# Patient Record
Sex: Female | Born: 1955
Health system: Southern US, Community
[De-identification: ages and names within clinical notes are randomized; demographics above are authoritative.]

## PROBLEM LIST (undated history)

## (undated) ENCOUNTER — Ambulatory Visit: Admission: EM | Payer: Medicare (Managed Care)

## (undated) DIAGNOSIS — I10 Essential (primary) hypertension: Secondary | ICD-10-CM

## (undated) DIAGNOSIS — I639 Cerebral infarction, unspecified: Secondary | ICD-10-CM

## (undated) DIAGNOSIS — E119 Type 2 diabetes mellitus without complications: Secondary | ICD-10-CM

## (undated) DIAGNOSIS — I671 Cerebral aneurysm, nonruptured: Secondary | ICD-10-CM

## (undated) HISTORY — DX: Type 2 diabetes mellitus without complications: E11.9

## (undated) HISTORY — DX: Cerebral infarction, unspecified: I63.9

## (undated) HISTORY — PX: CHOLECYSTECTOMY: SHX55

## (undated) HISTORY — PX: TOTAL ABDOMINAL HYSTERECTOMY: SHX209

## (undated) HISTORY — PX: ABDOMINAL HYSTERECTOMY: SHX81

---

## 2002-05-31 ENCOUNTER — Emergency Department (HOSPITAL_COMMUNITY): Admission: EM | Admit: 2002-05-31 | Discharge: 2002-05-31 | Payer: Self-pay | Admitting: Emergency Medicine

## 2003-05-15 ENCOUNTER — Emergency Department (HOSPITAL_COMMUNITY): Admission: EM | Admit: 2003-05-15 | Discharge: 2003-05-15 | Payer: Self-pay | Admitting: Physical Therapy

## 2003-05-15 ENCOUNTER — Encounter: Payer: Self-pay | Admitting: *Deleted

## 2004-01-23 ENCOUNTER — Emergency Department (HOSPITAL_COMMUNITY): Admission: EM | Admit: 2004-01-23 | Discharge: 2004-01-24 | Payer: Self-pay | Admitting: Emergency Medicine

## 2004-07-28 ENCOUNTER — Emergency Department (HOSPITAL_COMMUNITY): Admission: EM | Admit: 2004-07-28 | Discharge: 2004-07-28 | Payer: Self-pay | Admitting: Family Medicine

## 2005-05-19 ENCOUNTER — Emergency Department (HOSPITAL_COMMUNITY): Admission: AD | Admit: 2005-05-19 | Discharge: 2005-05-19 | Payer: Self-pay | Admitting: Family Medicine

## 2005-09-04 ENCOUNTER — Emergency Department (HOSPITAL_COMMUNITY): Admission: EM | Admit: 2005-09-04 | Discharge: 2005-09-04 | Payer: Self-pay | Admitting: Family Medicine

## 2006-03-06 ENCOUNTER — Emergency Department (HOSPITAL_COMMUNITY): Admission: EM | Admit: 2006-03-06 | Discharge: 2006-03-06 | Payer: Self-pay | Admitting: *Deleted

## 2006-03-06 ENCOUNTER — Emergency Department (HOSPITAL_COMMUNITY): Admission: EM | Admit: 2006-03-06 | Discharge: 2006-03-06 | Payer: Self-pay | Admitting: Emergency Medicine

## 2006-08-13 ENCOUNTER — Emergency Department (HOSPITAL_COMMUNITY): Admission: EM | Admit: 2006-08-13 | Discharge: 2006-08-13 | Payer: Self-pay | Admitting: Family Medicine

## 2006-09-06 ENCOUNTER — Emergency Department (HOSPITAL_COMMUNITY): Admission: EM | Admit: 2006-09-06 | Discharge: 2006-09-06 | Payer: Self-pay | Admitting: Emergency Medicine

## 2007-01-28 ENCOUNTER — Emergency Department (HOSPITAL_COMMUNITY): Admission: EM | Admit: 2007-01-28 | Discharge: 2007-01-28 | Payer: Self-pay | Admitting: Family Medicine

## 2007-06-29 ENCOUNTER — Emergency Department (HOSPITAL_COMMUNITY): Admission: EM | Admit: 2007-06-29 | Discharge: 2007-06-29 | Payer: Self-pay | Admitting: *Deleted

## 2007-08-05 ENCOUNTER — Emergency Department (HOSPITAL_COMMUNITY): Admission: EM | Admit: 2007-08-05 | Discharge: 2007-08-06 | Payer: Self-pay | Admitting: Emergency Medicine

## 2008-01-07 ENCOUNTER — Emergency Department (HOSPITAL_COMMUNITY): Admission: EM | Admit: 2008-01-07 | Discharge: 2008-01-07 | Payer: Self-pay | Admitting: Emergency Medicine

## 2008-08-25 ENCOUNTER — Emergency Department (HOSPITAL_COMMUNITY): Admission: EM | Admit: 2008-08-25 | Discharge: 2008-08-25 | Payer: Self-pay | Admitting: Emergency Medicine

## 2009-08-10 ENCOUNTER — Emergency Department (HOSPITAL_COMMUNITY): Admission: EM | Admit: 2009-08-10 | Discharge: 2009-08-11 | Payer: Self-pay | Admitting: Emergency Medicine

## 2010-01-17 ENCOUNTER — Emergency Department (HOSPITAL_COMMUNITY): Admission: EM | Admit: 2010-01-17 | Discharge: 2010-01-17 | Payer: Self-pay | Admitting: Family Medicine

## 2010-07-08 ENCOUNTER — Emergency Department (HOSPITAL_COMMUNITY): Admission: EM | Admit: 2010-07-08 | Discharge: 2010-07-08 | Payer: Self-pay | Admitting: Emergency Medicine

## 2010-11-15 ENCOUNTER — Emergency Department (HOSPITAL_COMMUNITY)
Admission: EM | Admit: 2010-11-15 | Discharge: 2010-11-15 | Payer: Self-pay | Source: Home / Self Care | Admitting: Emergency Medicine

## 2011-02-14 ENCOUNTER — Ambulatory Visit (INDEPENDENT_AMBULATORY_CARE_PROVIDER_SITE_OTHER): Payer: PRIVATE HEALTH INSURANCE

## 2011-02-14 ENCOUNTER — Emergency Department (HOSPITAL_COMMUNITY): Payer: PRIVATE HEALTH INSURANCE

## 2011-02-14 ENCOUNTER — Inpatient Hospital Stay (INDEPENDENT_AMBULATORY_CARE_PROVIDER_SITE_OTHER)
Admission: RE | Admit: 2011-02-14 | Discharge: 2011-02-14 | Disposition: A | Discharge status: Home / Self Care | Payer: PRIVATE HEALTH INSURANCE | Source: Ambulatory Visit | Attending: Emergency Medicine | Admitting: Emergency Medicine

## 2011-02-14 ENCOUNTER — Emergency Department (HOSPITAL_COMMUNITY)
Admission: EM | Admit: 2011-02-14 | Discharge: 2011-02-14 | Discharge status: Against Medical Advice | Payer: PRIVATE HEALTH INSURANCE | Attending: Emergency Medicine | Admitting: Emergency Medicine

## 2011-02-14 DIAGNOSIS — M545 Low back pain, unspecified: Secondary | ICD-10-CM | POA: Insufficient documentation

## 2011-02-14 DIAGNOSIS — M5430 Sciatica, unspecified side: Secondary | ICD-10-CM

## 2011-02-14 DIAGNOSIS — M47817 Spondylosis without myelopathy or radiculopathy, lumbosacral region: Secondary | ICD-10-CM | POA: Insufficient documentation

## 2011-02-14 DIAGNOSIS — M25559 Pain in unspecified hip: Secondary | ICD-10-CM | POA: Insufficient documentation

## 2011-02-14 DIAGNOSIS — M1990 Unspecified osteoarthritis, unspecified site: Secondary | ICD-10-CM

## 2011-02-14 DIAGNOSIS — E669 Obesity, unspecified: Secondary | ICD-10-CM | POA: Insufficient documentation

## 2011-02-14 DIAGNOSIS — I10 Essential (primary) hypertension: Secondary | ICD-10-CM | POA: Insufficient documentation

## 2011-02-14 DIAGNOSIS — Z79899 Other long term (current) drug therapy: Secondary | ICD-10-CM | POA: Insufficient documentation

## 2011-02-14 DIAGNOSIS — M533 Sacrococcygeal disorders, not elsewhere classified: Secondary | ICD-10-CM | POA: Insufficient documentation

## 2011-02-14 DIAGNOSIS — G8929 Other chronic pain: Secondary | ICD-10-CM | POA: Insufficient documentation

## 2011-02-16 ENCOUNTER — Inpatient Hospital Stay (INDEPENDENT_AMBULATORY_CARE_PROVIDER_SITE_OTHER)
Admission: RE | Admit: 2011-02-16 | Discharge: 2011-02-16 | Disposition: A | Discharge status: Home / Self Care | Payer: PRIVATE HEALTH INSURANCE | Source: Ambulatory Visit | Attending: Emergency Medicine | Admitting: Emergency Medicine

## 2011-02-16 DIAGNOSIS — I10 Essential (primary) hypertension: Secondary | ICD-10-CM

## 2011-02-16 DIAGNOSIS — M5430 Sciatica, unspecified side: Secondary | ICD-10-CM

## 2011-02-28 LAB — URINE MICROSCOPIC-ADD ON

## 2011-02-28 LAB — URINALYSIS, ROUTINE W REFLEX MICROSCOPIC
Glucose, UA: NEGATIVE mg/dL
Protein, ur: NEGATIVE mg/dL
pH: 6.5 (ref 5.0–8.0)

## 2011-03-18 ENCOUNTER — Emergency Department (HOSPITAL_COMMUNITY)
Admission: EM | Admit: 2011-03-18 | Discharge: 2011-03-18 | Disposition: A | Discharge status: Home / Self Care | Payer: PRIVATE HEALTH INSURANCE | Attending: Emergency Medicine | Admitting: Emergency Medicine

## 2011-03-18 DIAGNOSIS — I10 Essential (primary) hypertension: Secondary | ICD-10-CM | POA: Insufficient documentation

## 2011-03-18 DIAGNOSIS — M79609 Pain in unspecified limb: Secondary | ICD-10-CM | POA: Insufficient documentation

## 2011-03-18 DIAGNOSIS — M5430 Sciatica, unspecified side: Secondary | ICD-10-CM | POA: Insufficient documentation

## 2011-05-02 ENCOUNTER — Inpatient Hospital Stay (INDEPENDENT_AMBULATORY_CARE_PROVIDER_SITE_OTHER)
Admission: RE | Admit: 2011-05-02 | Discharge: 2011-05-02 | Disposition: A | Discharge status: Home / Self Care | Payer: PRIVATE HEALTH INSURANCE | Source: Ambulatory Visit | Attending: Family Medicine | Admitting: Family Medicine

## 2011-05-02 DIAGNOSIS — N390 Urinary tract infection, site not specified: Secondary | ICD-10-CM

## 2011-05-02 LAB — POCT URINALYSIS DIP (DEVICE)
Glucose, UA: NEGATIVE mg/dL
Nitrite: NEGATIVE
Protein, ur: >=300 mg/dL — AB
Specific Gravity, Urine: 1.025 (ref 1.005–1.030)
Urobilinogen, UA: 2 mg/dL — ABNORMAL HIGH (ref 0.0–1.0)

## 2011-09-20 LAB — URINALYSIS, ROUTINE W REFLEX MICROSCOPIC
Glucose, UA: NEGATIVE
Ketones, ur: NEGATIVE
Nitrite: NEGATIVE
Protein, ur: NEGATIVE
Urobilinogen, UA: 1

## 2011-09-20 LAB — URINE MICROSCOPIC-ADD ON

## 2011-09-29 LAB — RAPID URINE DRUG SCREEN, HOSP PERFORMED
Amphetamines: NOT DETECTED
Benzodiazepines: NOT DETECTED
Cocaine: NOT DETECTED

## 2011-09-29 LAB — I-STAT 8, (EC8 V) (CONVERTED LAB)
Acid-Base Excess: 3 — ABNORMAL HIGH
Chloride: 106
Glucose, Bld: 95
Hemoglobin: 15.3 — ABNORMAL HIGH
Potassium: 3.6
Sodium: 141
TCO2: 30
pH, Ven: 7.373 — ABNORMAL HIGH

## 2011-09-29 LAB — URINALYSIS, ROUTINE W REFLEX MICROSCOPIC
Glucose, UA: NEGATIVE
Ketones, ur: NEGATIVE
Protein, ur: NEGATIVE
Urobilinogen, UA: 1

## 2011-09-29 LAB — POCT I-STAT CREATININE
Creatinine, Ser: 0.8
Operator id: 270651

## 2011-09-29 LAB — ACETAMINOPHEN LEVEL: Acetaminophen (Tylenol), Serum: <10 — ABNORMAL LOW

## 2011-09-30 ENCOUNTER — Emergency Department (HOSPITAL_COMMUNITY)
Admission: EM | Admit: 2011-09-30 | Discharge: 2011-09-30 | Disposition: A | Discharge status: Home / Self Care | Payer: PRIVATE HEALTH INSURANCE | Attending: Emergency Medicine | Admitting: Emergency Medicine

## 2011-09-30 DIAGNOSIS — I10 Essential (primary) hypertension: Secondary | ICD-10-CM | POA: Insufficient documentation

## 2011-09-30 DIAGNOSIS — M1990 Unspecified osteoarthritis, unspecified site: Secondary | ICD-10-CM | POA: Insufficient documentation

## 2011-09-30 DIAGNOSIS — K089 Disorder of teeth and supporting structures, unspecified: Secondary | ICD-10-CM | POA: Insufficient documentation

## 2011-09-30 DIAGNOSIS — K029 Dental caries, unspecified: Secondary | ICD-10-CM | POA: Insufficient documentation

## 2011-09-30 DIAGNOSIS — F411 Generalized anxiety disorder: Secondary | ICD-10-CM | POA: Insufficient documentation

## 2012-03-14 ENCOUNTER — Encounter (HOSPITAL_COMMUNITY): Payer: Self-pay

## 2012-03-14 ENCOUNTER — Telehealth (HOSPITAL_COMMUNITY): Payer: Self-pay | Admitting: *Deleted

## 2012-03-14 ENCOUNTER — Emergency Department (INDEPENDENT_AMBULATORY_CARE_PROVIDER_SITE_OTHER)
Admission: EM | Admit: 2012-03-14 | Discharge: 2012-03-14 | Disposition: A | Discharge status: Home / Self Care | Payer: PRIVATE HEALTH INSURANCE | Source: Home / Self Care | Attending: Emergency Medicine | Admitting: Emergency Medicine

## 2012-03-14 DIAGNOSIS — J069 Acute upper respiratory infection, unspecified: Secondary | ICD-10-CM

## 2012-03-14 DIAGNOSIS — J040 Acute laryngitis: Secondary | ICD-10-CM

## 2012-03-14 HISTORY — DX: Essential (primary) hypertension: I10

## 2012-03-14 MED ORDER — PREDNISONE 20 MG PO TABS: 20.0000 mg | ORAL_TABLET | Freq: Every day | ORAL | Status: AC

## 2012-03-14 MED ORDER — GUAIFENESIN-CODEINE 100-10 MG/5ML PO SYRP: 5.0000 mL | ORAL_SOLUTION | Freq: Three times a day (TID) | ORAL | Status: AC | PRN

## 2012-03-14 NOTE — Discharge Instructions (Signed)
As discussed if your symptoms persist beyond 7 days or changes in her condition should return for further evaluation and recheck. In the meantime take his medicines for your current symptoms and hydrate herself well and use a humidifier at night.   Laryngitis At the top of your windpipe is your voice box. It is the source of your voice. Inside your voice box are 2 bands of muscles called vocal cords. When you breathe, your vocal cords are relaxed and open so that air can get into the lungs. When you decide to say something, these cords come together and vibrate. The sound from these vibrations goes into your throat and comes out through your mouth as sound. Laryngitis is an inflammation of the vocal cords that causes hoarseness, cough, loss of voice, sore throat, and dry throat. Laryngitis can be temporary (acute) or long-term (chronic). Most cases of acute laryngitis improve with time.Chronic laryngitis lasts for more than 3 weeks. CAUSES Laryngitis can often be related to excessive smoking, talking, or yelling, as well as inhalation of toxic fumes and allergies. Acute laryngitis is usually caused by a viral infection, vocal strain, measles or mumps, or bacterial infections. Chronic laryngitis is usually caused by vocal cord strain, vocal cord injury, postnasal drip, growths on the vocal cords, or acid reflux. SYMPTOMS   Cough.   Sore throat.   Dry throat.  RISK FACTORS  Respiratory infections.   Exposure to irritating substances, such as cigarette smoke, excessive amounts of alcohol, stomach acids, and workplace chemicals.   Voice trauma, such as vocal cord injury from shouting or speaking too loud.  DIAGNOSIS  Your cargiver will perform a physical exam. During the physical exam, your caregiver will examine your throat. The most common sign of laryngitis is hoarseness. Laryngoscopy may be necessary to confirm the diagnosis of this condition. This procedure allows your caregiver to look  into the larynx. HOME CARE INSTRUCTIONS  Drink enough fluids to keep your urine clear or pale yellow.   Rest until you no longer have symptoms or as directed by your caregiver.   Breathe in moist air.   Take all medicine as directed by your caregiver.   Do not smoke.   Talk as little as possible (this includes whispering).   Write on paper instead of talking until your voice is back to normal.   Follow up with your caregiver if your condition has not improved after 10 days.  SEEK MEDICAL CARE IF:   You have trouble breathing.   You cough up blood.   You have persistent fever.   You have increasing pain.   You have difficulty swallowing.  MAKE SURE YOU:  Understand these instructions.   Will watch your condition.   Will get help right away if you are not doing well or get worse.  Document Released: 12/04/2005 Document Revised: 11/23/2011 Document Reviewed: 02/09/2011 [facility] Patient Information 2012 [place], [place].Cough, Child A cough is a way the body removes something that bothers the nose, throat, and airway (respiratory tract). It may also be a sign of an illness or disease. HOME CARE  Only give your child medicine as told by his or her doctor.   Avoid anything that causes coughing at school and at home.   Keep your child away from cigarette smoke.   If the air in your home is very dry, a cool mist humidifier may help.   Have your child drink enough fluids to keep their pee (urine) clear of pale yellow.  GET HELP RIGHT AWAY IF:  Your child is short of breath.   Your child's lips turn blue or are a color that is not normal.   Your child coughs up blood.   You think your child may have choked on something.   Your child complains of chest or belly (abdominal) pain with breathing or coughing.   Your baby is 39 months old or younger with a rectal temperature of 100.4 F (38 C) or higher.   Your child makes whistling sounds (wheezing) or sounds hoarse  when breathing (stridor) or has a barky cough.   Your child has new problems (symptoms).   Your child's cough gets worse.   The cough wakes your child from sleep.   Your child still has a cough in 2 weeks.   Your child throws up (vomits) from the cough.   Your child's fever returns after it has gone away for 24 hours.   Your child's fever gets worse after 3 days.   Your child starts to sweat a lot at night (night sweats).  MAKE SURE YOU:   Understand these instructions.   Will watch your child's condition.   Will get help right away if your child is not doing well or gets worse.  Document Released: 08/16/2011 Document Revised: 11/23/2011 Document Reviewed: 08/16/2011 [facility] Patient Information 2012 [place], [place].

## 2012-03-14 NOTE — ED Notes (Signed)
[facility] pharmacy called with questions about Rx. of Prednisone. It says take 1 tablet by mouth daily. 2 tablets daily for 5 days #5. Printout shown to Dr. Ladon Applebaum and he said it should be Prednisone 20 mg. Take 2 tablets ( 40 mg.)  po daily x 5 days #10.  Pharmacy tech given the information. Renee Murray 03/14/2012

## 2012-03-14 NOTE — ED Provider Notes (Signed)
History     CSN: [id number]  Arrival date & time 03/14/12  0865   First MD Initiated Contact with Patient 03/14/12 [phone]      Chief Complaint  Patient presents with  . URI    (Consider location/radiation/quality/duration/timing/severity/associated sxs/prior treatment) HPI Comments: Patient presents urgent care with voice change, nasal congestion or sore throat ear pain with cough and phlegm no fevers, no shortness of breath has had some pressure in both of these ears.  Patient is a 56 y.o. female presenting with URI. The history is provided by the patient.  URI The primary symptoms include fever, ear pain and cough. Primary symptoms do not include myalgias, arthralgias or rash. The current episode started 2 days ago. This is a new problem. The problem has not changed since onset. Symptoms associated with the illness include chills, sinus pressure, congestion and rhinorrhea.    Past Medical History  Diagnosis Date  . Hypertension     Past Surgical History  Procedure Date  . Cholecystectomy   . Abdominal hysterectomy     History reviewed. No pertinent family history.  History  Substance Use Topics  . Smoking status: Current Everyday Smoker -- 0.5 packs/day  . Smokeless tobacco: Not on file  . Alcohol Use: No    OB History    Grav Para Term Preterm Abortions TAB SAB Ect Mult Living                  Review of Systems  Constitutional: Positive for fever and chills.  HENT: Positive for ear pain, congestion, rhinorrhea and sinus pressure.   Respiratory: Positive for cough.   Musculoskeletal: Negative for myalgias and arthralgias.  Skin: Negative for rash.    Allergies  Demerol and Other  Home Medications   Current Outpatient Rx  Name Route Sig Dispense Refill  . PRESCRIPTION MEDICATION  BP medication    . GUAIFENESIN-CODEINE 100-10 MG/5ML PO SYRP Oral Take 5 mLs by mouth 3 (three) times daily as needed for cough or congestion. 120 mL 0  . PREDNISONE 20 MG PO  TABS Oral Take 1 tablet (20 mg total) by mouth daily. 2 tablets daily for 5 days 5 tablet 0    BP 144/84  Pulse 72  Temp(Src) 98.2 F (36.8 C) (Oral)  Resp 14  SpO2 98%  Physical Exam  Nursing note and vitals reviewed. Constitutional: She appears well-developed and well-nourished.  HENT:  Head: Normocephalic.  Eyes: Conjunctivae are normal. Right eye exhibits no discharge. Left eye exhibits no discharge.  Neck: Neck supple.  Cardiovascular: Normal rate.   Pulmonary/Chest: Effort normal and breath sounds normal. She has no decreased breath sounds.  Abdominal: Soft. She exhibits no distension. There is no tenderness. There is no rigidity, no guarding, no CVA tenderness, no tenderness at McBurney's point and negative Murphy's sign.  Skin: No erythema.    ED Course  Procedures (including critical care time)  Labs Reviewed - No data to display No results found.   1. Upper respiratory infection   2. Laryngitis       MDM  Laryngitis with coexistent upper respiratory symptoms symptoms. Symptomatic management recommended for the next 2-3 days patient advised to return if further symptoms or no improvement        Jimmie Molly, MD 03/14/12 1650

## 2012-03-14 NOTE — ED Notes (Signed)
C/o hoarseness, post nasal drainage, sore throat , ear pain and productive cough for 3 days.  Denies fever.

## 2012-11-04 ENCOUNTER — Encounter (HOSPITAL_COMMUNITY): Payer: Self-pay | Admitting: Emergency Medicine

## 2012-11-04 ENCOUNTER — Emergency Department (HOSPITAL_COMMUNITY)
Admission: EM | Admit: 2012-11-04 | Discharge: 2012-11-04 | Disposition: A | Discharge status: Home / Self Care | Payer: PRIVATE HEALTH INSURANCE | Attending: Emergency Medicine | Admitting: Emergency Medicine

## 2012-11-04 DIAGNOSIS — IMO0001 Reserved for inherently not codable concepts without codable children: Secondary | ICD-10-CM | POA: Insufficient documentation

## 2012-11-04 DIAGNOSIS — I7300 Raynaud's syndrome without gangrene: Secondary | ICD-10-CM

## 2012-11-04 DIAGNOSIS — F17200 Nicotine dependence, unspecified, uncomplicated: Secondary | ICD-10-CM | POA: Insufficient documentation

## 2012-11-04 DIAGNOSIS — I10 Essential (primary) hypertension: Secondary | ICD-10-CM | POA: Insufficient documentation

## 2012-11-04 MED ORDER — NAPROXEN 375 MG PO TABS: 375.0000 mg | ORAL_TABLET | Freq: Two times a day (BID) | ORAL | Status: DC

## 2012-11-04 NOTE — ED Notes (Signed)
Swollen hand s x 1 month states they  Change color and they swell wake up w/ pain that rads up arm to elbow has to sleeping sitting  Up no injury to hands and having neck pain

## 2012-11-04 NOTE — ED Provider Notes (Signed)
History  This chart was scribed for Carleene Cooper III, MD by Shari Heritage, ED Scribe. The patient was seen in room TR11C/TR11C. Patient's care was started at 1527.  CSN: [id number]  Arrival date & time 11/04/12  1249   First MD Initiated Contact with Patient 11/04/12 1527     Chief Complaint  Patient presents with  . Hand Pain    The history is provided by the patient. No language interpreter was used.    HPI Comments: Renee Murray is a 56 y.o. female who presents to the Emergency Department complaining of moderate to severe, intermittent, shooting, left hand pain that occasionally radiates up to her axilla onset 1-2 months ago. There is associated swelling and color change to her hand. Patient is asymptomatic at this time. Patient states that she notices a blue-purple color to her fingers and increased swelling especially after she wakes up in the morning. Patient states that symptoms are more severe while she is lying down, and that they improve when she is active. Patient denies any obvious injury to her hand or arm. Patient has a medical history of HTN. She states that she checks her blood pressure regularly and she is not currently taking medication. Patient has a medical history of cholecystectomy and abdominal hysterectomy. Patient states that she had bad reaction to demerol during a past surgery. Patient smokes daily.  No PCP  Past Medical History  Diagnosis Date  . Hypertension     Past Surgical History  Procedure Date  . Cholecystectomy   . Abdominal hysterectomy     No family history on file.  History  Substance Use Topics  . Smoking status: Current Every Day Smoker -- 0.5 packs/day  . Smokeless tobacco: Not on file  . Alcohol Use: No    OB History    Grav Para Term Preterm Abortions TAB SAB Ect Mult Living                  Review of Systems  Musculoskeletal: Positive for myalgias.  Skin: Positive for color change.  All other systems reviewed and are  negative.    Allergies  Demerol and Other  Home Medications   Current Outpatient Rx  Name  Route  Sig  Dispense  Refill  . NAPROXEN SODIUM 220 MG PO TABS   Oral   Take 440 mg by mouth 2 (two) times daily as needed. For headache           Triage Vitals: BP 148/78  Pulse 67  Temp 98.3 F (36.8 C) (Oral)  Resp 16  SpO2 99%  Physical Exam  Nursing note and vitals reviewed. Constitutional: She is oriented to person, place, and time. She appears well-developed and well-nourished. No distress.  HENT:  Head: Normocephalic and atraumatic.  Eyes: EOM are normal. Pupils are equal, round, and reactive to light.  Neck: Neck supple. No tracheal deviation present.  Cardiovascular: Normal rate.   Pulmonary/Chest: Effort normal. No respiratory distress.  Abdominal: Soft. She exhibits no distension.  Musculoskeletal: Normal range of motion. She exhibits no edema.  Neurological: She is alert and oriented to person, place, and time. No sensory deficit.  Skin: Skin is warm and dry.  Psychiatric: She has a normal mood and affect. Her behavior is normal.    ED Course  Procedures (including critical care time) DIAGNOSTIC STUDIES: Oxygen Saturation is 99% on room air, normal by my interpretation.    COORDINATION OF CARE: 3:54 PM- Patient informed of current plan for  treatment and evaluation and agrees with plan at this time. 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 Will prescribe naproxen 375 mg and discharge patient home. Also instructed patient to wear gloves during cold weather and to sleep to improve circulation.  1. Raynaud's disease /phenomenon     Rx with naproxen, protect hands from cold.  I personally performed the services described in this documentation, which was scribed in my presence. The recorded information has been reviewed and is accurate.  Osvaldo Human, MD     Carleene Cooper III, MD 11/04/12 [phone]

## 2013-03-11 ENCOUNTER — Other Ambulatory Visit: Payer: Self-pay | Admitting: *Deleted

## 2013-08-01 ENCOUNTER — Ambulatory Visit: Payer: PRIVATE HEALTH INSURANCE | Admitting: Internal Medicine

## 2013-08-06 ENCOUNTER — Ambulatory Visit (INDEPENDENT_AMBULATORY_CARE_PROVIDER_SITE_OTHER): Payer: 59 | Admitting: Internal Medicine

## 2013-08-06 ENCOUNTER — Encounter: Payer: Self-pay | Admitting: Internal Medicine

## 2013-08-06 VITALS — BP 139/73 | HR 69 | Temp 97.9°F | Ht 59.0 in | Wt 145.2 lb

## 2013-08-06 DIAGNOSIS — G609 Hereditary and idiopathic neuropathy, unspecified: Secondary | ICD-10-CM

## 2013-08-06 DIAGNOSIS — G629 Polyneuropathy, unspecified: Secondary | ICD-10-CM

## 2013-08-06 DIAGNOSIS — G589 Mononeuropathy, unspecified: Secondary | ICD-10-CM

## 2013-08-06 DIAGNOSIS — G5690 Unspecified mononeuropathy of unspecified upper limb: Secondary | ICD-10-CM | POA: Insufficient documentation

## 2013-08-06 DIAGNOSIS — R300 Dysuria: Secondary | ICD-10-CM

## 2013-08-06 LAB — COMPLETE METABOLIC PANEL WITH GFR
ALT: 13 U/L (ref 0–35)
AST: 15 U/L (ref 0–37)
CO2: 27 mEq/L (ref 19–32)
Creat: 0.68 mg/dL (ref 0.50–1.10)
Glucose, Bld: 112 mg/dL — ABNORMAL HIGH (ref 70–99)
Potassium: 3.9 mEq/L (ref 3.5–5.3)
Sodium: 141 mEq/L (ref 135–145)
Total Bilirubin: 0.3 mg/dL (ref 0.3–1.2)
Total Protein: 6.7 g/dL (ref 6.0–8.3)

## 2013-08-06 LAB — CBC WITH DIFFERENTIAL/PLATELET
Basophils Absolute: 0 10*3/uL (ref 0.0–0.1)
Eosinophils Absolute: 0.3 10*3/uL (ref 0.0–0.7)
HCT: 40.9 % (ref 36.0–46.0)
Hemoglobin: 13.4 g/dL (ref 12.0–15.0)
Lymphocytes Relative: 52 % — ABNORMAL HIGH (ref 12–46)
Lymphs Abs: 3.5 10*3/uL (ref 0.7–4.0)
MCH: 29.5 pg (ref 26.0–34.0)
MCV: 90.1 fL (ref 78.0–100.0)
Monocytes Absolute: 0.5 10*3/uL (ref 0.1–1.0)
Monocytes Relative: 7 % (ref 3–12)
Neutro Abs: 2.4 10*3/uL (ref 1.7–7.7)
Platelets: 206 10*3/uL (ref 150–400)
RBC: 4.54 MIL/uL (ref 3.87–5.11)
WBC: 6.6 10*3/uL (ref 4.0–10.5)

## 2013-08-06 MED ORDER — CYCLOBENZAPRINE HCL 5 MG PO TABS: 5.0000 mg | ORAL_TABLET | Freq: Every day | ORAL | Status: DC | PRN

## 2013-08-06 NOTE — Patient Instructions (Addendum)
Return to clinic in 1 month  1. Take flexeril 5mg  1 tablet daily as needed for muscle spasm 2. Take ibuprofen 400mg  every 6 hours as needed for pain

## 2013-08-06 NOTE — Progress Notes (Signed)
Patient ID: Renee Murray, female   DOB: [birth year], 57 y.o.   MRN: [id number]     Subjective:   Patient ID: Renee Murray female   DOB: [birth year] 57 y.o.   MRN: [id number]  HPI: [name] is a 57 y.o. AAF here for an acute visit for numbness and pain in her hands.  She is a new patient to me.  For details please see problem based assessment and plan charting below.   Past Medical History  Diagnosis Date  . Hypertension    Current Outpatient Prescriptions  Medication Sig Dispense Refill  . cyclobenzaprine (FLEXERIL) 5 MG tablet Take 1 tablet (5 mg total) by mouth daily as needed for muscle spasms.  30 tablet  0  . naproxen (NAPROSYN) 375 MG tablet Take 1 tablet (375 mg total) by mouth 2 (two) times daily.  30 tablet  2  . naproxen sodium (ANAPROX) 220 MG tablet Take 440 mg by mouth 2 (two) times daily as needed. For headache       No current facility-administered medications for this visit.   No family history on file. History   Social History  . Marital Status: Divorced    Spouse Name: N/A    Number of Children: N/A  . Years of Education: N/A   Social History Main Topics  . Smoking status: Current Every Day Smoker -- 0.50 packs/day for 20 years  . Smokeless tobacco: None  . Alcohol Use: Yes     Comment: socially  . Drug Use: No  . Sexual Activity: None   Other Topics Concern  . None   Social History Narrative  . None   Review of Systems:  Review of Systems  Constitutional: Negative for fever, chills and weight loss.  Eyes: Negative for blurred vision.  Respiratory: Negative for cough and shortness of breath.   Cardiovascular: Negative for chest pain and palpitations.  Gastrointestinal: Negative for heartburn, nausea, vomiting, abdominal pain, diarrhea and constipation.  Genitourinary: Positive for dysuria.  Neurological: Negative for dizziness, weakness and headaches.  Psychiatric/Behavioral: The patient is nervous/anxious.     Objective:    Physical Exam: Filed Vitals:   08/06/13 1522  BP: 139/73  Pulse: 69  Temp: 97.9 F (36.6 C)  TempSrc: Oral  Height: 4\' 11"  (1.499 m)  Weight: 145 lb 3.2 oz (65.862 kg)  SpO2: 98%   Physical Exam  Constitutional: She is oriented to person, place, and time and well-developed, well-nourished, and in no distress.  HENT:  Head: Normocephalic and atraumatic.  Eyes: Conjunctivae are normal. Pupils are equal, round, and reactive to light.  Neck: Neck supple.  Cardiovascular: Normal rate, regular rhythm, normal heart sounds and intact distal pulses.   Pulmonary/Chest: Effort normal and breath sounds normal. No respiratory distress.  Abdominal: Soft. Bowel sounds are normal. There is no tenderness.  Musculoskeletal: Normal range of motion.       Right shoulder: Normal.       Left shoulder: Normal.       Right forearm: Normal.       Left forearm: Normal.       Right hand: Normal.       Left hand: Normal.  b/l tenderness to palpation posterior to the axilla  Neurological: She is alert and oriented to person, place, and time. No cranial nerve deficit.  Skin: Skin is warm and dry.    Assessment & Plan:

## 2013-08-06 NOTE — Assessment & Plan Note (Signed)
Pt has been having bilateral numbness and discoloration of her hands for the past several years that has gotten worse over time.  She states she was diagnosed with carpel tunnel in past and was prescribed wrist splints and wore them but they did not help.  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  The pain is worse when she lifts her arms above her head and is mainly inferior to her axilla bilaterally.  The numbness and tingling is mainly confined to her hands and is worse when she lays down and improves with standing.  She states her hands turn blue also when they go numb.  She denies any numbness, tingling, or pain in her feet.  She has tried OTC ibuprofen but reports this makes her sleepy.  Additionally,she reports pain inferior to her axilla b/l that is worse when she raises her hands over her head.  On physical exam she has nl UE strength, sensation, reflexes, and 2+radial pulses b/l; negative tinel's sign.  She has tenderness to palpation b/l inferior to the axilla.  She states that this is creating a lot of anxiety for her and she is not getting enough sleep.  Possibilities include carpel tunnel, also hypothyroidism causes fluid retention resulting in swelling that exerts pressure on peripheral nerves.  Additionally she complains of cold intolerance and increased anxiety which support hypothyroidism.  Vitamin B 12 is also a possible but you would expect her to have similar symptoms in the feet.  She denies any alcohol use as B12 and alcohol are two of the most common causes of peripheral neuropathy.  DM can also cause peripheral neuropathy.  Raynaud's is possible but less likely because she does not complain of worsening symptoms in the cold.  The pain under her axilla may be related to bilateral rotator cuff tendinopathy, especially she does repetitive motions and the pain is worse with overhead lifting.   Rotator cuff  tendinopathy is also worse at night and when you lay down.  -check CMET, TSH, B12, Cbc/Diff -trial of flexeril 5mg  for muscle spasm -wear wrist splints -take ibuprofen as needed for pain and try a heating pad -f/u

## 2013-08-07 ENCOUNTER — Other Ambulatory Visit: Payer: Self-pay | Admitting: Internal Medicine

## 2013-08-07 ENCOUNTER — Telehealth: Payer: Self-pay | Admitting: Internal Medicine

## 2013-08-07 LAB — URINALYSIS, ROUTINE W REFLEX MICROSCOPIC
Bilirubin Urine: NEGATIVE
Glucose, UA: NEGATIVE mg/dL
Ketones, ur: NEGATIVE mg/dL
Specific Gravity, Urine: 1.021 (ref 1.005–1.030)
Urobilinogen, UA: 1 mg/dL (ref 0.0–1.0)
pH: 6 (ref 5.0–8.0)

## 2013-08-07 LAB — URINALYSIS, MICROSCOPIC ONLY
Casts: NONE SEEN
Crystals: NONE SEEN
Squamous Epithelial / LPF: NONE SEEN

## 2013-08-07 LAB — TSH: TSH: 0.738 u[IU]/mL (ref 0.350–4.500)

## 2013-08-07 MED ORDER — SULFAMETHOXAZOLE-TMP DS 800-160 MG PO TABS: 1.0000 | ORAL_TABLET | Freq: Two times a day (BID) | ORAL | Status: DC

## 2013-08-07 NOTE — Progress Notes (Signed)
I saw and evaluated the patient.  I personally confirmed the key portions of the history and exam documented by Dr. Delane Ginger and I reviewed pertinent patient test results.  The assessment, diagnosis, and plan were formulated together and I agree with the documentation in the resident's note. Ms Govan seemed to have three different issues today :  1. B hand numbness - I agree with Dr Delane Ginger seems most likely 2/2 CTS 2. B hands turning dark - she describes this both as darkening of natural skin pigmentation but then other times as "blue". Gloves seem to help so encouraged freq use of gloves while asleep 3. B axilla pain - not certain of etiology. Agree with trial of muscle spasms as muscles tight on exam. No LAD.

## 2013-08-15 ENCOUNTER — Telehealth: Payer: Self-pay | Admitting: Internal Medicine

## 2013-09-05 ENCOUNTER — Ambulatory Visit: Payer: PRIVATE HEALTH INSURANCE | Admitting: Internal Medicine

## 2013-09-05 ENCOUNTER — Encounter: Payer: Self-pay | Admitting: Internal Medicine

## 2013-09-08 ENCOUNTER — Telehealth: Payer: Self-pay | Admitting: Internal Medicine

## 2013-09-08 NOTE — Telephone Encounter (Signed)
Patient calls and state that bouts of URI have been erupting in the nursing home that she thinks she caught. Initially she states she was more sick and now still has a hacking cough and she has been struggling with it for 3 weeks. She is coughing up some white to clear phlegm and wants to be evaluated.  She has no fevers or SOB. She has no SOB with exertion. She is requesting an antibiotic however I asked if she could come to the clinic tomorrow for evaluation and she was agreeable. She is not sure but thinks she could come straight from work and arrive at clinic around 4:00 PM. I will route this message to her PCP and to the front desk to see if this can be accommodated.   I advised her that if she is unable to make it to clinic she should call the clinic for appointment when she can come.  She was advised that she should seek immediate medical attention if she has worsening cough, SOB, fevers. She knows that she can always call back.   Genella Mech 6:55 PM 09/08/2013

## 2013-09-09 ENCOUNTER — Encounter: Payer: PRIVATE HEALTH INSURANCE | Admitting: Internal Medicine

## 2013-09-27 ENCOUNTER — Emergency Department (INDEPENDENT_AMBULATORY_CARE_PROVIDER_SITE_OTHER)
Admission: EM | Admit: 2013-09-27 | Discharge: 2013-09-27 | Disposition: A | Discharge status: Home / Self Care | Payer: 59 | Source: Home / Self Care | Attending: Family Medicine | Admitting: Family Medicine

## 2013-09-27 ENCOUNTER — Encounter (HOSPITAL_COMMUNITY): Payer: Self-pay | Admitting: Emergency Medicine

## 2013-09-27 DIAGNOSIS — J309 Allergic rhinitis, unspecified: Secondary | ICD-10-CM

## 2013-09-27 DIAGNOSIS — R519 Headache, unspecified: Secondary | ICD-10-CM

## 2013-09-27 DIAGNOSIS — R51 Headache: Secondary | ICD-10-CM

## 2013-09-27 MED ORDER — CETIRIZINE HCL 10 MG PO TABS: 10.0000 mg | ORAL_TABLET | Freq: Every day | ORAL | Status: DC

## 2013-09-27 MED ORDER — FLUTICASONE PROPIONATE 50 MCG/ACT NA SUSP: 2.0000 | Freq: Every day | NASAL | Status: DC

## 2013-09-27 NOTE — ED Notes (Signed)
C/o high bp

## 2013-09-27 NOTE — ED Provider Notes (Signed)
CSN: [id number]     Arrival date & time 09/27/13  1242 History   First MD Initiated Contact with Patient 09/27/13 1355     Chief Complaint  Patient presents with  . Hypertension   (Consider location/radiation/quality/duration/timing/severity/associated sxs/prior Treatment)  HPI  States she is presenting today because her blood pressure when she checked it this morning was elevated in the 180/90 range. Patient states that she had a headache that was "pounding" in nature with mild dizziness. Reports a history of taking blood pressure medication which she believes was hydrochlorothiazide at the lowest dose which she just never refilled.  He is in states she has been referred or ready to help him wellness Center and is to followup with them regarding her blood pressure in the near future.  Patient states that since has been here waiting she has "rested" and that her headache is no longer pounding in nature patient reports just a "dull" frontal headache with no dizziness. Patient states that in the past when her blood pressure was elevated she would feel dizzy until she rested or took her medication.  Past Medical History  Diagnosis Date  . Hypertension    Past Surgical History  Procedure Laterality Date  . Cholecystectomy    . Abdominal hysterectomy     History reviewed. No pertinent family history. History  Substance Use Topics  . Smoking status: Current Every Day Smoker -- 0.50 packs/day for 20 years  . Smokeless tobacco: Not on file  . Alcohol Use: Yes     Comment: socially   OB History   Grav Para Term Preterm Abortions TAB SAB Ect Mult Living                 Review of Systems  Constitutional: Negative.   HENT: Positive for congestion. Negative for ear pain and sore throat.        Patient reports cough and congestion x 3 weeks.  Eyes: Negative.   Respiratory: Negative.   Cardiovascular: Negative.   Gastrointestinal: Negative.   Endocrine: Negative.   Genitourinary:  Negative.   Musculoskeletal: Negative.   Skin: Negative.   Allergic/Immunologic: Positive for environmental allergies.       States is not sure what she is allergic to but used to take Claritin for seasonal allergies.  Neurological: Positive for dizziness and headaches. Negative for tremors, seizures, syncope, facial asymmetry, speech difficulty, weakness, light-headedness and numbness.  Hematological: Negative.   Psychiatric/Behavioral: Negative.     Allergies  Demerol and Other  Home Medications   Current Outpatient Rx  Name  Route  Sig  Dispense  Refill  . cetirizine (ZYRTEC ALLERGY) 10 MG tablet   Oral   Take 1 tablet (10 mg total) by mouth daily.   30 tablet   2   . cyclobenzaprine (FLEXERIL) 5 MG tablet   Oral   Take 1 tablet (5 mg total) by mouth daily as needed for muscle spasms.   30 tablet   0   . fluticasone (FLONASE) 50 MCG/ACT nasal spray   Nasal   Place 2 sprays into the nose daily.   16 g   2   . sulfamethoxazole-trimethoprim (BACTRIM DS) 800-160 MG per tablet   Oral   Take 1 tablet by mouth every 12 (twelve) hours.   10 tablet   0    BP 152/61  Pulse 65  Temp(Src) 98 F (36.7 C) (Oral)  Resp 18  SpO2 97%  Physical Exam  Nursing note and vitals reviewed. Constitutional:  She is oriented to person, place, and time. She appears well-developed and well-nourished. No distress.  HENT:  Head: Normocephalic and atraumatic.  Right Ear: External ear normal.  Left Ear: External ear normal.  Bilateral nasal turbinates swollen and boggy but patent.  Bilateral tympanic membranes pearly-gray with serous fluid noted behind membranes with distorted light reflex.  Eyes: Conjunctivae and EOM are normal. Pupils are equal, round, and reactive to light. Right eye exhibits no discharge. Left eye exhibits no discharge. No scleral icterus.  Neck: Normal range of motion. Neck supple. No tracheal deviation present.  Cardiovascular: Normal rate, normal heart sounds and  intact distal pulses.  Exam reveals no gallop and no friction rub.   No murmur heard. No pedal edema noted.  Pulmonary/Chest: Effort normal and breath sounds normal. No stridor. No respiratory distress. She has no wheezes. She has no rales. She exhibits no tenderness.  Musculoskeletal: Normal range of motion.  Lymphadenopathy:    She has no cervical adenopathy.  Neurological: She is alert and oriented to person, place, and time. She has normal reflexes. She displays normal reflexes. No cranial nerve deficit. She exhibits normal muscle tone. Coordination normal.  Skin: Skin is warm and dry. No rash noted. She is not diaphoretic. No erythema. No pallor.  Psychiatric: She has a normal mood and affect. Her behavior is normal.    ED Course  Procedures (including critical care time) Labs Review Labs Reviewed - No data to display Imaging Review No results found.   MDM   1. Allergic sinusitis   2. Sinus headache    Meds ordered this encounter  Medications  . fluticasone (FLONASE) 50 MCG/ACT nasal spray    Sig: Place 2 sprays into the nose daily.    Dispense:  16 g    Refill:  2  . cetirizine (ZYRTEC ALLERGY) 10 MG tablet    Sig: Take 1 tablet (10 mg total) by mouth daily.    Dispense:  30 tablet    Refill:  2   He should verbalizes understanding of plan of care patient to return to emergency department immediately with reports of dizziness, weakness or other stroke symptoms.  Patient to follow up with [facility] regarding her blood pressure medication.     Weber Cooks, NP 09/27/13 1519

## 2013-09-28 NOTE — ED Provider Notes (Signed)
Medical screening examination/treatment/procedure(s) were performed by resident physician or non-physician practitioner and as supervising physician I was immediately available for consultation/collaboration.   Barkley Bruns MD.   Linna Hoff, MD 09/28/13 [phone]

## 2014-03-09 ENCOUNTER — Encounter: Payer: PRIVATE HEALTH INSURANCE | Admitting: Internal Medicine

## 2014-03-17 ENCOUNTER — Encounter: Payer: Self-pay | Admitting: Internal Medicine

## 2014-03-17 ENCOUNTER — Ambulatory Visit (HOSPITAL_COMMUNITY)
Admission: RE | Admit: 2014-03-17 | Discharge: 2014-03-17 | Disposition: A | Discharge status: Home / Self Care | Payer: 59 | Source: Ambulatory Visit | Attending: Internal Medicine | Admitting: Internal Medicine

## 2014-03-17 ENCOUNTER — Ambulatory Visit (INDEPENDENT_AMBULATORY_CARE_PROVIDER_SITE_OTHER): Payer: 59 | Admitting: Internal Medicine

## 2014-03-17 VITALS — BP 147/82 | HR 88 | Temp 98.4°F | Ht 59.0 in | Wt 148.2 lb

## 2014-03-17 DIAGNOSIS — M5410 Radiculopathy, site unspecified: Secondary | ICD-10-CM

## 2014-03-17 DIAGNOSIS — F17200 Nicotine dependence, unspecified, uncomplicated: Secondary | ICD-10-CM

## 2014-03-17 DIAGNOSIS — M169 Osteoarthritis of hip, unspecified: Secondary | ICD-10-CM | POA: Insufficient documentation

## 2014-03-17 DIAGNOSIS — M1610 Unilateral primary osteoarthritis, unspecified hip: Secondary | ICD-10-CM | POA: Insufficient documentation

## 2014-03-17 DIAGNOSIS — Z716 Tobacco abuse counseling: Secondary | ICD-10-CM

## 2014-03-17 DIAGNOSIS — R300 Dysuria: Secondary | ICD-10-CM

## 2014-03-17 DIAGNOSIS — Z0000 Encounter for general adult medical examination without abnormal findings: Secondary | ICD-10-CM | POA: Insufficient documentation

## 2014-03-17 DIAGNOSIS — G5690 Unspecified mononeuropathy of unspecified upper limb: Secondary | ICD-10-CM

## 2014-03-17 DIAGNOSIS — R3589 Other polyuria: Secondary | ICD-10-CM

## 2014-03-17 DIAGNOSIS — M25559 Pain in unspecified hip: Secondary | ICD-10-CM | POA: Insufficient documentation

## 2014-03-17 DIAGNOSIS — R358 Other polyuria: Secondary | ICD-10-CM

## 2014-03-17 DIAGNOSIS — I10 Essential (primary) hypertension: Secondary | ICD-10-CM | POA: Insufficient documentation

## 2014-03-17 DIAGNOSIS — Z720 Tobacco use: Secondary | ICD-10-CM | POA: Insufficient documentation

## 2014-03-17 DIAGNOSIS — G629 Polyneuropathy, unspecified: Secondary | ICD-10-CM

## 2014-03-17 LAB — HEMOGLOBIN A1C
Hgb A1c MFr Bld: 5.8 % — ABNORMAL HIGH (ref <5.7)
Mean Plasma Glucose: 120 mg/dL — ABNORMAL HIGH (ref <117)

## 2014-03-17 MED ORDER — AMLODIPINE BESYLATE 5 MG PO TABS: 5.0000 mg | ORAL_TABLET | Freq: Every day | ORAL | Status: DC

## 2014-03-17 NOTE — Assessment & Plan Note (Signed)
BP Readings from Last 3 Encounters:  03/17/14 147/82  09/27/13 152/61  08/06/13 139/73    Lab Results  Component Value Date   NA 141 08/06/2013   K 3.9 08/06/2013   CREATININE 0.68 08/06/2013    Assessment: Blood pressure control: mildly elevated Progress toward BP goal:  deteriorated Comments: pt reports her SBP at work gets up to 190's at times  Plan: Medications:  added amlodipine 5mg  daily today Other plans: will f/u in 1 month for BP recheck since beginning amlodipine

## 2014-03-17 NOTE — Patient Instructions (Signed)
Thank you for your visit today.    Please return to the internal medicine clinic in 1 month(s) or sooner if needed.      I have sent your prescription for amlodipine 5mg  to your pharmacy for blood pressure-please take 1 pill daily.  You may take naproxen for pain 500mg  every 12 hours-do not exceed this amount.  Please try to wear your wrist splints consistently every night.  I will also check you for diabetes.    I have ordered an x-ray of your right hip-you may get this done at radiology on the 2nd floor.  I will also take a urine sample.    I have made the following referrals for you: None   You need the following test(s) for regular health maintenance: mammogram, colonoscopy.    Please be sure to bring all of your medications with you to every visit; this includes herbal supplements, vitamins, eye drops, and any over-the-counter medications.    Should you have any questions regarding your medications and/or any new or worsening symptoms, please be sure to call the clinic at [phone].    If you believe that you are suffering from a life threatening condition or one that may result in the loss of limb or function, then you should call 911 or proceed to the nearest Emergency Department.     A healthy lifestyle and preventative care can promote health and wellness.   Maintain regular health, dental, and eye exams.  Eat a healthy diet. Foods like vegetables, fruits, whole grains, low-fat dairy products, and lean protein foods contain the nutrients you need without too many calories. Decrease your intake of foods high in solid fats, added sugars, and salt. Get information about a proper diet from your caregiver, if necessary.  Regular physical exercise is one of the most important things you can do for your health. Most adults should get at least 150 minutes of moderate-intensity exercise (any activity that increases your heart rate and causes you to sweat) each week. In  addition, most adults need muscle-strengthening exercises on 2 or more days a week.   Maintain a healthy weight. The body mass index (BMI) is a screening tool to identify possible weight problems. It provides an estimate of body fat based on height and weight. Your caregiver can help determine your BMI, and can help you achieve or maintain a healthy weight. For adults 20 years and older:  A BMI below 18.5 is considered underweight.  A BMI of 18.5 to 24.9 is normal.  A BMI of 25 to 29.9 is considered overweight.  A BMI of 30 and above is considered obese.

## 2014-03-17 NOTE — Assessment & Plan Note (Signed)
-  pt counseled to quit smoking; she reports smoking less and is also using nicotine gum which helps -asked about zyban but did not have time to address this today -address starting zyban at next visit

## 2014-03-17 NOTE — Progress Notes (Signed)
Patient ID: Renee Murray, female   DOB: 01/14/1956, 58 y.o.   MRN: [id number]    Subjective:   Patient ID: Renee Murray female    DOB: 01/21/1956 58 y.o.    MRN: [id number] Health Maintenance Due: Health Maintenance Due  Topic Date Due  . Tetanus/tdap  03/24/1973  . Mammogram  03/24/2004  . Colonoscopy  03/24/2004  . Pap Smear  08/24/2012  . Influenza Vaccine  07/18/2013    _________________________________________________   HPI: Renee Murray is a 58 y.o. female here for a routine  visit.  Pt has a PMH outlined below.  Please see problem-based charting assessment and plan note for further details of medical issues addressed at today's visit.  PMH: Past Medical History  Diagnosis Date  . Hypertension     Medications: Current Outpatient Prescriptions on File Prior to Visit  Medication Sig Dispense Refill  . cetirizine (ZYRTEC ALLERGY) 10 MG tablet Take 1 tablet (10 mg total) by mouth daily.  30 tablet  2  . fluticasone (FLONASE) 50 MCG/ACT nasal spray Place 2 sprays into the nose daily.  16 g  2   No current facility-administered medications on file prior to visit.    Allergies: Allergies  Allergen Reactions  . Demerol   . Other     Allergic to something she had for surgery    FH: No family history on file.  SH: History   Social History  . Marital Status: Divorced    Spouse Name: N/A    Number of Children: N/A  . Years of Education: N/A   Social History Main Topics  . Smoking status: Current Every Day Smoker -- 0.50 packs/day for 20 years  . Smokeless tobacco: None  . Alcohol Use: Yes     Comment: socially  . Drug Use: No  . Sexual Activity: None   Other Topics Concern  . None   Social History Narrative  . None    Review of Systems: Constitutional: Negative for fever, chills and weight loss.  Eyes: Negative for blurred vision.  Respiratory: Negative for cough and shortness of breath.  Cardiovascular: Negative for chest pain,  palpitations and leg swelling.  Gastrointestinal: Negative for nausea, vomiting, abdominal pain, diarrhea, constipation and blood in stool.  Genitourinary: Negative for dysuria, urgency and frequency.  Musculoskeletal: Positive for myalgias and back pain.  Neurological: Negative for dizziness, weakness and headaches.     Objective:   Vital Signs: Filed Vitals:   03/17/14 1527  BP: 147/82  Pulse: 88  Temp: 98.4 F (36.9 C)  TempSrc: Oral  Height: 4\' 11"  (1.499 m)  Weight: 148 lb 3.2 oz (67.223 kg)  SpO2: 97%      BP Readings from Last 3 Encounters:  03/17/14 147/82  09/27/13 152/61  08/06/13 139/73    Physical Exam: Constitutional: Vital signs reviewed.  Patient is well-developed and well-nourished in NAD and cooperative with exam.  Head: Normocephalic and atraumatic. Eyes: PERRL, EOMI, conjunctivae nl, no scleral icterus.  Neck: Supple. Cardiovascular: RRR, no MRG. Pulmonary/Chest: normal effort, non-tender to palpation, CTAB, no wheezes, rales, or rhonchi. Abdominal: Soft. NT/ND +BS. Neurological: A&O x3, cranial nerves II-XII are grossly intact, moving all extremities. Extremities: 2+DP b/l; no pitting edema. Skin: Warm, dry and intact. No rash.  Most Recent Laboratory Results:  CMP     Component Value Date/Time   NA 141 08/06/2013 1702   K 3.9 08/06/2013 1702   CL 107 08/06/2013 1702   CO2 27 08/06/2013 1702  GLUCOSE 112* 08/06/2013 1702   BUN 10 08/06/2013 1702   CREATININE 0.68 08/06/2013 1702   CREATININE 0.8 08/06/2007 0013   CALCIUM 9.2 08/06/2013 1702   PROT 6.7 08/06/2013 1702   ALBUMIN 4.1 08/06/2013 1702   AST 15 08/06/2013 1702   ALT 13 08/06/2013 1702   ALKPHOS 80 08/06/2013 1702   BILITOT 0.3 08/06/2013 1702   GFRNONAA >89 08/06/2013 1702   GFRAA >89 08/06/2013 1702    CBC    Component Value Date/Time   WBC 6.6 08/06/2013 1702   RBC 4.54 08/06/2013 1702   HGB 13.4 08/06/2013 1702   HCT 40.9 08/06/2013 1702   PLT 206 08/06/2013 1702   MCV 90.1  08/06/2013 1702   MCH 29.5 08/06/2013 1702   MCHC 32.8 08/06/2013 1702   RDW 14.2 08/06/2013 1702   LYMPHSABS 3.5 08/06/2013 1702   MONOABS 0.5 08/06/2013 1702   EOSABS 0.3 08/06/2013 1702   BASOSABS 0.0 08/06/2013 1702    Lipid Panel No results found for this basename: CHOL,  HDL,  LDLCALC,  LDLDIRECT,  TRIG,  CHOLHDL    HA1C No results found for this basename: HGBA1C    Urinalysis    Component Value Date/Time   COLORURINE YELLOW 08/06/2013 1708   APPEARANCEUR CLEAR 08/06/2013 1708   LABSPEC 1.021 08/06/2013 1708   PHURINE 6.0 08/06/2013 1708   GLUCOSEU NEG 08/06/2013 1708   HGBUR TRACE* 08/06/2013 1708   BILIRUBINUR NEG 08/06/2013 1708   KETONESUR NEG 08/06/2013 1708   PROTEINUR NEG 08/06/2013 1708   UROBILINOGEN 1 08/06/2013 1708   NITRITE POS* 08/06/2013 1708   LEUKOCYTESUR SMALL* 08/06/2013 1708    Urine Microalbumin No results found for this basename: MICROALBUR,  [id number]    Imaging N/A   Assessment & Plan:   Assessment and plan was discussed and formulated with my attending.  Patient should return to the [facility] in 1 month for a BP recheck.

## 2014-03-17 NOTE — Assessment & Plan Note (Addendum)
Pt with dysuria and polyuria; also had dysuria at last visit and diagnosed with UTI and treated with bactrim.  -will check UA/culture and gc/chlamydia  -HA1C

## 2014-03-17 NOTE — Assessment & Plan Note (Addendum)
Pt still c/o of bilateral UE paresthesias of unclear etiology (see previous note).  At last visit, TSH and B12 were checked and were wnl.  CTS is still possible but unlikely given distribution in all fingers bilaterally.  Cervical exam unremarkable without pain and good ROM.  She has normal strength, sensation, pulses bilaterally in the UE.  DMII is also a possibility but highly unlikely.  -will get NCS today -if NCS unrevealing, would consider MRI of cervical spine -check HA1C

## 2014-03-17 NOTE — Assessment & Plan Note (Signed)
-  ordered mammogram -referred for colonoscopy -referred to pap smear clinic on 05/25/14 at [facility]

## 2014-03-18 LAB — GC/CHLAMYDIA PROBE AMP
CT PROBE, AMP APTIMA: NEGATIVE
GC Probe RNA: NEGATIVE

## 2014-03-18 LAB — URINALYSIS W MICROSCOPIC + REFLEX CULTURE
Bilirubin Urine: NEGATIVE
CRYSTALS: NONE SEEN
Casts: NONE SEEN
GLUCOSE, UA: NEGATIVE mg/dL
Ketones, ur: NEGATIVE mg/dL
Nitrite: POSITIVE — AB
Protein, ur: NEGATIVE mg/dL
SPECIFIC GRAVITY, URINE: 1.026 (ref 1.005–1.030)
SQUAMOUS EPITHELIAL / LPF: NONE SEEN
Urobilinogen, UA: 1 mg/dL (ref 0.0–1.0)
pH: 6 (ref 5.0–8.0)

## 2014-03-19 ENCOUNTER — Telehealth: Payer: Self-pay | Admitting: *Deleted

## 2014-03-19 NOTE — Telephone Encounter (Signed)
Pt called and wonders about lab results also when the best time to take BP med would be, she works 3rd shift. Could you please call her when you get a chance. i have reassured her but she would feel better speaking with you

## 2014-03-20 LAB — URINE CULTURE: Colony Count: >=100000

## 2014-03-23 ENCOUNTER — Other Ambulatory Visit: Payer: Self-pay | Admitting: Internal Medicine

## 2014-03-23 DIAGNOSIS — G629 Polyneuropathy, unspecified: Secondary | ICD-10-CM

## 2014-03-25 ENCOUNTER — Other Ambulatory Visit: Payer: Self-pay | Admitting: Internal Medicine

## 2014-03-26 ENCOUNTER — Other Ambulatory Visit: Payer: Self-pay | Admitting: Internal Medicine

## 2014-03-26 ENCOUNTER — Telehealth: Payer: Self-pay | Admitting: *Deleted

## 2014-03-26 MED ORDER — NITROFURANTOIN MONOHYD MACRO 100 MG PO CAPS: 100.0000 mg | ORAL_CAPSULE | Freq: Two times a day (BID) | ORAL | Status: DC

## 2014-03-26 NOTE — Telephone Encounter (Signed)
Pt was called and explained the delay was getting the C/S back.  The prescription was sent to her pharmacy for her UTI. Thanks.

## 2014-03-26 NOTE — Telephone Encounter (Signed)
Pt called in at 1000 this morning stating she was seen in clinic on 3/31 and had not received the results of her test.  She is very upset and asked if she needed to see another doctor to get treated. I pulled up the U/A, C/S and had Dr Dalphine HandingBhardwaj review the results from 4/3.  She asked me to page the resident for f/u. Dr Delane GingerGill paged and is aware of results.

## 2014-03-26 NOTE — Telephone Encounter (Signed)
Thank you Dr. Delane GingerGill for following up on this and calling the patient.

## 2014-03-27 ENCOUNTER — Telehealth: Payer: Self-pay | Admitting: *Deleted

## 2014-03-27 NOTE — Telephone Encounter (Signed)
Thanks

## 2014-03-27 NOTE — Telephone Encounter (Signed)
Returned pt's call - pt had called stating she could not find out which pharmacy Macrobid was sent to. I called rx to [facility] on [address] per pt's request.

## 2014-04-09 NOTE — Progress Notes (Signed)
Case discussed with Dr. Gill soon after the resident saw the patient.  We reviewed the resident's history and exam and pertinent patient test results.  I agree with the assessment, diagnosis, and plan of care documented in the resident's note. 

## 2014-06-24 NOTE — Telephone Encounter (Signed)
Opened in error

## 2014-07-07 NOTE — Telephone Encounter (Signed)
A user error has taken place: encounter opened in error, closed for administrative reasons.

## 2014-07-27 ENCOUNTER — Encounter (HOSPITAL_COMMUNITY): Payer: Self-pay | Admitting: Emergency Medicine

## 2014-07-27 ENCOUNTER — Emergency Department (HOSPITAL_COMMUNITY): Payer: Worker's Compensation

## 2014-07-27 ENCOUNTER — Emergency Department (HOSPITAL_COMMUNITY)
Admission: EM | Admit: 2014-07-27 | Discharge: 2014-07-27 | Disposition: A | Discharge status: Home / Self Care | Payer: Worker's Compensation | Attending: Emergency Medicine | Admitting: Emergency Medicine

## 2014-07-27 DIAGNOSIS — S46909A Unspecified injury of unspecified muscle, fascia and tendon at shoulder and upper arm level, unspecified arm, initial encounter: Secondary | ICD-10-CM | POA: Insufficient documentation

## 2014-07-27 DIAGNOSIS — S4980XA Other specified injuries of shoulder and upper arm, unspecified arm, initial encounter: Secondary | ICD-10-CM | POA: Diagnosis not present

## 2014-07-27 DIAGNOSIS — I10 Essential (primary) hypertension: Secondary | ICD-10-CM | POA: Diagnosis not present

## 2014-07-27 DIAGNOSIS — X500XXA Overexertion from strenuous movement or load, initial encounter: Secondary | ICD-10-CM | POA: Diagnosis not present

## 2014-07-27 DIAGNOSIS — Y9289 Other specified places as the place of occurrence of the external cause: Secondary | ICD-10-CM | POA: Diagnosis not present

## 2014-07-27 DIAGNOSIS — F17200 Nicotine dependence, unspecified, uncomplicated: Secondary | ICD-10-CM | POA: Diagnosis not present

## 2014-07-27 DIAGNOSIS — R55 Syncope and collapse: Secondary | ICD-10-CM | POA: Diagnosis not present

## 2014-07-27 DIAGNOSIS — IMO0002 Reserved for concepts with insufficient information to code with codable children: Secondary | ICD-10-CM | POA: Insufficient documentation

## 2014-07-27 DIAGNOSIS — S239XXA Sprain of unspecified parts of thorax, initial encounter: Secondary | ICD-10-CM | POA: Diagnosis not present

## 2014-07-27 DIAGNOSIS — M62838 Other muscle spasm: Secondary | ICD-10-CM | POA: Diagnosis not present

## 2014-07-27 DIAGNOSIS — Y9389 Activity, other specified: Secondary | ICD-10-CM | POA: Diagnosis not present

## 2014-07-27 DIAGNOSIS — Z79899 Other long term (current) drug therapy: Secondary | ICD-10-CM | POA: Diagnosis not present

## 2014-07-27 DIAGNOSIS — S298XXA Other specified injuries of thorax, initial encounter: Secondary | ICD-10-CM | POA: Diagnosis not present

## 2014-07-27 LAB — CBC
HEMATOCRIT: 37.3 % (ref 36.0–46.0)
HEMOGLOBIN: 12.2 g/dL (ref 12.0–15.0)
MCH: 29.4 pg (ref 26.0–34.0)
MCHC: 32.7 g/dL (ref 30.0–36.0)
MCV: 89.9 fL (ref 78.0–100.0)
Platelets: 176 10*3/uL (ref 150–400)
RBC: 4.15 MIL/uL (ref 3.87–5.11)
RDW: 14.1 % (ref 11.5–15.5)
WBC: 6.5 10*3/uL (ref 4.0–10.5)

## 2014-07-27 LAB — BASIC METABOLIC PANEL
Anion gap: 13 (ref 5–15)
BUN: 11 mg/dL (ref 6–23)
CHLORIDE: 104 meq/L (ref 96–112)
CO2: 24 mEq/L (ref 19–32)
Calcium: 8.5 mg/dL (ref 8.4–10.5)
Creatinine, Ser: 0.5 mg/dL (ref 0.50–1.10)
GFR calc Af Amer: >90 mL/min (ref >90)
GFR calc non Af Amer: >90 mL/min (ref >90)
GLUCOSE: 91 mg/dL (ref 70–99)
POTASSIUM: 3.9 meq/L (ref 3.7–5.3)
SODIUM: 141 meq/L (ref 137–147)

## 2014-07-27 LAB — I-STAT TROPONIN, ED: Troponin i, poc: 0 ng/mL (ref 0.00–0.08)

## 2014-07-27 MED ORDER — HYDROCODONE-ACETAMINOPHEN 5-325 MG PO TABS: 1.0000 | ORAL_TABLET | Freq: Four times a day (QID) | ORAL | Status: DC | PRN

## 2014-07-27 MED ORDER — ONDANSETRON HCL 4 MG/2ML IJ SOLN
4.0000 mg | Freq: Once | INTRAMUSCULAR | Status: AC
  Administered 2014-07-27: 4 mg via INTRAVENOUS
  Filled 2014-07-27: qty 2

## 2014-07-27 MED ORDER — DIAZEPAM 5 MG/ML IJ SOLN
2.5000 mg | Freq: Once | INTRAMUSCULAR | Status: AC
  Administered 2014-07-27: 2.5 mg via INTRAVENOUS
  Filled 2014-07-27: qty 2

## 2014-07-27 MED ORDER — DIAZEPAM 2 MG PO TABS: 2.0000 mg | ORAL_TABLET | Freq: Four times a day (QID) | ORAL | Status: DC | PRN

## 2014-07-27 MED ORDER — FENTANYL CITRATE 0.05 MG/ML IJ SOLN
50.0000 ug | Freq: Once | INTRAMUSCULAR | Status: AC
  Administered 2014-07-27: 50 ug via INTRAVENOUS
  Filled 2014-07-27: qty 2

## 2014-07-27 NOTE — Discharge Instructions (Signed)
You have been given prescriptions for pain control and muscle relaxation  Please use these as needed  But be careful as they may make you sleepy  Follow up with  Your PCP as needed

## 2014-07-27 NOTE — ED Notes (Signed)
Patient asked about acquiring paperwork needed for workers compensation. This RN inquired with charge RN about having drug test completed and paperwork provided. At this time phlebotomy personnel is not available to collect samples and analyze. Informed patient that she may have to wait until after 0700 to have necessary testing completed. Patient stated that she will contact her employer to obtain needed testing and requested to leave at this time.

## 2014-07-27 NOTE — ED Provider Notes (Signed)
Medical screening examination/treatment/procedure(s) were performed by non-physician practitioner and as supervising physician I was immediately available for consultation/collaboration.   EKG Interpretation   Date/Time:  Monday July 27 2014 02:10:08 EDT Ventricular Rate:  59 PR Interval:  155 QRS Duration: 72 QT Interval:  426 QTC Calculation: 422 R Axis:   40 Text Interpretation:  Sinus rhythm Probable LVH with secondary repol abnrm  Anterior ST elevation, probably due to LVH Confirmed by Ranae PalmsYELVERTON  MD,  Geneal Huebert ([id number]) on 07/27/2014 3:02:14 AM        Loren Raceravid Lory Galan, MD 07/27/14 [phone]

## 2014-07-27 NOTE — ED Notes (Signed)
Pt holding for urine sample and blood test, required for Workman's Com.

## 2014-07-27 NOTE — ED Notes (Signed)
Patient here from work after losing consciousness. Currently works in nursing home. Explains that she was working [employer], lifted a patient, felt a pop in her back, right shoulder/neck. This left her with some pain with movement of that arm. Later after that incident patient was noted by co-workers sitting in chair, where she had been doing paperwork, unconscious.

## 2014-07-27 NOTE — ED Notes (Signed)
Pt alert and oriented at time of discharge.  Pt provided a wheelchair to the waiting room where she was taken home by family.  Pt verbalized understanding of discharge instructions and the need for follow up care.

## 2014-07-27 NOTE — ED Provider Notes (Signed)
CSN: [id number]     Arrival date & time 07/27/14  0158 History   None    Chief Complaint  Patient presents with  . Back Pain  . 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  She felt a sudden sharp pop in her upper thoracic area radiating to her right shoulder.  The pain was excruciating.  She finished her job and was sitting waiting charts when she turned her neck to the right and suddenly had increasing pain, causing a syncopal episode EMS was called and found the patient.  Sitting, in the chair , unresponsive to see him again, is alert and appropriate, with no postictal phase, but complaining of severe right shoulder, right anterior and posterior chest pain, and muscle spasm. She states she is waiting for her prescription to be filled at the pharmacy for her blood pressure medicine, which he has not taken in the past 2, days, but normally takes on a regular basis. At this time.  She is denying any headache, visual changes, nausea, vomiting, URI symptoms, cough.  Patient is a 58 y.o. female presenting with back pain and near-syncope. The history is provided by the patient.  Back Pain Location:  Thoracic spine Quality:  Aching Radiates to:  R shoulder Pain severity:  Severe Pain is:  Same all the time Onset quality:  Sudden Timing:  Constant Progression:  Unchanged Chronicity:  New Context: lifting heavy objects   Relieved by:  None tried Worsened by:  Movement Ineffective treatments:  None tried Associated symptoms: chest pain and weakness   Associated symptoms: no fever and no headaches   Associated symptoms comment:  Syncopal episode Weakness:    Severity:  Moderate   Onset quality:  Sudden   Chronicity:  New   Timing:  Constant Risk factors: no hx of osteoporosis,  no lack of exercise, not obese, no recent surgery and no steroid use   Near Syncope Associated symptoms include chest pain and weakness. Pertinent negatives include no fever, headaches, nausea, rash or vomiting.    Past Medical History  Diagnosis Date  . Hypertension    Past Surgical History  Procedure Laterality Date  . Cholecystectomy    . Abdominal hysterectomy     History reviewed. No pertinent family history. History  Substance Use Topics  . Smoking status: Current Every Day Smoker -- 0.50 packs/day for 20 years  . Smokeless tobacco: Not on file  . Alcohol Use: Yes     Comment: socially   OB History   Grav Para Term Preterm Abortions TAB SAB Ect Mult Living                 Review of Systems  Constitutional: Negative for fever.  Respiratory: Negative for shortness of breath.   Cardiovascular: Positive for chest pain and near-syncope.  Gastrointestinal: Negative for nausea and vomiting.  Musculoskeletal: Positive for back pain.  Skin: Negative for rash and wound.  Neurological: Positive for syncope and weakness. Negative for dizziness and headaches.  All other systems reviewed and are negative.     Allergies  Demerol; Morphine and related; and Other  Home Medications   Prior to Admission medications   Medication Sig Start Date End Date Taking? Authorizing Provider  amLODipine (NORVASC) 5 MG tablet Take 1 tablet (5 mg total) by mouth daily. 03/17/14 03/17/15  Marrian Salvage, MD  cetirizine (ZYRTEC ALLERGY) 10 MG tablet Take 1 tablet (10 mg total) by mouth daily. 09/27/13   Servando Salina, NP  diazepam (VALIUM) 2 MG tablet Take 1 tablet (2 mg total) by mouth every 6 (six) hours as needed for anxiety. 07/27/14   Arman Filter, NP  fluticasone (FLONASE) 50 MCG/ACT nasal spray Place 2 sprays into the nose daily. 09/27/13   Servando Salina, NP  HYDROcodone-acetaminophen (NORCO/VICODIN) 5-325 MG per tablet Take 1 tablet by mouth every 6 (six) hours as needed.  07/27/14   Arman Filter, NP  nitrofurantoin, macrocrystal-monohydrate, (MACROBID) 100 MG capsule Take 1 capsule (100 mg total) by mouth 2 (two) times daily. X 7 days 03/26/14   Marrian Salvage, MD   BP 179/78  Pulse 51  Temp(Src) 98.9 F (37.2 C) (Oral)  Resp 20  SpO2 100% Physical Exam  Nursing note and vitals reviewed. Constitutional: She is oriented to person, place, and time. She appears well-developed and well-nourished. She appears distressed.  HENT:  Head: Normocephalic.  Right Ear: External ear normal.  Left Ear: External ear normal.  Mouth/Throat: Oropharynx is clear and moist.  Eyes: Pupils are equal, round, and reactive to light.  Neck: Muscular tenderness present. No spinous process tenderness present.    Cardiovascular: Normal rate and regular rhythm.   Pulmonary/Chest: Effort normal and breath sounds normal.  Abdominal: Soft. She exhibits no distension. There is no tenderness.  Musculoskeletal: She exhibits tenderness. She exhibits no edema.       Arms: Neurological: She is alert and oriented to person, place, and time.  Skin: Skin is warm. No rash noted. No erythema.    ED Course  Procedures (including critical care time) Labs Review Labs Reviewed  CBC  BASIC METABOLIC PANEL  Rosezena Sensor, ED    Imaging Review Dg Thoracic Spine W/swimmers  07/27/2014   CLINICAL DATA:  Felt something pop in back, while lifting someone. Mid back pain.  EXAM: THORACIC SPINE - 2 VIEW + SWIMMERS  COMPARISON:  Chest radiograph from 03/06/2006  FINDINGS: There is no evidence of fracture or subluxation. Vertebral bodies demonstrate normal height and alignment. Intervertebral disc spaces are preserved. Scattered small anterior osteophytes are seen along the thoracic spine.  The visualized portions of both lungs are clear. The mediastinum is unremarkable in appearance. Clips are noted within the right upper quadrant, reflecting prior cholecystectomy.  IMPRESSION: No evidence of  fracture or subluxation along the thoracic spine.   Electronically Signed   By: Roanna Raider M.D.   On: 07/27/2014 03:30   Ct Head Wo Contrast  07/27/2014   CLINICAL DATA:  Loss of consciousness  EXAM: CT HEAD WITHOUT CONTRAST  TECHNIQUE: Contiguous axial images were obtained from the base of the skull through the vertex without intravenous contrast.  COMPARISON:  None.  FINDINGS: There is no acute intracranial hemorrhage or infarct. No mass lesion or midline shift. Gray-white matter differentiation is well maintained. Ventricles are normal in size without evidence of hydrocephalus. CSF containing spaces are within normal limits. No extra-axial fluid collection.  The calvarium is intact.  Orbital soft tissues are within normal limits.  Air-fluid level present within the left maxillary sinus, suggesting acute sinusitis. Mild circumferential mucosal thickening present within the left sphenoid sinus and ethmoidal air cells. No mastoid effusion.  Scalp soft tissues are unremarkable.  IMPRESSION: 1. No acute intracranial process. 2. Left maxillary, sphenoid, and ethmoidal sinus disease with air-fluid level within the left maxillary sinus,  suggesting active sinusitis. This may be either infectious or inflammatory nature appear   Electronically Signed   By: Rise MuBenjamin  McClintock M.D.   On: 07/27/2014 03:27     EKG Interpretation   Date/Time:  Monday July 27 2014 02:10:08 EDT Ventricular Rate:  59 PR Interval:  155 QRS Duration: 72 QT Interval:  426 QTC Calculation: 422 R Axis:   40 Text Interpretation:  Sinus rhythm Probable LVH with secondary repol abnrm  Anterior ST elevation, probably due to LVH Confirmed by Ranae PalmsYELVERTON  MD,  DAVID ([id number]) on 07/27/2014 3:02:14 AM      MDM   Final diagnoses:  Thoracic back sprain, initial encounter  Vasovagal syncope    Patient's head CT is negative.  EKG, is normal.  Lab work, is all within normal parameters.  X-ray of the thoracic spine reveals no  subluxation, or fractures.  She was given small dose of fentanyl and Valium IV to to her sensitivities to morphine and Demerol.  She had significant decrease in her pain.  He was also applied.  She will be discharged home with prescription for Vicodin which you know she can tolerate and 2 mg of Valium.  That she sees on a regular basis for the next 3-5 days.  She is not to work until next week to allow this muscle to rest.  Also been instructed in proper use of heat therapy     Arman FilterGail K Dayten Juba, NP 07/27/14 [phone]

## 2014-08-10 ENCOUNTER — Encounter: Payer: Self-pay | Admitting: Internal Medicine

## 2014-08-10 DIAGNOSIS — J349 Unspecified disorder of nose and nasal sinuses: Secondary | ICD-10-CM | POA: Insufficient documentation

## 2014-08-27 ENCOUNTER — Other Ambulatory Visit: Payer: Self-pay | Admitting: Internal Medicine

## 2014-09-07 ENCOUNTER — Ambulatory Visit (INDEPENDENT_AMBULATORY_CARE_PROVIDER_SITE_OTHER): Payer: 59 | Admitting: Internal Medicine

## 2014-09-07 ENCOUNTER — Encounter: Payer: Self-pay | Admitting: Internal Medicine

## 2014-09-07 VITALS — BP 137/70 | HR 88 | Temp 98.3°F | Ht 59.0 in | Wt 149.2 lb

## 2014-09-07 DIAGNOSIS — G5690 Unspecified mononeuropathy of unspecified upper limb: Secondary | ICD-10-CM

## 2014-09-07 DIAGNOSIS — Z1231 Encounter for screening mammogram for malignant neoplasm of breast: Secondary | ICD-10-CM

## 2014-09-07 DIAGNOSIS — F17200 Nicotine dependence, unspecified, uncomplicated: Secondary | ICD-10-CM

## 2014-09-07 DIAGNOSIS — Z1212 Encounter for screening for malignant neoplasm of rectum: Secondary | ICD-10-CM

## 2014-09-07 DIAGNOSIS — I10 Essential (primary) hypertension: Secondary | ICD-10-CM

## 2014-09-07 DIAGNOSIS — Z1211 Encounter for screening for malignant neoplasm of colon: Secondary | ICD-10-CM

## 2014-09-07 DIAGNOSIS — Z720 Tobacco use: Secondary | ICD-10-CM

## 2014-09-07 NOTE — Assessment & Plan Note (Addendum)
-  pt wants to quit smoking and is interested in nicotine patches, she will purchase these OTC -provided info on smoking cessation

## 2014-09-07 NOTE — Assessment & Plan Note (Signed)
BP Readings from Last 3 Encounters:  09/07/14 137/70  07/27/14 179/78  03/17/14 147/82    Lab Results  Component Value Date   NA 141 07/27/2014   K 3.9 07/27/2014   CREATININE 0.50 07/27/2014    Assessment: Blood pressure control: controlled Progress toward BP goal:  at goal  Plan: Medications:  continue current medications of amlodipine  daily

## 2014-09-07 NOTE — Assessment & Plan Note (Signed)
Pt continues to c/o bilateral UE pain and paresthesias which is worse at night when she lies down.  The distribution is bilateral fingers with radiation up the arm, not exactly in the median distribution which is also not necessary for a diagnosis of CTS.   Denies any FH of rheumatological diseases.  Labs have been normal, no thyroid or B12 abnormalities.  No systemic s/s--no fever, chills, fatigue.  Neurovascularly intact.  No neck pain so doubt cervical radiculopathy.  She uses aleve and ibuprofen which provides some relief.  Reports using braces inconsistently because she states "it cuts my circulation off."  I encouraged her to use the braces especially at night.  Unfortunately, an appt was made in May 2015 for her to have NCS/EMG but she did not have the copay to go.  I stressed to her the importance of having this test completed to find out exactly what is causing her pain.  I doubt that she would be interested in surgery given her limited resources and financial difficulty.   -continue ibuprofen/aleve as needed -would continue with braces especially at night -advised to try tylenol if needed  -stressed importance of NCS/EMG testing -considered referral to [facility] but again, this would involve another copay

## 2014-09-07 NOTE — Assessment & Plan Note (Addendum)
Pt needs health maintenance items but is unable to afford copays at this time so we will hold off on colonoscopy today.  She states she had a hysterectomy years ago for fibroids and was told she does not need a pap smear.   -declined influenza vaccine -defer colonoscopy given financial situation -mammogram ordered today  -pt reports she had a hysterectomy (for uterine fibroids) and was told that she no longer needed pap smears

## 2014-09-07 NOTE — Progress Notes (Signed)
Patient ID: Renee Murray, female   DOB: 20-Sep-1956, 58 y.o.MRN: [id number]    Subjective:   Patient ID: Renee Murray female    DOB: 05-Oct-1956 58 y.o.    MRN: [id number] Health Maintenance Due: Health Maintenance Due  Topic Date Due  . Tetanus/tdap  03/24/1973  . Mammogram  03/24/2004  . Colonoscopy  03/24/2004  . Pap Smear  08/25/2011  . Zostavax  03/24/2014    _________________________________________________  HPI: [name] is a 58 y.o. female here for a routine visit.  Pt has a PMH outlined below.  Please see problem-based charting assessment and plan note for further details of medical issues addressed at today's visit.  PMH: Past Medical History  Diagnosis Date  . Hypertension     Medications: Current Outpatient Prescriptions on File Prior to Visit  Medication Sig Dispense Refill  . amLODipine (NORVASC) 5 MG tablet TAKE ONE TABLET BY MOUTH ONCE DAILY  30 tablet  1  . fluticasone (FLONASE) 50 MCG/ACT nasal spray Place 2 sprays into the nose daily.  16 g  2   No current facility-administered medications on file prior to visit.    Allergies: Allergies  Allergen Reactions  . Demerol   . Morphine And Related Other (See Comments)    "Almost Died"  . Other     Allergic to something she had for surgery    FH: No family history on file.  SH: History   Social History  . Marital Status: Divorced    Spouse Name: N/A    Number of Children: N/A  . Years of Education: N/A   Social History Main Topics  . Smoking status: Current Every Day Smoker -- 0.50 packs/day for 20 years  . Smokeless tobacco: Not on file  . Alcohol Use: Yes     Comment: socially  . Drug Use: No  . Sexual Activity: Not on file   Other Topics Concern  . Not on file   Social History Narrative  . No narrative on file    Review of Systems: Constitutional: Negative for fever, chills and weight loss.  Eyes: Negative for blurred vision.  Respiratory: Negative for cough and  shortness of breath.  Cardiovascular: Negative for chest pain, palpitations and leg swelling.  Gastrointestinal: Negative for nausea, vomiting, abdominal pain, diarrhea, constipation and blood in stool.  Genitourinary: Negative for dysuria, urgency and frequency.  Musculoskeletal: Positive for myalgias and back pain.  Neurological: Negative for dizziness, weakness and headaches.     Objective:   Vital Signs: Filed Vitals:   09/07/14 1501  BP: 137/70  Pulse: 88  Temp: 98.3 F (36.8 C)  TempSrc: Oral  Height:  (1.499 m)  Weight: 149 lb 3.2 oz (67.677 kg)  SpO2: 100%     BP Readings from Last 3 Encounters:  09/07/14 137/70  07/27/14 179/78  03/17/14 147/82    Physical Exam: Constitutional: Vital signs reviewed.  Patient is well-developed and well-nourished in NAD and cooperative with exam.  Head: Normocephalic and atraumatic. Eyes: PERRL, EOMI, conjunctivae nl, no scleral icterus.  Neck: Supple. Cardiovascular: RRR, no MRG. Pulmonary/Chest: normal effort, non-tender to palpation, CTAB, no wheezes, rales, or rhonchi. Abdominal: Soft. NT/ND +BS. Musculoskeletal: Full ROM of UE, no joint deformity, erythema, warmth, or edema.   Neurological: A&O x3, cranial nerves II-XII are grossly intact, moving all extremities. Extremities: 2+DP b/l; 2+Radial pulses b/l.  No LE pitting edema. Skin: Warm, dry and intact. No rash.   Assessment & Plan:   Assessment  and plan was discussed and formulated with my attending.

## 2014-09-07 NOTE — Patient Instructions (Signed)
Thank you for your visit today.   Please return to the internal medicine clinic in 4-6 month(s) or sooner if needed.     Your current medical regimen is effective;  continue present plan and take all medications as prescribed.    You may take ibuprofen or aleve for your pain. You may also take tylenol daily not to exceed /day.  Please continue to wear the braces at night.   I have made the following referrals for you: neurology for nerve conduction studies.   You need the following test(s) for regular health maintenance:  1. Mammogram 2. Colonoscopy   Please be sure to bring all of your medications with you to every visit; this includes herbal supplements, vitamins, eye drops, and any over-the-counter medications.   Should you have any questions regarding your medications and/or any new or worsening symptoms, please be sure to call the clinic at [phone].   If you believe that you are suffering from a life threatening condition or one that may result in the loss of limb or function, then you should call 911 or proceed to the nearest Emergency Department.

## 2014-09-08 NOTE — Progress Notes (Signed)
Case discussed with Dr. Gill soon after the resident saw the patient.  We reviewed the resident's history and exam and pertinent patient test results.  I agree with the assessment, diagnosis, and plan of care documented in the resident's note. 

## 2014-09-15 ENCOUNTER — Other Ambulatory Visit: Payer: Self-pay | Admitting: Family Medicine

## 2014-10-05 ENCOUNTER — Telehealth: Payer: Self-pay | Admitting: Neurology

## 2014-10-05 ENCOUNTER — Encounter: Payer: 59 | Admitting: Neurology

## 2014-10-05 ENCOUNTER — Encounter: Payer: Self-pay | Admitting: Internal Medicine

## 2014-10-05 NOTE — Telephone Encounter (Signed)
The patient did not show for an EMG today

## 2014-12-18 HISTORY — PX: CEREBRAL ANEURYSM REPAIR: SHX164

## 2014-12-29 ENCOUNTER — Encounter (HOSPITAL_COMMUNITY): Payer: Self-pay | Admitting: *Deleted

## 2014-12-29 ENCOUNTER — Emergency Department (HOSPITAL_COMMUNITY)
Admission: EM | Admit: 2014-12-29 | Discharge: 2014-12-29 | Disposition: A | Discharge status: Home / Self Care | Payer: Managed Care, Other (non HMO) | Source: Home / Self Care | Attending: Family Medicine | Admitting: Family Medicine

## 2014-12-29 DIAGNOSIS — M79672 Pain in left foot: Secondary | ICD-10-CM

## 2014-12-29 NOTE — ED Notes (Signed)
Pt  Reports   l   Foot  Pain      With  Drawing           And  Cramping         In  The  Affected   Foot         Denys   Any  Injury               Pt  Reports  The   Symptoms  Started      yest

## 2014-12-29 NOTE — Discharge Instructions (Signed)
Use tonic water, see podiatrist for further check and arch supports.

## 2014-12-29 NOTE — ED Provider Notes (Signed)
CSN: [id number]     Arrival date & time 12/29/14  1240 History   First MD Initiated Contact with Patient 12/29/14 1249     Chief Complaint  Patient presents with  . Foot Pain   (Consider location/radiation/quality/duration/timing/severity/associated sxs/prior Treatment) Patient is a 59 y.o. female presenting with lower extremity pain. The history is provided by the patient.  Foot Pain This is a recurrent problem. The current episode started yesterday (onset usually at hs, cramping.). The problem has been resolved.    Past Medical History  Diagnosis Date  . Hypertension    Past Surgical History  Procedure Laterality Date  . Cholecystectomy    . Abdominal hysterectomy     History reviewed. No pertinent family history. History  Substance Use Topics  . Smoking status: Current Every Day Smoker -- 0.50 packs/day for 20 years  . Smokeless tobacco: Not on file  . Alcohol Use: Yes     Comment: socially   OB History    No data available     Review of Systems  Musculoskeletal: Positive for myalgias. Negative for joint swelling and gait problem.  Skin: Negative.     Allergies  Demerol; Morphine and related; and Other  Home Medications   Prior to Admission medications   Medication Sig Start Date End Date Taking? Authorizing Provider  amLODipine (NORVASC) 5 MG tablet TAKE ONE TABLET BY MOUTH ONCE DAILY 08/27/14   Marrian SalvageJacquelyn S Gill, MD  fluticasone (FLONASE) 50 MCG/ACT nasal spray Place 2 sprays into the nose daily. 09/27/13   Servando Salinaatherine H Rossi, NP   BP 159/78 mmHg  Pulse 69  Temp(Src) 97.9 F (36.6 C) (Oral)  Resp 12  SpO2 96% Physical Exam  Constitutional: She is oriented to person, place, and time. She appears well-developed and well-nourished. No distress.  Cardiovascular: Intact distal pulses.   Musculoskeletal: Normal range of motion. She exhibits no tenderness.  Neurological: She is alert and oriented to person, place, and time.  Skin: Skin is warm and dry.  Nursing  note and vitals reviewed.   ED Course  Procedures (including critical care time) Labs Review Labs Reviewed - No data to display  Imaging Review No results found.   MDM   1. Foot arch pain, left        Linna HoffJames D Mitsue Peery, MD 12/29/14 1306

## 2015-02-04 ENCOUNTER — Encounter (HOSPITAL_COMMUNITY): Payer: Self-pay

## 2015-02-04 ENCOUNTER — Encounter (HOSPITAL_COMMUNITY)
Admission: EM | Disposition: A | Discharge status: Home Health | Payer: Self-pay | Source: Home / Self Care | Attending: Neurosurgery

## 2015-02-04 ENCOUNTER — Inpatient Hospital Stay (HOSPITAL_COMMUNITY): Payer: Managed Care, Other (non HMO) | Admitting: Anesthesiology

## 2015-02-04 ENCOUNTER — Inpatient Hospital Stay (HOSPITAL_COMMUNITY)
Admission: EM | Admit: 2015-02-04 | Discharge: 2015-02-17 | DRG: 020 | Disposition: A | Discharge status: Home Health | Payer: Managed Care, Other (non HMO) | Attending: Neurosurgery | Admitting: Neurosurgery

## 2015-02-04 ENCOUNTER — Inpatient Hospital Stay: Admit: 2015-02-04 | Payer: Managed Care, Other (non HMO) | Admitting: Neurosurgery

## 2015-02-04 ENCOUNTER — Emergency Department (HOSPITAL_COMMUNITY): Payer: Managed Care, Other (non HMO)

## 2015-02-04 ENCOUNTER — Inpatient Hospital Stay (HOSPITAL_COMMUNITY): Payer: Managed Care, Other (non HMO)

## 2015-02-04 DIAGNOSIS — Z90710 Acquired absence of both cervix and uterus: Secondary | ICD-10-CM | POA: Diagnosis not present

## 2015-02-04 DIAGNOSIS — R29810 Facial weakness: Secondary | ICD-10-CM | POA: Diagnosis not present

## 2015-02-04 DIAGNOSIS — Z885 Allergy status to narcotic agent status: Secondary | ICD-10-CM

## 2015-02-04 DIAGNOSIS — I10 Essential (primary) hypertension: Secondary | ICD-10-CM | POA: Diagnosis present

## 2015-02-04 DIAGNOSIS — G936 Cerebral edema: Secondary | ICD-10-CM | POA: Diagnosis not present

## 2015-02-04 DIAGNOSIS — I6011 Nontraumatic subarachnoid hemorrhage from right middle cerebral artery: Principal | ICD-10-CM | POA: Diagnosis present

## 2015-02-04 DIAGNOSIS — G8194 Hemiplegia, unspecified affecting left nondominant side: Secondary | ICD-10-CM | POA: Diagnosis not present

## 2015-02-04 DIAGNOSIS — F17210 Nicotine dependence, cigarettes, uncomplicated: Secondary | ICD-10-CM | POA: Diagnosis present

## 2015-02-04 DIAGNOSIS — R29898 Other symptoms and signs involving the musculoskeletal system: Secondary | ICD-10-CM

## 2015-02-04 DIAGNOSIS — I609 Nontraumatic subarachnoid hemorrhage, unspecified: Secondary | ICD-10-CM

## 2015-02-04 DIAGNOSIS — Z79899 Other long term (current) drug therapy: Secondary | ICD-10-CM

## 2015-02-04 DIAGNOSIS — M79606 Pain in leg, unspecified: Secondary | ICD-10-CM | POA: Diagnosis not present

## 2015-02-04 HISTORY — PX: IR GENERIC HISTORICAL: IMG1180011

## 2015-02-04 HISTORY — PX: CRANIOTOMY: SHX93

## 2015-02-04 LAB — BASIC METABOLIC PANEL
Anion gap: 4 — ABNORMAL LOW (ref 5–15)
BUN: 12 mg/dL (ref 6–23)
CALCIUM: 8.6 mg/dL (ref 8.4–10.5)
CO2: 27 mmol/L (ref 19–32)
CREATININE: 0.56 mg/dL (ref 0.50–1.10)
Chloride: 107 mmol/L (ref 96–112)
GFR calc Af Amer: >90 mL/min (ref >90)
GLUCOSE: 105 mg/dL — AB (ref 70–99)
Potassium: 3.9 mmol/L (ref 3.5–5.1)
Sodium: 138 mmol/L (ref 135–145)

## 2015-02-04 LAB — CBC WITH DIFFERENTIAL/PLATELET
BASOS ABS: 0 10*3/uL (ref 0.0–0.1)
BASOS PCT: 0 % (ref 0–1)
EOS PCT: 3 % (ref 0–5)
Eosinophils Absolute: 0.2 10*3/uL (ref 0.0–0.7)
HEMATOCRIT: 39.1 % (ref 36.0–46.0)
Hemoglobin: 12.9 g/dL (ref 12.0–15.0)
Lymphocytes Relative: 33 % (ref 12–46)
Lymphs Abs: 2.2 10*3/uL (ref 0.7–4.0)
MCH: 29.9 pg (ref 26.0–34.0)
MCHC: 33 g/dL (ref 30.0–36.0)
MCV: 90.7 fL (ref 78.0–100.0)
MONO ABS: 0.4 10*3/uL (ref 0.1–1.0)
Monocytes Relative: 6 % (ref 3–12)
Neutro Abs: 3.8 10*3/uL (ref 1.7–7.7)
Neutrophils Relative %: 58 % (ref 43–77)
Platelets: 169 10*3/uL (ref 150–400)
RBC: 4.31 MIL/uL (ref 3.87–5.11)
RDW: 14 % (ref 11.5–15.5)
WBC: 6.7 10*3/uL (ref 4.0–10.5)

## 2015-02-04 LAB — MRSA PCR SCREENING: MRSA by PCR: NEGATIVE

## 2015-02-04 LAB — PROTIME-INR
INR: 1.06 (ref 0.00–1.49)
PROTHROMBIN TIME: 13.9 s (ref 11.6–15.2)

## 2015-02-04 LAB — APTT: APTT: 32 s (ref 24–37)

## 2015-02-04 LAB — ABO/RH: ABO/RH(D): O NEG

## 2015-02-04 SURGERY — CRANIOTOMY INTRACRANIAL ANEURYSM FOR CAROTID
Anesthesia: General | Site: Head | Laterality: Right

## 2015-02-04 MED ORDER — BACITRACIN ZINC 500 UNIT/GM EX OINT
TOPICAL_OINTMENT | CUTANEOUS | Status: DC | PRN
  Administered 2015-02-04 (×2): 1 via TOPICAL

## 2015-02-04 MED ORDER — SUCCINYLCHOLINE CHLORIDE 20 MG/ML IJ SOLN
INTRAMUSCULAR | Status: DC | PRN
  Administered 2015-02-04: 100 mg via INTRAVENOUS

## 2015-02-04 MED ORDER — THROMBIN 20000 UNITS EX SOLR
CUTANEOUS | Status: DC | PRN
  Administered 2015-02-04: 19:00:00 via TOPICAL

## 2015-02-04 MED ORDER — NICARDIPINE HCL IN NACL 20-0.86 MG/200ML-% IV SOLN
3.0000 mg/h | INTRAVENOUS | Status: DC
  Administered 2015-02-04: 2 mg/h via INTRAVENOUS
  Administered 2015-02-05: 5 mg/h via INTRAVENOUS
  Filled 2015-02-04 (×2): qty 200

## 2015-02-04 MED ORDER — MICROFIBRILLAR COLL HEMOSTAT EX PADS
MEDICATED_PAD | CUTANEOUS | Status: DC | PRN
  Administered 2015-02-04: 1 via TOPICAL

## 2015-02-04 MED ORDER — STROKE: EARLY STAGES OF RECOVERY BOOK
Freq: Once | Status: DC
  Filled 2015-02-04: qty 1

## 2015-02-04 MED ORDER — AMLODIPINE BESYLATE 5 MG PO TABS
5.0000 mg | ORAL_TABLET | Freq: Every day | ORAL | Status: DC
  Administered 2015-02-05 – 2015-02-17 (×9): 5 mg via ORAL
  Filled 2015-02-04 (×14): qty 1

## 2015-02-04 MED ORDER — ACETAMINOPHEN 325 MG PO TABS
650.0000 mg | ORAL_TABLET | ORAL | Status: DC | PRN
  Administered 2015-02-05 – 2015-02-12 (×5): 650 mg via ORAL
  Filled 2015-02-04 (×5): qty 2

## 2015-02-04 MED ORDER — FENTANYL CITRATE 0.05 MG/ML IJ SOLN
INTRAMUSCULAR | Status: AC
  Filled 2015-02-04: qty 2

## 2015-02-04 MED ORDER — FLUTICASONE PROPIONATE 50 MCG/ACT NA SUSP
2.0000 | Freq: Every day | NASAL | Status: DC
  Administered 2015-02-05 – 2015-02-13 (×4): 2 via NASAL
  Filled 2015-02-04: qty 16

## 2015-02-04 MED ORDER — MANNITOL 25 % IV SOLN
INTRAVENOUS | Status: DC | PRN
  Administered 2015-02-04: 50 g via INTRAVENOUS

## 2015-02-04 MED ORDER — NIMODIPINE 60 MG/20ML PO SOLN
60.0000 mg | ORAL | Status: DC
  Administered 2015-02-05 – 2015-02-12 (×34): 60 mg
  Filled 2015-02-04 (×72): qty 20

## 2015-02-04 MED ORDER — INDOCYANINE GREEN 25 MG IV SOLR
25.0000 mg | INTRAVENOUS | Status: AC
  Administered 2015-02-04: 12.5 mg via INTRAVENOUS
  Filled 2015-02-04: qty 25

## 2015-02-04 MED ORDER — PANTOPRAZOLE SODIUM 40 MG IV SOLR
40.0000 mg | Freq: Every day | INTRAVENOUS | Status: DC
  Administered 2015-02-04: 40 mg via INTRAVENOUS
  Filled 2015-02-04 (×2): qty 40

## 2015-02-04 MED ORDER — LEVETIRACETAM IN NACL 1000 MG/100ML IV SOLN
1000.0000 mg | Freq: Once | INTRAVENOUS | Status: DC
  Filled 2015-02-04 (×3): qty 100

## 2015-02-04 MED ORDER — VECURONIUM BROMIDE 10 MG IV SOLR
INTRAVENOUS | Status: DC | PRN
  Administered 2015-02-04: 1 mg via INTRAVENOUS
  Administered 2015-02-04 (×2): 2 mg via INTRAVENOUS
  Administered 2015-02-04: 3 mg via INTRAVENOUS
  Administered 2015-02-04: 2 mg via INTRAVENOUS

## 2015-02-04 MED ORDER — LEVETIRACETAM 100 MG/ML PO SOLN
ORAL | Status: DC | PRN
  Administered 2015-02-04: 1000 mg via ORAL

## 2015-02-04 MED ORDER — CEFAZOLIN SODIUM-DEXTROSE 2-3 GM-% IV SOLR
INTRAVENOUS | Status: DC | PRN
  Administered 2015-02-04: 2 g via INTRAVENOUS

## 2015-02-04 MED ORDER — PHENYLEPHRINE HCL 10 MG/ML IJ SOLN
10.0000 mg | INTRAVENOUS | Status: DC | PRN
  Administered 2015-02-04: 5 ug/min via INTRAVENOUS

## 2015-02-04 MED ORDER — 0.9 % SODIUM CHLORIDE (POUR BTL) OPTIME
TOPICAL | Status: DC | PRN
  Administered 2015-02-04 (×2): 1000 mL

## 2015-02-04 MED ORDER — BUPIVACAINE HCL 0.5 % IJ SOLN
INTRAMUSCULAR | Status: DC | PRN
  Administered 2015-02-04: 7.5 mL

## 2015-02-04 MED ORDER — IOHEXOL 350 MG/ML SOLN
100.0000 mL | Freq: Once | INTRAVENOUS | Status: AC | PRN
  Administered 2015-02-04: 80 mL via INTRAVENOUS

## 2015-02-04 MED ORDER — ROCURONIUM BROMIDE 100 MG/10ML IV SOLN
INTRAVENOUS | Status: DC | PRN
  Administered 2015-02-04: 30 mg via INTRAVENOUS

## 2015-02-04 MED ORDER — ACETAMINOPHEN 500 MG PO TABS
1000.0000 mg | ORAL_TABLET | Freq: Once | ORAL | Status: AC
  Administered 2015-02-04: 1000 mg via ORAL
  Filled 2015-02-04: qty 2

## 2015-02-04 MED ORDER — INDIGOTINDISULFONATE SODIUM 8 MG/ML IJ SOLN
5.0000 mL | INTRAMUSCULAR | Status: DC
  Filled 2015-02-04: qty 5

## 2015-02-04 MED ORDER — NICARDIPINE HCL IN NACL 20-0.86 MG/200ML-% IV SOLN
3.0000 mg/h | Freq: Once | INTRAVENOUS | Status: AC
  Administered 2015-02-04: 5 mg/h via INTRAVENOUS
  Filled 2015-02-04: qty 200

## 2015-02-04 MED ORDER — LIDOCAINE HCL (PF) 1 % IJ SOLN
INTRAMUSCULAR | Status: DC | PRN
  Administered 2015-02-04: 7.5 mL

## 2015-02-04 MED ORDER — SODIUM CHLORIDE 0.9 % IV SOLN
INTRAVENOUS | Status: DC | PRN
  Administered 2015-02-04 (×3): via INTRAVENOUS

## 2015-02-04 MED ORDER — LABETALOL HCL 5 MG/ML IV SOLN: 10.0000 mg | INTRAVENOUS | Status: DC | PRN

## 2015-02-04 MED ORDER — SENNOSIDES-DOCUSATE SODIUM 8.6-50 MG PO TABS
1.0000 | ORAL_TABLET | Freq: Two times a day (BID) | ORAL | Status: DC
  Administered 2015-02-05 – 2015-02-17 (×16): 1 via ORAL
  Filled 2015-02-04 (×28): qty 1

## 2015-02-04 MED ORDER — ACETAMINOPHEN 650 MG RE SUPP: 650.0000 mg | RECTAL | Status: DC | PRN

## 2015-02-04 MED ORDER — IOHEXOL 300 MG/ML  SOLN
150.0000 mL | Freq: Once | INTRAMUSCULAR | Status: AC | PRN
  Administered 2015-02-04: 1 mL via INTRAVENOUS

## 2015-02-04 MED ORDER — NICARDIPINE HCL IN NACL 20-0.86 MG/200ML-% IV SOLN
INTRAVENOUS | Status: DC | PRN
  Administered 2015-02-04: 5 mg/h
  Administered 2015-02-04: 5 mg/h via INTRAVENOUS

## 2015-02-04 MED ORDER — MIDAZOLAM HCL 2 MG/2ML IJ SOLN
INTRAMUSCULAR | Status: AC
  Filled 2015-02-04: qty 2

## 2015-02-04 MED ORDER — DEXAMETHASONE SODIUM PHOSPHATE 10 MG/ML IJ SOLN
INTRAMUSCULAR | Status: DC | PRN
  Administered 2015-02-04: 10 mg via INTRAVENOUS

## 2015-02-04 MED ORDER — GLYCOPYRROLATE 0.2 MG/ML IJ SOLN
INTRAMUSCULAR | Status: DC | PRN
  Administered 2015-02-04: 0.6 mg via INTRAVENOUS

## 2015-02-04 MED ORDER — THROMBIN 5000 UNITS EX SOLR
CUTANEOUS | Status: DC | PRN
  Administered 2015-02-04: 19:00:00 via TOPICAL

## 2015-02-04 MED ORDER — SODIUM CHLORIDE 0.9 % IV SOLN
INTRAVENOUS | Status: DC
  Administered 2015-02-05 – 2015-02-11 (×10): via INTRAVENOUS

## 2015-02-04 MED ORDER — FENTANYL CITRATE 0.05 MG/ML IJ SOLN
INTRAMUSCULAR | Status: DC | PRN
Start: 2015-02-04 — End: 2015-02-04
  Administered 2015-02-04 (×2): 25 ug via INTRAVENOUS
  Administered 2015-02-04: 50 ug via INTRAVENOUS
  Administered 2015-02-04: 100 ug via INTRAVENOUS
  Administered 2015-02-04: 50 ug via INTRAVENOUS
  Administered 2015-02-04: 150 ug via INTRAVENOUS
  Administered 2015-02-04: 50 ug via INTRAVENOUS

## 2015-02-04 MED ORDER — PROPOFOL 10 MG/ML IV BOLUS
INTRAVENOUS | Status: DC | PRN
  Administered 2015-02-04: 50 mg via INTRAVENOUS
  Administered 2015-02-04: 20 mg via INTRAVENOUS
  Administered 2015-02-04: 170 mg via INTRAVENOUS

## 2015-02-04 MED ORDER — NICARDIPINE HCL IN NACL 20-0.86 MG/200ML-% IV SOLN
3.0000 mg/h | Freq: Once | INTRAVENOUS | Status: AC
Start: 2015-02-04 — End: 2015-02-04
  Administered 2015-02-04: 5 mg/h via INTRAVENOUS
  Filled 2015-02-04: qty 200

## 2015-02-04 MED ORDER — NIMODIPINE 30 MG PO CAPS
60.0000 mg | ORAL_CAPSULE | ORAL | Status: DC
  Administered 2015-02-09 – 2015-02-17 (×42): 60 mg via ORAL
  Filled 2015-02-04 (×78): qty 2

## 2015-02-04 MED ORDER — HEPARIN SODIUM (PORCINE) 1000 UNIT/ML IJ SOLN
INTRAMUSCULAR | Status: AC
  Filled 2015-02-04: qty 1

## 2015-02-04 MED ORDER — SODIUM CHLORIDE 0.9 % IV SOLN
INTRAVENOUS | Status: DC | PRN
  Administered 2015-02-04 (×2): via INTRAVENOUS

## 2015-02-04 MED ORDER — LIDOCAINE HCL (CARDIAC) 20 MG/ML IV SOLN
INTRAVENOUS | Status: DC | PRN
  Administered 2015-02-04: 40 mg via INTRAVENOUS

## 2015-02-04 MED ORDER — NEOSTIGMINE METHYLSULFATE 10 MG/10ML IV SOLN
INTRAVENOUS | Status: DC | PRN
  Administered 2015-02-04: 4 mg via INTRAVENOUS

## 2015-02-04 MED ORDER — INDIGOTINDISULFONATE SODIUM 8 MG/ML IJ SOLN: INTRAMUSCULAR | Status: DC | PRN

## 2015-02-04 SURGICAL SUPPLY — 100 items
5mm straight mini aneurysm clip (Clip) ×1 IMPLANT
8mm curved aneurysm clip (Clip) ×1 IMPLANT
APL SKNCLS STERI-STRIP NONHPOA (GAUZE/BANDAGES/DRESSINGS)
BANDAGE GAUZE 4  KLING STR (GAUZE/BANDAGES/DRESSINGS) ×2 IMPLANT
BENZOIN TINCTURE PRP APPL 2/3 (GAUZE/BANDAGES/DRESSINGS) IMPLANT
BIT DRILL WIRE PASS 1.3MM (BIT) IMPLANT
BLADE SAW GIGLI 16 STRL (MISCELLANEOUS) IMPLANT
BLADE SURG 15 STRL LF DISP TIS (BLADE) ×1 IMPLANT
BLADE SURG 15 STRL SS (BLADE) ×2
BLADE ULTRA TIP 2M (BLADE) IMPLANT
BNDG GAUZE ELAST 4 BULKY (GAUZE/BANDAGES/DRESSINGS) ×2 IMPLANT
BRUSH SCRUB EZ 1% IODOPHOR (MISCELLANEOUS) ×1 IMPLANT
BRUSH SCRUB EZ PLAIN DRY (MISCELLANEOUS) ×2 IMPLANT
BUR ACORN 6.0 PRECISION (BURR) ×2 IMPLANT
BUR ADDG 1.1 (BURR) IMPLANT
BUR MATCHSTICK NEURO 3.0 LAGG (BURR) ×1 IMPLANT
BUR ROUND FLUTED 4 SOFT TCH (BURR) IMPLANT
BUR ROUTER D-58 CRANI (BURR) IMPLANT
CANISTER SUCT 3000ML PPV (MISCELLANEOUS) ×2 IMPLANT
CLIP ANEURY TI PERM MINI STR 5 (Clip) ×1 IMPLANT
CLIP ANEURY TI PERM STD CVD 8M (Clip) ×1 IMPLANT
CLIP TI MEDIUM 6 (CLIP) IMPLANT
CONT SPEC 4OZ CLIKSEAL STRL BL (MISCELLANEOUS) ×2 IMPLANT
CORDS BIPOLAR (ELECTRODE) ×1 IMPLANT
COVER MAYO STAND STRL (DRAPES) IMPLANT
DECANTER SPIKE VIAL GLASS SM (MISCELLANEOUS) ×2 IMPLANT
DRAIN SNY WOU 7FLT (WOUND CARE) IMPLANT
DRAPE MICROSCOPE LEICA (MISCELLANEOUS) ×2 IMPLANT
DRAPE NEUROLOGICAL W/INCISE (DRAPES) ×2 IMPLANT
DRAPE PROXIMA HALF (DRAPES) ×3 IMPLANT
DRAPE WARM FLUID 44X44 (DRAPE) ×2 IMPLANT
DRILL WIRE PASS 1.3MM (BIT)
DRSG ADAPTIC 3X8 NADH LF (GAUZE/BANDAGES/DRESSINGS) ×1 IMPLANT
DRSG TELFA 3X8 NADH (GAUZE/BANDAGES/DRESSINGS) IMPLANT
DURAPREP 6ML APPLICATOR 50/CS (WOUND CARE) ×2 IMPLANT
ELECT CAUTERY BLADE 6.4 (BLADE) ×2 IMPLANT
ELECT REM PT RETURN 9FT ADLT (ELECTROSURGICAL) ×2
ELECTRODE REM PT RTRN 9FT ADLT (ELECTROSURGICAL) ×1 IMPLANT
EVACUATOR SILICONE 100CC (DRAIN) IMPLANT
GAUZE SPONGE 4X4 12PLY STRL (GAUZE/BANDAGES/DRESSINGS) ×1 IMPLANT
GAUZE SPONGE 4X4 16PLY XRAY LF (GAUZE/BANDAGES/DRESSINGS) IMPLANT
GLOVE BIO SURGEON STRL SZ 6.5 (GLOVE) ×2 IMPLANT
GLOVE BIO SURGEON STRL SZ7 (GLOVE) ×1 IMPLANT
GLOVE BIO SURGEON STRL SZ8 (GLOVE) ×2 IMPLANT
GLOVE BIOGEL PI IND STRL 7.5 (GLOVE) ×1 IMPLANT
GLOVE BIOGEL PI INDICATOR 7.5 (GLOVE) ×2
GLOVE ECLIPSE 7.0 STRL STRAW (GLOVE) ×4 IMPLANT
GLOVE ECLIPSE 7.5 STRL STRAW (GLOVE) ×1 IMPLANT
GLOVE INDICATOR 8.5 STRL (GLOVE) ×1 IMPLANT
GOWN STRL REUS W/ TWL LRG LVL3 (GOWN DISPOSABLE) ×2 IMPLANT
GOWN STRL REUS W/ TWL XL LVL3 (GOWN DISPOSABLE) IMPLANT
GOWN STRL REUS W/TWL 2XL LVL3 (GOWN DISPOSABLE) ×1 IMPLANT
GOWN STRL REUS W/TWL LRG LVL3 (GOWN DISPOSABLE) ×6
GOWN STRL REUS W/TWL XL LVL3 (GOWN DISPOSABLE) ×2
GRAFT DURAGEN MATRIX 2WX2L ×2 IMPLANT
HEMOSTAT SURGICEL 2X14 (HEMOSTASIS) ×2 IMPLANT
HOOK DURA (MISCELLANEOUS) ×2 IMPLANT
KIT BASIN OR (CUSTOM PROCEDURE TRAY) ×2 IMPLANT
KIT DRAIN CSF ACCUDRAIN (MISCELLANEOUS) IMPLANT
KIT ROOM TURNOVER OR (KITS) ×2 IMPLANT
KNIFE ARACHNOID DISP AM-21-S (BLADE) ×1 IMPLANT
KNIFE ARACHNOID DISP AM-24-S (MISCELLANEOUS) ×1 IMPLANT
NDL HYPO 25X1 1.5 SAFETY (NEEDLE) ×1 IMPLANT
NEEDLE HYPO 25X1 1.5 SAFETY (NEEDLE) ×2 IMPLANT
NS IRRIG 1000ML POUR BTL (IV SOLUTION) ×2 IMPLANT
PACK CRANIOTOMY (CUSTOM PROCEDURE TRAY) ×2 IMPLANT
PAD ARMBOARD 7.5X6 YLW CONV (MISCELLANEOUS) ×4 IMPLANT
PAD DRESSING TELFA 3X8 NADH (GAUZE/BANDAGES/DRESSINGS) IMPLANT
PATTIES SURGICAL .25X.25 (GAUZE/BANDAGES/DRESSINGS) IMPLANT
PATTIES SURGICAL .5 X.5 (GAUZE/BANDAGES/DRESSINGS) IMPLANT
PATTIES SURGICAL .5 X3 (DISPOSABLE) IMPLANT
PATTIES SURGICAL 1/4 X 3 (GAUZE/BANDAGES/DRESSINGS) IMPLANT
PATTIES SURGICAL 1X1 (DISPOSABLE) IMPLANT
PIN MAYFIELD SKULL DISP (PIN) IMPLANT
PLATE 1.5  2HOLE LNG NEURO (Plate) ×3 IMPLANT
PLATE 1.5 2HOLE LNG NEURO (Plate) IMPLANT
RUBBERBAND STERILE (MISCELLANEOUS) ×4 IMPLANT
SCREW SELF DRILL HT 1.5/4MM (Screw) ×6 IMPLANT
SPONGE NEURO XRAY DETECT 1X3 (DISPOSABLE) IMPLANT
SPONGE SURGIFOAM ABS GEL 100 (HEMOSTASIS) ×2 IMPLANT
SPONGE SURGIFOAM ABS GEL 100C (HEMOSTASIS) ×1 IMPLANT
STAPLER VISISTAT 35W (STAPLE) ×2 IMPLANT
STOCKINETTE 6  STRL (DRAPES) ×1
STOCKINETTE 6 STRL (DRAPES) ×1 IMPLANT
SUT ETHILON 3 0 FSL (SUTURE) IMPLANT
SUT NURALON 4 0 TR CR/8 (SUTURE) ×5 IMPLANT
SUT VIC AB 0 CT1 18XCR BRD8 (SUTURE) ×2 IMPLANT
SUT VIC AB 0 CT1 8-18 (SUTURE) ×2
SUT VIC AB 3-0 SH 8-18 (SUTURE) ×3 IMPLANT
SUT VICRYL 3-0 RB1 18 ABS (SUTURE) ×2 IMPLANT
SYR 20ML ECCENTRIC (SYRINGE) ×2 IMPLANT
SYR BULB IRRIGATION 50ML (SYRINGE) ×1 IMPLANT
SYR CONTROL 10ML LL (SYRINGE) ×2 IMPLANT
TAPE CLOTH 1X10 TAN NS (GAUZE/BANDAGES/DRESSINGS) ×1 IMPLANT
TIP NONSTICK .5MMX23CM (INSTRUMENTS) ×2
TIP NONSTICK .5X23 (INSTRUMENTS) IMPLANT
TOWEL OR 17X24 6PK STRL BLUE (TOWEL DISPOSABLE) ×2 IMPLANT
TOWEL OR 17X26 10 PK STRL BLUE (TOWEL DISPOSABLE) ×2 IMPLANT
UNDERPAD 30X30 INCONTINENT (UNDERPADS AND DIAPERS) IMPLANT
WATER STERILE IRR 1000ML POUR (IV SOLUTION) ×2 IMPLANT

## 2015-02-04 NOTE — ED Notes (Signed)
Pt has been NPO since given Tylenol per order, stroke swallow screen completed at 0822.

## 2015-02-04 NOTE — ED Notes (Signed)
MD at bedside. 

## 2015-02-04 NOTE — ED Notes (Signed)
Pt's blood pressure is elevated this morning. States she needs a refill on her BP medication. Complain of headache and sinus pressure

## 2015-02-04 NOTE — H&P (Signed)
CC:  Sudden worst headache of life  HPI: Renee ButteCynthia Y Murray is a 59 y.o. female [facility] after suffering the worst HA of her life suddenly this am. She came home from work and was kneeling down for prayers and suddenly had right-sided HA. She had a second sudden right-sided HA some time later and therefore called EMS and [facility]. She denies associated sx of visual changes, or N/T/W. 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  Of note, the patient does have a hx of HTN, and is a current tobacco smoker (1 pk per week). No FHx of intracranial aneurysms or hemorrhage.  PMH: Past Medical History  Diagnosis Date  . Hypertension     PSH: Past Surgical History  Procedure Laterality Date  . Cholecystectomy    . Abdominal hysterectomy      SH: History  Substance Use Topics  . Smoking status: Current Every Day Smoker -- 0.50 packs/day for 20 years  . Smokeless tobacco: Not on file  . Alcohol Use: Yes     Comment: socially    MEDS: Prior to Admission medications   Medication Sig Start Date End Date Taking? Authorizing Provider  acetaminophen (TYLENOL) 500 MG tablet Take 1,000 mg by mouth every 6 (six) hours as needed for moderate pain.   Yes Historical Provider, MD  fluticasone (FLONASE) 50 MCG/ACT nasal spray Place 2 sprays into the nose daily. Patient taking differently: Place 2 sprays into the nose daily as needed for allergies.  09/27/13  Yes Servando Salinaatherine H Rossi, NP  naproxen sodium (ANAPROX) 220 MG tablet Take 220 mg by mouth daily as needed (pain).   Yes Historical Provider, MD  amLODipine (NORVASC) 5 MG tablet TAKE ONE TABLET BY MOUTH ONCE DAILY 08/27/14   Marrian SalvageJacquelyn S Gill, MD    ALLERGY: Allergies  Allergen Reactions  . Demerol Other (See Comments)    "almost died"  . Morphine And Related Other (See Comments)    "Almost Died"    ROS: Review of Systems  Constitutional:  Negative for fever and chills.  HENT: Positive for congestion. Negative for ear pain, hearing loss, nosebleeds and tinnitus.   Eyes: Negative for blurred vision and double vision.  Respiratory: Negative for cough and shortness of breath.   Cardiovascular: Negative for chest pain, palpitations and leg swelling.  Gastrointestinal: Negative for heartburn, nausea and vomiting.  Genitourinary: Negative for dysuria and urgency.  Musculoskeletal: Positive for neck pain. Negative for myalgias and back pain.  Skin: Negative for rash.  Neurological: Positive for headaches. Negative for dizziness, tingling, tremors, sensory change, speech change, focal weakness, seizures and loss of consciousness.  Endo/Heme/Allergies: Does not bruise/bleed easily.    NEUROLOGIC EXAM: Awake, alert, oriented Memory and concentration grossly intact Speech fluent, appropriate CN grossly intact Motor exam: Upper Extremities Deltoid Bicep Tricep Grip  Right 5/5 5/5 5/5 5/5  Left 5/5 5/5 5/5 5/5   Lower Extremity IP Quad PF DF EHL  Right 5/5 5/5 5/5 5/5 5/5  Left 5/5 5/5 5/5 5/5 5/5   No drift Sensation grossly intact to LT  IMGAING: CT demonstrates diffuse basal and R>L sylvian and convexity SAH. No HCP. CTA demonstrates a bilobed laterally projecting RMCA aneurysm.  IMPRESSION: - 59 y.o. 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  I  spoke at length with the patient and her daughter regarding the imaging findings thus far. I also explained to them the possible treatment options for intracranial aneurysms including endovascular coiling and open clip ligation. The risks of the angiogram, coiling, and surgical clipping were also reviewed to include stroke and aneurysm re-rupture leading to weakness/paralysis/coma/death, infection, SZ, hydrocephalus. I also talked to  them about the possibility of ventriculostomy for CSF drainage and the risks of this procedure.  The patient and her daughter understood our discussion and they provided consent to proceed with diagnostic angiogram and the appropriate treatment for any identified aneurysm as well as ventriculostomy should that become necessary.

## 2015-02-04 NOTE — Op Note (Signed)
PREOP DIAGNOSIS: Right MCA Aneurysm  POSTOP DIAGNOSIS: Same  PROCEDURE: 1. Right frontotemporal craniotomy for clipping of right MCA aneurysm, simple 2. Use of operating microscope for microdissection 3. Use of intraoperative ICG videoangiography   SURGEON: Dr. Lisbeth RenshawNeelesh Dajanique Robley, MD  ASSISTANT: Dr. Maeola HarmanJoseph Stern, MD  ANESTHESIA: General Endotracheal  EBL: 250cc  SPECIMENS: None  DRAINS: None  COMPLICATIONS: None immediate  CONDITION: Hemodynamically stable to ICU  HISTORY: Renee ButteCynthia Y Murray is a 59 y.o. female who presented to the hospital with sudden onset of severe headache. Workup demonstrated subarachnoid hemorrhage, and diagnostic cerebral angiogram was done demonstrating a right middle cerebral artery aneurysm as the cause of hemorrhage. This was not amenable to coil embolization, and the patient was therefore brought for surgical clipping. The risks and benefits of the surgery were reviewed in detail with the patient prior to the procedure, as well as the patient's family. After all questions were answered, informed consent was obtained.  PROCEDURE IN DETAIL: After informed consent was obtained and witnessed, the patient was brought to the operating room. After induction of general anesthesia, the patient was positioned on the operative table in the supine position. All pressure points were meticulously padded. Skin incision was then marked out and prepped and draped in the usual sterile fashion.  After time-out was conducted, skin incision was made sharply and Bovie electrocautery was used to dissect the subcutaneous tissue and galea. Raney clips were then used to secure hemostasis on the skin edges. The superficial temporal artery was dissected free and retracted with the skin flap. Bovie electrocautery was used to dissect through the pericranium as well as the temporalis fascia and muscle. The temporal fat pad was then identified as well as the deep temporal fascia. The skin  flap was then elevated with the temporal fat pad and reflected anteriorly. The temporalis muscle was then incised and reflected inferiorly. Bur holes were then created in the pterion, above the root of the zygoma, and the superior temporal line. These are then connected with the craniotome and a standard pterional craniotomy flap was elevated. Hemostasis was achieved on the bone edges, and a high-speed drill was used to drill down the lesser wing of the sphenoid.  The dura was then opened in curvilinear fashion and good hemostasis was achieved on the dural edges. At this point the microscope was draped and brought into the field and the remainder of the case was done under the microscope using microdissection.  In a lateral subfrontal approach, the optic nerve and the opticocarotid cistern were identified and incised and dissection was carried laterally until the internal carotid artery was identified. At this point the distal sylvian fissure was split in standard fashion, and a large amount of sylvian hematoma was identified and removed. The sylvian fissure was noted to be significantly adherent, and a portion of the right frontal operculum was removed in order to fully split the sylvian fissure. The middle cerebral artery was then identified and traced back to the ICA bifurcation. We then identified the MCA bifurcation, and the 2 M2 branches. The aneurysm was identified at the MCA bifurcation with a small lobe which appeared to be based more on the superior division, and the second larger lobe projecting laterally from that. Meticulous dissection was used to identify the origin of both the M2 branches, as well as the aneurysm neck. Once the aneurysm was dissected free, a standard curved Aesculap aneurysm clip was selected and placed across the neck of the aneurysm in a fashion  parallel to the superior division M2. A second clip was then stacked above this in order to ensure occlusion of the aneurysm neck. The  dome of the aneurysm was then punctured, and no bleeding was observed.   Once the hemostasis was secured using a combination of bipolar electrocautery and passive hemostats, the anesthesia service was instructed to administer a 12.5 mg  of ICG. Under the infrared camera, we confirmed the patency of the M1, and both M2 vessels. No filling of the aneurysm dome was identified.  At this point the wound is irrigated with copious amounts of normal saline irrigation. Good hemostasis was confirmed on the brain surface. The dura was then closed using a combination of interrupted 4-0 Nurolon stitches. A piece of DuraGen was then placed over the dural surface suture line. Muscle was then closed using interrupted 0 Vicryl stitches, and the galea was closed using interrupted 3-0 Vicryl sutures. The skin was closed using standard surgical skin staples. Sterile dressing was then applied after the [place]field head holder was removed. The patient was then transferred to the stretcher and taken to the intensive care unit in stable hemodynamic condition.  At the end of the case all sponge, needle, and instrument and cottonoid counts were correct.

## 2015-02-04 NOTE — Progress Notes (Signed)
5 Fr Exoseal placed to Rt groin by Herbert SetaHeather, Radiology Tech and manual pressure held. Pt remains under anesthesia. No problems. To transfer to OR.

## 2015-02-04 NOTE — Anesthesia Postprocedure Evaluation (Signed)
  Anesthesia Post-op Note  Patient: Renee Murray  Procedure(s) Performed: Procedure(s): RIGHT CRANIOTOMY FOR ANEURYSM  (Right)  Patient Location: ICU  Anesthesia Type:General  Level of Consciousness: awake, sedated and responds to stimulation  Airway and Oxygen Therapy: Patient Spontanous Breathing  Post-op Pain: none  Post-op Assessment: Post-op Vital signs reviewed  Post-op Vital Signs: Reviewed  Last Vitals:  Filed Vitals:   02/04/15 2300  BP: 128/53  Pulse: 87  Temp: 37.3 C  Resp: 17    Complications: No apparent anesthesia complications

## 2015-02-04 NOTE — ED Notes (Signed)
[facility] here for transport 

## 2015-02-04 NOTE — Transfer of Care (Signed)
Immediate Anesthesia Transfer of Care Note  Patient: Renee Murray  Procedure(s) Performed: Procedure(s): RIGHT CRANIOTOMY FOR ANEURYSM  (Right)  Patient Location: NICU  Anesthesia Type:General  Level of Consciousness: sedated  Airway & Oxygen Therapy: Patient Spontanous Breathing and Patient connected to nasal cannula oxygen  Post-op Assessment: Report given to RN and Post -op Vital signs reviewed and stable  Post vital signs: Reviewed and stable  Last Vitals:  Filed Vitals:   02/04/15 1500  BP: 143/64  Pulse: 97  Temp:   Resp: 19    Complications: No apparent anesthesia complications   Responds to stimulation. Opens eyes. Moves all extremities.

## 2015-02-04 NOTE — Progress Notes (Signed)
PREOP DX:  1. Subarachnoid hemorrhage 2. RMCA aneurysm  POSTOP DX: Same  PROCEDURE: Diagnostic cerebral angiogram  SURGEON: Dr. Lisbeth RenshawNeelesh Kaitrin Seybold, MD  ANESTHESIA: GETA  EBL: Minimal  SPECIMENS: None  COMPLICATIONS: None  CONDITION: Stable to recovery  FINDINGS: 1. Bilobed RMCA aneurysm partially incorporating origin of right superior division MCA. 2. Significant mid-cervical RICA tortuosity/FMD 3. No other aneurysms/AVM/fistulas seen. 4. Common origin of innominate and LCCA ("Bovine" arch)

## 2015-02-04 NOTE — ED Notes (Signed)
Pt calling family.

## 2015-02-04 NOTE — ED Provider Notes (Signed)
CSN: [id number]     Arrival date & time 02/04/15  [id number] History  This chart was scribed for Renee CamelScott T Tayven Renteria, MD by Tonye RoyaltyJoshua Chen, ED Scribe. This patient was seen in room APA02/APA02 and the patient's care was started at 8:10 AM.    Chief Complaint  Patient presents with  . Hypertension   The history is provided by the patient. No language interpreter was used.    HPI Comments: Renee Murray is a 59 y.o. female who presents to the Emergency Department complaining of sudden, severe headache upon rising from sitting and leaning over with onset at 0645 this morning after getting off work. She states she then measured her blood pressure and found it was high at 188/87, 200/90 in ambulance. She notes then having another pain to her right head. She states she has never had headache of this intensity. She states her headache is much improved at this time but is still present, rated at 9/10 now. She states she has not used any medication for it. She notes a "funny feeling" to her neck. She requests refill on blood pressure medication and states she has not used any in 2 weeks. She denies fever, blurry vision, nausea, vomiting, weakness, chest pain, or SOB.  Past Medical History  Diagnosis Date  . Hypertension    Past Surgical History  Procedure Laterality Date  . Cholecystectomy    . Abdominal hysterectomy     No family history on file. History  Substance Use Topics  . Smoking status: Current Every Day Smoker -- 0.50 packs/day for 20 years  . Smokeless tobacco: Not on file  . Alcohol Use: Yes     Comment: socially   OB History    No data available     Review of Systems  Constitutional: Negative for fever.  Eyes: Negative for visual disturbance.  Respiratory: Negative for shortness of breath.   Cardiovascular: Negative for chest pain.  Gastrointestinal: Negative for nausea and vomiting.  Musculoskeletal: Negative for neck pain.  Neurological: Positive for headaches. Negative for  weakness and numbness.  All other systems reviewed and are negative.     Allergies  Demerol; Morphine and related; and Other  Home Medications   Prior to Admission medications   Medication Sig Start Date End Date Taking? Authorizing Provider  amLODipine (NORVASC) 5 MG tablet TAKE ONE TABLET BY MOUTH ONCE DAILY 08/27/14   Marrian SalvageJacquelyn S Gill, MD  fluticasone (FLONASE) 50 MCG/ACT nasal spray Place 2 sprays into the nose daily. 09/27/13   Servando Salinaatherine H Rossi, NP   BP 180/83 mmHg  Pulse 63  Temp(Src) 97.8 F (36.6 C) (Oral)  Resp 18  Ht 4\' 11"  (1.499 m)  Wt 160 lb (72.576 kg)  BMI 32.30 kg/m2  SpO2 100% Physical Exam  Constitutional: She is oriented to person, place, and time. She appears well-developed and well-nourished.  HENT:  Head: Normocephalic and atraumatic.  Right Ear: External ear normal.  Left Ear: External ear normal.  Nose: Nose normal.  Eyes: EOM are normal. Pupils are equal, round, and reactive to light. Right eye exhibits no discharge. Left eye exhibits no discharge.  Neck: Normal range of motion. Neck supple.  No meningismus   Cardiovascular: Normal rate, regular rhythm and normal heart sounds.   No murmur heard. Pulmonary/Chest: Effort normal and breath sounds normal.  Abdominal: She exhibits no distension. There is no tenderness.  Neurological: She is alert and oriented to person, place, and time.  Cranial nerves 2-12 grossly intact 5/5  strength in all 4 extremities Normal finger to nose No pronator drift  Skin: Skin is warm and dry.  Psychiatric: She has a normal mood and affect.  Nursing note and vitals reviewed.   ED Course  Procedures (including critical care time)  DIAGNOSTIC STUDIES: Oxygen Saturation is 100% on room air, normal by my interpretation.    COORDINATION OF CARE: 8:18 AM Discussed treatment plan with patient at beside, including blood work and CT scan of her head. The patient agrees with the plan and has no further questions at this  time.   Labs Review Labs Reviewed  BASIC METABOLIC PANEL - Abnormal; Notable for the following:    Glucose, Bld 105 (*)    Anion gap 4 (*)    All other components within normal limits  CBC WITH DIFFERENTIAL/PLATELET  PROTIME-INR  APTT    Imaging Review Ct Angio Head W/cm &/or Wo Cm  02/04/2015   CLINICAL DATA:  59 year old female with acute subarachnoid hemorrhage, aneurysmal bleed pattern. Headache. Initial encounter.  EXAM: CT ANGIOGRAPHY HEAD  TECHNIQUE: Multidetector CT imaging of the head was performed using the standard protocol during bolus administration of intravenous contrast. Multiplanar CT image reconstructions and MIPs were obtained to evaluate the vascular anatomy.  CONTRAST:  80mL OMNIPAQUE IOHEXOL 350 MG/ML SOLN  COMPARISON:  None.  FINDINGS: CT HEAD  Brain: Stable moderate volume of subarachnoid hemorrhage, most pronounced in the right basilar cistern and sylvian fissure as before. No intraventricular hemorrhage identified. Posterior fossa cisterns remain patent. No midline shift. Stable gray-white matter differentiation throughout the brain. No abnormal enhancement identified.  Calvarium and skull base: Stable.  Paranasal sinuses: Stable.  Orbits: Negative.  CTA HEAD  Posterior circulation: Codominant distal vertebral arteries. Normal PICA origins. Normal vertebrobasilar junction. No basilar stenosis. SCA and PCA origins are within normal limits. Posterior communicating arteries are diminutive or absent. Bilateral PCA branches are within normal limits.  Anterior circulation: Tortuous distal cervical ICAs. Patent ICA siphons. No siphon stenosis. Ophthalmic artery origins are within normal limits. Patent carotid termini. MCA and ACA origins are within normal limits.  The anterior communicating artery appears mildly ectatic but without definite aneurysm. There is azygos type ACA A2 anatomy. Otherwise negative ACA branches. Left MCA branches are within normal limits.  Right MCA M1  segment is normal to the bifurcation. At the right MCA bifurcation there is an irregular bilobed aneurysm directed laterally and inferiorly. The aneurysm encompasses 7 x 5 x 7 mm. It appears to involve the origin of the dominant anterior right M2 division. Coronal images suggest the aneurysm is separate from the dominant posterior M2 division.  Venous sinuses: Within normal limits.  Anatomic variants: Azygos type ACA A2 anatomy.  IMPRESSION: 1. Positive for irregular bilobed right MCA bifurcation aneurysm encompassing 7 x 5 x 7 mm. The constellation is consistent with ruptured right MCA aneurysm. Critical Value/emergent results were called by telephone at the time of interpretation on 02/04/2015 at 0950 hours to Dr. Pricilla Loveless , who verbally acknowledged these results. 2. Stable subarachnoid hemorrhage. 3. Other circle of Willis branch is within normal limits; mild ectasia of the anterior communicating artery and azygos type A2 anatomy.   Electronically Signed   By: Odessa Fleming M.D.   On: 02/04/2015 10:07   Ct Head Wo Contrast  02/04/2015   CLINICAL DATA:  Hypertension, headaches  EXAM: CT HEAD WITHOUT CONTRAST  TECHNIQUE: Contiguous axial images were obtained from the base of the skull through the vertex without intravenous contrast.  COMPARISON:  07/27/2014  FINDINGS: The bony calvarium is intact. No gross soft tissue abnormality is noted. The paranasal sinuses and mastoid air cells are within normal limits.  There are changes consistent with significant subarachnoid hemorrhage predominately on the right within the sylvian fissure and throughout the basal cisterns with some extension towards the left. No significant mass-effect is noted no midline shift is seen. No mass lesion is noted.  IMPRESSION: Significant subarachnoid hemorrhage predominately on the right side with significant blood noted within the sylvian fissure. These changes suggest the possibility of a the right middle cerebral artery aneurysm.   Critical Value/emergent results were called by telephone at the time of interpretation on 02/04/2015 at 9:12 am to Dr. Pricilla Loveless , who verbally acknowledged these results.   Electronically Signed   By: Alcide Clever M.D.   On: 02/04/2015 09:12     EKG Interpretation   Date/Time:  Thursday February 04 2015 09:12:38 EST Ventricular Rate:  65 PR Interval:  139 QRS Duration: 70 QT Interval:  402 QTC Calculation: 418 R Axis:   72 Text Interpretation:  Sinus rhythm Probable LVH with secondary repol abnrm  Baseline wander in lead(s) V2 no significant change since Aug 2015  Confirmed by Criss Alvine  MD, Chanise Habeck ([id number]) on 02/04/2015 9:34:46 AM      CRITICAL CARE Performed by: Pricilla Loveless T   Total critical care time: 60 minutes  Critical care time was exclusive of separately billable procedures and treating other patients.  Critical care was necessary to treat or prevent imminent or life-threatening deterioration.  Critical care was time spent personally by me on the following activities: development of treatment plan with patient and/or surrogate as well as nursing, discussions with consultants, evaluation of patient's response to treatment, examination of patient, obtaining history from patient or surrogate, ordering and performing treatments and interventions, ordering and review of laboratory studies, ordering and review of radiographic studies, pulse oximetry and re-evaluation of patient's condition.  MDM   Final diagnoses:  Subarachnoid hemorrhage  Subarachnoid hemorrhage    CT shows large subarachnoid hemorrhage, as above. Given nicardipine gtt to control BP, which worked effectively. Not on any blood thinners. Delay in getting neurosurgery consult due to phone issues, eventually able to secure neuro ICU bed and transfer. Remained neurologically intact, headache improved with initial tylenol and she declined further meds. Neurologically intact upon transfer to [facility]. Dr.  Venetia Maxon of neurosurgery viewed images and accepts in transfer.  I personally performed the services described in this documentation, which was scribed in my presence. The recorded information has been reviewed and is accurate.   Renee Camel, MD 02/04/15 1754

## 2015-02-04 NOTE — Anesthesia Procedure Notes (Signed)
Procedure Name: Intubation Date/Time: 02/04/2015 4:02 PM Performed by: Margaree MackintoshYACOUB, Kian Gamarra B Pre-anesthesia Checklist: Patient identified, Emergency Drugs available, Suction available, Patient being monitored and Timeout performed Patient Re-evaluated:Patient Re-evaluated prior to inductionOxygen Delivery Method: Circle system utilized Preoxygenation: Pre-oxygenation with 100% oxygen Intubation Type: IV induction and Rapid sequence Laryngoscope Size: Mac and 3 Grade View: Grade I Tube type: Subglottic suction tube Tube size: 7.0 mm Number of attempts: 1 Airway Equipment and Method: Stylet Placement Confirmation: ETT inserted through vocal cords under direct vision,  positive ETCO2 and breath sounds checked- equal and bilateral Secured at: 20 cm Tube secured with: Tape Dental Injury: Teeth and Oropharynx as per pre-operative assessment

## 2015-02-04 NOTE — Anesthesia Preprocedure Evaluation (Addendum)
Anesthesia Evaluation  Patient identified by MRN, date of birth, ID band Patient awake    Reviewed: Allergy & Precautions, H&P , NPO status , Patient's Chart, lab work & pertinent test results  Airway Mallampati: II  TM Distance: >3 FB Neck ROM: Full    Dental no notable dental hx. (+) Upper Dentures, Lower Dentures, Dental Advisory Given   Pulmonary Current Smoker,  breath sounds clear to auscultation  Pulmonary exam normal       Cardiovascular hypertension, Pt. on medications Rhythm:Regular Rate:Normal     Neuro/Psych MCA aneurysm negative psych ROS   GI/Hepatic negative GI ROS, Neg liver ROS,   Endo/Other  negative endocrine ROS  Renal/GU negative Renal ROS  negative genitourinary   Musculoskeletal   Abdominal   Peds  Hematology negative hematology ROS (+)   Anesthesia Other Findings   Reproductive/Obstetrics negative OB ROS                           Anesthesia Physical Anesthesia Plan  ASA: III and emergent  Anesthesia Plan: General   Post-op Pain Management:    Induction: Intravenous  Airway Management Planned: Oral ETT  Additional Equipment: Arterial line and CVP  Intra-op Plan:   Post-operative Plan: Extubation in OR and Possible Post-op intubation/ventilation  Informed Consent: I have reviewed the patients History and Physical, chart, labs and discussed the procedure including the risks, benefits and alternatives for the proposed anesthesia with the patient or authorized representative who has indicated his/her understanding and acceptance.   Dental advisory given  Plan Discussed with: CRNA  Anesthesia Plan Comments:        Anesthesia Quick Evaluation

## 2015-02-04 NOTE — ED Notes (Signed)
Lab notified about add on PTT/PT.

## 2015-02-04 NOTE — ED Notes (Signed)
Report given to [facility] 

## 2015-02-05 ENCOUNTER — Encounter (HOSPITAL_COMMUNITY): Payer: Self-pay | Admitting: Certified Registered Nurse Anesthetist

## 2015-02-05 LAB — POCT I-STAT 7, (LYTES, BLD GAS, ICA,H+H)
ACID-BASE DEFICIT: 3 mmol/L — AB (ref 0.0–2.0)
Acid-base deficit: 5 mmol/L — ABNORMAL HIGH (ref 0.0–2.0)
Acid-base deficit: 4 mmol/L — ABNORMAL HIGH (ref 0.0–2.0)
BICARBONATE: 21.8 meq/L (ref 20.0–24.0)
Bicarbonate: 19 mEq/L — ABNORMAL LOW (ref 20.0–24.0)
Bicarbonate: 19.6 mEq/L — ABNORMAL LOW (ref 20.0–24.0)
CALCIUM ION: 1.01 mmol/L — AB (ref 1.13–1.30)
Calcium, Ion: 1.1 mmol/L — ABNORMAL LOW (ref 1.13–1.30)
Calcium, Ion: 1.05 mmol/L — ABNORMAL LOW (ref 1.13–1.30)
HCT: 35 % — ABNORMAL LOW (ref 36.0–46.0)
HCT: 33 % — ABNORMAL LOW (ref 36.0–46.0)
HEMATOCRIT: 29 % — AB (ref 36.0–46.0)
HEMOGLOBIN: 11.9 g/dL — AB (ref 12.0–15.0)
HEMOGLOBIN: 9.9 g/dL — AB (ref 12.0–15.0)
Hemoglobin: 11.2 g/dL — ABNORMAL LOW (ref 12.0–15.0)
O2 SAT: 100 %
O2 Saturation: 100 %
O2 Saturation: 100 %
PH ART: 7.392 (ref 7.350–7.450)
PH ART: 7.423 (ref 7.350–7.450)
PH ART: 7.413 (ref 7.350–7.450)
PO2 ART: 384 mmHg — AB (ref 80.0–100.0)
Patient temperature: 35.5
Patient temperature: 36
Potassium: 3.4 mmol/L — ABNORMAL LOW (ref 3.5–5.1)
Potassium: 3.5 mmol/L (ref 3.5–5.1)
Potassium: 3.5 mmol/L (ref 3.5–5.1)
SODIUM: 143 mmol/L (ref 135–145)
SODIUM: 135 mmol/L (ref 135–145)
Sodium: 141 mmol/L (ref 135–145)
TCO2: 23 mmol/L (ref 0–100)
TCO2: 20 mmol/L (ref 0–100)
TCO2: 21 mmol/L (ref 0–100)
pCO2 arterial: 35.9 mmHg (ref 35.0–45.0)
pCO2 arterial: 28.7 mmHg — ABNORMAL LOW (ref 35.0–45.0)
pCO2 arterial: 30.4 mmHg — ABNORMAL LOW (ref 35.0–45.0)
pO2, Arterial: 360 mmHg — ABNORMAL HIGH (ref 80.0–100.0)
pO2, Arterial: 349 mmHg — ABNORMAL HIGH (ref 80.0–100.0)

## 2015-02-05 MED ORDER — PANTOPRAZOLE SODIUM 40 MG PO TBEC
40.0000 mg | DELAYED_RELEASE_TABLET | Freq: Every day | ORAL | Status: DC
  Administered 2015-02-06 – 2015-02-16 (×11): 40 mg via ORAL
  Filled 2015-02-05 (×11): qty 1

## 2015-02-05 MED ORDER — ATORVASTATIN CALCIUM 10 MG PO TABS
20.0000 mg | ORAL_TABLET | Freq: Every day | ORAL | Status: DC
  Administered 2015-02-05 – 2015-02-16 (×10): 20 mg via ORAL
  Filled 2015-02-05 (×4): qty 1
  Filled 2015-02-05: qty 2
  Filled 2015-02-05 (×7): qty 1

## 2015-02-05 MED ORDER — SIMVASTATIN 40 MG PO TABS
40.0000 mg | ORAL_TABLET | Freq: Every day | ORAL | Status: DC
  Filled 2015-02-05: qty 1

## 2015-02-05 MED ORDER — PANTOPRAZOLE SODIUM 40 MG PO PACK
40.0000 mg | PACK | Freq: Every day | ORAL | Status: DC
  Filled 2015-02-05: qty 20

## 2015-02-05 MED ORDER — INFLUENZA VAC SPLIT QUAD 0.5 ML IM SUSY
0.5000 mL | PREFILLED_SYRINGE | INTRAMUSCULAR | Status: DC
  Filled 2015-02-05: qty 0.5

## 2015-02-05 MED ORDER — CETYLPYRIDINIUM CHLORIDE 0.05 % MT LIQD
7.0000 mL | Freq: Two times a day (BID) | OROMUCOSAL | Status: DC
  Administered 2015-02-05: 7 mL via OROMUCOSAL

## 2015-02-05 NOTE — Progress Notes (Signed)
Pt seen and examined. No issues overnight. Pt reports minimal HA, and mild right jaw pain. No visual changes, no new N/T/W.  EXAM: Temp:  [97.8 F (36.6 C)-99.8 F (37.7 C)] 98.5 F (36.9 C) (02/19 0755) Pulse Rate:  [63-97] 78 (02/19 0800) Resp:  [12-24] 15 (02/19 0800) BP: (94-175)/(49-85) 122/55 mmHg (02/19 0800) SpO2:  [91 %-100 %] 97 % (02/19 0800) Arterial Line BP: (76-157)/(47-90) 94/90 mmHg (02/19 0800) Weight:  [66.1 kg (145 lb 11.6 oz)] 66.1 kg (145 lb 11.6 oz) (02/18 1143) Intake/Output      02/18 0701 - 02/19 0700 02/19 0701 - 02/20 0700   I.V. (mL/kg) 3812.7 (57.7) 125 (1.9)   Total Intake(mL/kg) 3812.7 (57.7) 125 (1.9)   Urine (mL/kg/hr) 3200    Blood 275    Total Output 3475     Net +337.7 +125         Awake, alert, oriented Speech fluent CN intact Good strength throughout, no drift  LABS: Lab Results  Component Value Date   CREATININE 0.56 02/04/2015   BUN 12 02/04/2015   NA 141 02/04/2015   K 3.5 02/04/2015   CL 107 02/04/2015   CO2 27 02/04/2015   Lab Results  Component Value Date   WBC 6.7 02/04/2015   HGB 11.2* 02/04/2015   HCT 33.0* 02/04/2015   MCV 90.7 02/04/2015   PLT 169 02/04/2015    IMPRESSION: - 59 y.o. female SAH d#2 s/p RMCA clipping, doing well  PLAN: - Cont observation for spasm - Nimotop/zocor, TCD monitoring - Regular diet - Mobilize

## 2015-02-05 NOTE — Addendum Note (Signed)
Addendum  created 02/05/15 1453 by Minus LibertyBritney Virginia Curl, CRNA   Modules edited: Charges VN

## 2015-02-05 NOTE — Progress Notes (Addendum)
Transcranial Doppler  Date POD PCO2 HCT BP  MCA ACA PCA OPHT SIPH VERT Basilar  02/05/15 JE     Right  Left   79  81   -61     38  37   27     48  49   -25  -30              Right  Left                                            Right  Left                                             Right  Left                                             Right  Left                                            Right  Left                                            Right  Left                                        MCA = Middle Cerebral Artery      OPHT = Opthalmic Artery     BASILAR = Basilar Artery   ACA = Anterior Cerebral Artery     SIPH = Carotid Siphon PCA = Posterior Cerebral Artery   VERT = Verterbral Artery                   Normal MCA = 62+\-12 ACA = 50+\-12 PCA = 42+\-23   2/19 Unable to insonate left ophthalmic, left ACA, and basilar arteries. JE

## 2015-02-06 ENCOUNTER — Encounter (HOSPITAL_COMMUNITY): Payer: Self-pay | Admitting: Neurosurgery

## 2015-02-06 MED ORDER — HYDROCODONE-ACETAMINOPHEN 5-325 MG PO TABS
1.0000 | ORAL_TABLET | ORAL | Status: DC | PRN
  Administered 2015-02-06 – 2015-02-17 (×37): 1 via ORAL
  Filled 2015-02-06 (×37): qty 1

## 2015-02-06 NOTE — Progress Notes (Signed)
Patient ID: Renee ButteCynthia Y Plascencia, female   DOB: 02/09/1956, 59 y.o.   MRN: [id number] Afeb, vss No new neuro issues She is awake, alert, follows complex commands.  She is doing well pod 2 after aneurysm clipping.  Will continue present rx and watch for signs of vassospasm.

## 2015-02-06 NOTE — Evaluation (Signed)
Physical Therapy Evaluation Patient Details Name: Renee Murray MRN: [id number] DOB: 1956-10-25 Today's Date: 02/06/2015   History of Present Illness  pt presents with [facility] post Crani with R MCA clipping.    Clinical Impression  Pt moving well, but a little nervous during mobility and held her arm around PT's hips for "security".  Discussed potential need for RW pending pt's continued progress.  Will continue to follow.      Follow Up Recommendations Home health PT;Supervision - Intermittent    Equipment Recommendations  Rolling walker with 5" wheels    Recommendations for Other Services       Precautions / Restrictions Precautions Precautions: Fall Restrictions Weight Bearing Restrictions: No      Mobility  Bed Mobility Overal bed mobility: Needs Assistance Bed Mobility: Supine to Sit     Supine to sit: Min guard     General bed mobility comments: cues for sequencing and encouragement.    Transfers Overall transfer level: Needs assistance Equipment used: None Transfers: Sit to/from Stand Sit to Stand: Min guard         General transfer comment: cues for UE use with pt definitly needing UE support.    Ambulation/Gait Ambulation/Gait assistance: Min guard Ambulation Distance (Feet): 150 Feet Assistive device: None Gait Pattern/deviations: Step-through pattern;Decreased stride length     General Gait Details: pt moved slowly and kept her arm around PT's hips for "security", but didn't seem to use PT for support.  Discussed use of RW for future ambulation pending pt's progress while on acute.    Stairs            Wheelchair Mobility    Modified Rankin (Stroke Patients Only)       Balance Overall balance assessment: Needs assistance Sitting-balance support: No upper extremity supported;Feet supported Sitting balance-Leahy Scale: Good     Standing balance support: No upper extremity supported Standing balance-Leahy Scale: Fair                                Pertinent Vitals/Pain Pain Assessment: 0-10 Pain Score: 2  Pain Location: Headache Pain Descriptors / Indicators: Headache Pain Intervention(s): Monitored during session;Premedicated before session;Repositioned    Home Living Family/patient expects to be discharged to:: Private residence Living Arrangements: Alone Available Help at Discharge: Family;Friend(s);Available PRN/intermittently (24hr for first few days, then PRN.) Type of Home: House Home Access: Stairs to enter Entrance Stairs-Rails: Doctor, general practice of Steps: 8 Home Layout: One level Home Equipment: None      Prior Function Level of Independence: Independent               Hand Dominance        Extremity/Trunk Assessment   Upper Extremity Assessment: Defer to OT evaluation           Lower Extremity Assessment: Generalized weakness      Cervical / Trunk Assessment: Kyphotic  Communication   Communication: No difficulties  Cognition Arousal/Alertness: Awake/alert Behavior During Therapy: WFL for tasks assessed/performed Overall Cognitive Status: Within Functional Limits for tasks assessed                      General Comments      Exercises        Assessment/Plan    PT Assessment Patient needs continued PT services  PT Diagnosis Difficulty walking   PT Problem List Decreased strength;Decreased activity tolerance;Decreased balance;Decreased mobility;Decreased coordination;Decreased knowledge of  use of DME  PT Treatment Interventions DME instruction;Gait training;Stair training;Functional mobility training;Therapeutic activities;Therapeutic exercise;Balance training;Neuromuscular re-education;Patient/family education   PT Goals (Current goals can be found in the Care Plan section) Acute Rehab PT Goals Patient Stated Goal: Home PT Goal Formulation: With patient Time For Goal Achievement: 02/13/15 Potential to Achieve Goals: Good     Frequency Min 3X/week   Barriers to discharge        Co-evaluation               End of Session Equipment Utilized During Treatment: Gait belt Activity Tolerance: Patient tolerated treatment well Patient left: in chair;with call bell/phone within reach Nurse Communication: Mobility status         Time: [id number] PT Time Calculation (min) (ACUTE ONLY): 22 min   Charges:   PT Evaluation $Initial PT Evaluation Tier I: 1 Procedure     PT G CodesSunny Murray:        Renee Murray F, [place] [phone] 02/06/2015, 10:13 AM

## 2015-02-07 NOTE — Progress Notes (Signed)
Physical Therapy Treatment Patient Details Name: Marcelene ButteCynthia Y Jillson MRN: [id number] DOB: 12/30/1955 Today's Date: 02/07/2015    History of Present Illness      PT Comments    Pt highly distracted today and difficult to keep on task during PT session. She mainly focused on her cell phone and made phone calls during PT despite verbal cues to stay on task.  Family unavailable to advise whether this is typical behavior or new onset.  Gait distance limited by pt's inability to attend to PT session.  Follow Up Recommendations  Home health PT;Supervision - Intermittent     Equipment Recommendations  Rolling walker with 5" wheels    Recommendations for Other Services       Precautions / Restrictions Precautions Precautions: Fall    Mobility  Bed Mobility         Supine to sit: Min guard     General bed mobility comments: cues for safety  Transfers   Equipment used: None Transfers: Stand Pivot Transfers Sit to Stand: Min guard Stand pivot transfers: Min guard       General transfer comment: verbal cues for safety/sequencing  Ambulation/Gait Ambulation/Gait assistance: Min guard Ambulation Distance (Feet): 50 Feet Assistive device: None Gait Pattern/deviations: Step-through pattern;Decreased stride length     General Gait Details: steady, without LOB, pt less hesitant today with mobility   Stairs            Wheelchair Mobility    Modified Rankin (Stroke Patients Only)       Balance   Sitting-balance support: No upper extremity supported;Feet unsupported Sitting balance-Leahy Scale: Good     Standing balance support: No upper extremity supported;During functional activity Standing balance-Leahy Scale: Fair                      Cognition Arousal/Alertness: Awake/alert Behavior During Therapy: WFL for tasks assessed/performed Overall Cognitive Status: No family/caregiver present to determine baseline cognitive functioning                       Exercises      General Comments        Pertinent Vitals/Pain Pain Assessment: 0-10 Pain Score: 3  Pain Descriptors / Indicators: Headache Pain Intervention(s): Monitored during session;Premedicated before session;Repositioned    Home Living                      Prior Function            PT Goals (current goals can now be found in the care plan section) Progress towards PT goals: Progressing toward goals    Frequency  Min 3X/week    PT Plan Current plan remains appropriate    Co-evaluation             End of Session Equipment Utilized During Treatment: Gait belt Activity Tolerance: Patient tolerated treatment well Patient left: in chair;with call bell/phone within reach     Time: 1258-1314 PT Time Calculation (min) (ACUTE ONLY): 16 min  Charges:  $Gait Training: 8-22 mins                    G Codes:      Ilda FoilGarrow, Leonardo Plaia Rene 02/07/2015, 2:23 PM

## 2015-02-07 NOTE — Progress Notes (Signed)
Patient ID: Renee Murray, female   DOB: 03/07/1956, 59 y.o.   MRN: [id number] Subjective: Patient reports feeling good  Objective: Vital signs in last 24 hours: Temp:  [98.4 F (36.9 C)-98.7 F (37.1 C)] 98.4 F (36.9 C) (02/21 0345) Pulse Rate:  [53-90] 65 (02/21 0700) Resp:  [12-25] 17 (02/21 0700) BP: (112-142)/(38-78) 132/55 mmHg (02/21 0700) SpO2:  [90 %-96 %] 93 % (02/21 0700)  Intake/Output from previous day: 02/20 0701 - 02/21 0700 In: 3340 [P.O.:1540; I.V.:1800] Out: 1950 [Urine:1950] Intake/Output this shift:    remains awake, alert, and conversant  Lab Results:  Recent Labs  02/04/15 1905 02/04/15 2002  HGB 9.9* 11.2*  HCT 29.0* 33.0*   BMET  Recent Labs  02/04/15 1905 02/04/15 2002  NA 135 141  K 3.5 3.5    Studies/Results: No results found.  Assessment/Plan: Doing well. No clinical signs of spasm; will continue present rx. Will increase fluid slightly   LOS: 3 days  as above   Reinaldo MeekerKRITZER,Nikea Settle O, MD 02/07/2015, 10:22 AM

## 2015-02-08 DIAGNOSIS — I609 Nontraumatic subarachnoid hemorrhage, unspecified: Secondary | ICD-10-CM | POA: Insufficient documentation

## 2015-02-08 LAB — TYPE AND SCREEN
ABO/RH(D): O NEG
Antibody Screen: NEGATIVE
UNIT DIVISION: 0
Unit division: 0

## 2015-02-08 NOTE — Progress Notes (Signed)
Physical Therapy Treatment Patient Details Name: Renee Murray MRN: [id number] DOB: 01/30/1956 Today's Date: 02/08/2015    History of Present Illness pt presents with [facility] post Crani with R MCA clipping.      PT Comments    Pt moving well and with improved balance than when this PT saw pt on Saturday.  Pt able to perform stairs and ready for D/C from PT standpoint.    Follow Up Recommendations  Home health PT;Supervision - Intermittent     Equipment Recommendations  None recommended by PT    Recommendations for Other Services       Precautions / Restrictions Precautions Precautions: Fall Restrictions Weight Bearing Restrictions: No    Mobility  Bed Mobility Overal bed mobility: Modified Independent             General bed mobility comments: up in recliner upon arrival  Transfers Overall transfer level: Needs assistance Equipment used: None Transfers: Sit to/from Stand Sit to Stand: Supervision         General transfer comment: verbal cues for safety/sequencing  Ambulation/Gait Ambulation/Gait assistance: Supervision Ambulation Distance (Feet): 200 Feet Assistive device: None Gait Pattern/deviations: Step-through pattern;Decreased stride length     General Gait Details: steady, without LOB, pt less hesitant today with mobility   Stairs Stairs: Yes Stairs assistance: Supervision Stair Management: One rail Left;Alternating pattern;Forwards Number of Stairs: 4 General stair comments: pt demos good safety on stairs.    Wheelchair Mobility    Modified Rankin (Stroke Patients Only)       Balance Overall balance assessment: Needs assistance Sitting-balance support: No upper extremity supported;Feet supported Sitting balance-Leahy Scale: Good     Standing balance support: No upper extremity supported;During functional activity Standing balance-Leahy Scale: Good                      Cognition Arousal/Alertness: Awake/alert Behavior  During Therapy: WFL for tasks assessed/performed Overall Cognitive Status: Within Functional Limits for tasks assessed                      Exercises      General Comments        Pertinent Vitals/Pain Pain Assessment: No/denies pain Pain Score: 1  Pain Location: head Pain Descriptors / Indicators: Pressure (when she bends forward) Pain Intervention(s): Monitored during session;Repositioned    Home Living Family/patient expects to be discharged to:: Private residence Living Arrangements: Alone Available Help at Discharge: Family;Friend(s);Available PRN/intermittently (daughter currently living at pt's house) Type of Home: House Home Access: Stairs to enter Entrance Stairs-Rails: Right;Left Home Layout: One level Home Equipment: Tub bench      Prior Function Level of Independence: Independent      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[employer] (current goals can now be found in the care plan section) Acute Rehab PT Goals Patient Stated Goal: home PT Goal Formulation: With patient Time For Goal Achievement: 02/13/15 Potential to Achieve Goals: Good Progress towards PT goals: Progressing toward goals    Frequency  Min 3X/week    PT Plan Equipment recommendations need to be updated    Co-evaluation             End of Session Equipment Utilized During Treatment: Gait belt Activity Tolerance: Patient tolerated treatment well Patient left: in bed;with call bell/phone within reach     Time: 1056-1120 PT Time Calculation (min) (ACUTE ONLY): 24 min  Charges:  $Gait Training: 23-37 mins  G CodesSunny Murray:      Renee Murray, [place] [phone] 02/08/2015, 12:00 PM

## 2015-02-08 NOTE — Evaluation (Signed)
Occupational Therapy Evaluation Patient Details Name: Marcelene ButteCynthia Y Mangrum MRN: [id number] DOB: 03/31/1956 Today's Date: 02/08/2015    [facility] of Present [facility]ness [facility].     Clinical Impression   This 59 yo female admitted and underwent above presents to acute OT with decreased mobility and decreased balance all affecting her ability to take care of herself at home and go back to work at an independent level as she was pta. She will benefit from acute OT with follow up HHOT to get back to an independent level.    Follow Up Recommendations  Home health OT (for IADLs)    Equipment Recommendations  None recommended by OT       Precautions / Restrictions Precautions Precautions: Fall Restrictions Weight Bearing Restrictions: No      Mobility Bed Mobility               General bed mobility comments: up in recliner upon arrival  Transfers Overall transfer level: Needs assistance Equipment used: None Transfers: Sit to/from Stand Sit to Stand: Supervision              Balance Overall balance assessment: Needs assistance Sitting-balance support: No upper extremity supported;Feet supported Sitting balance-Leahy Scale: Good     Standing balance support: No upper extremity supported Standing balance-Leahy Scale: Good                              ADL Overall ADL's : Needs assistance/impaired Eating/Feeding: Independent;Sitting   Grooming: Set up;Supervision/safety;Standing   Upper Body Bathing: Set up;Sitting   Lower Body Bathing: Set up;Supervison/ safety;Sit to/from stand   Upper Body Dressing : Set up;Sitting   Lower Body Dressing: Set up;Supervision/safety;Sit to/from stand   Toilet Transfer: Min guard;Ambulation;Regular Toilet;Grab bars   Toileting- Clothing Manipulation and Hygiene: Set up;Supervision/safety;Sit to/from stand                         Pertinent Vitals/Pain Pain Assessment:  0-10 Pain Score: 1  Pain Location: head Pain Descriptors / Indicators: Pressure (when she bends forward) Pain Intervention(s): Monitored during session;Repositioned     Hand Dominance Right   Extremity/Trunk Assessment Upper Extremity Assessment Upper Extremity Assessment: Overall WFL for tasks assessed           Communication Communication Communication: No difficulties   Cognition Arousal/Alertness: Awake/alert Behavior During Therapy: WFL for tasks assessed/performed Overall Cognitive Status: Within Functional Limits for tasks assessed                                Home Living Family/patient expects to be discharged to:: Private residence Living Arrangements: Alone Available Help at Discharge: Family;Friend(s);Available PRN/intermittently (daughter currently living at pt's house) Type of Home: House Home Access: Stairs to enter Enter[employer] CorporationEntrance Stairs-Number of Steps: 8 Entrance Stairs-Rails: Right;Left Home Layout: One level     Bathroom Shower/Tub: Chief Strategy OfficerTub/shower unit   Bathroom Toilet: Handicapped height     Home Equipment: Tub bench          Prior Functioning/Environment Level of Independence: Independent        Comments: works as a LawyerCNA at [employer]    OT Diagnosis: Generalized weakness   OT Problem List: Decreased strength;Impaired balance (sitting and/or standing)   OT Treatment/Interventions: Self-care/ADL training;DME and/or AE instruction;Patient/family education;Balance training    OT Goals(Current goals  can be found in the care plan section) Acute Rehab OT Goals Patient Stated Goal: home OT Goal Formulation: With patient Time For Goal Achievement: 02/15/15 Potential to Achieve Goals: Good  OT Frequency: Min 2X/week              End of Session Equipment Utilized During Treatment:  (none) Nurse Communication:  (pt has on mess underwear with pad)  Activity Tolerance: Patient tolerated treatment well Patient left: in chair;with  call bell/phone within reach   Time: 0828-0854 OT Time Calculation (min): 26 min Charges:  OT General Charges $OT Visit: 1 Procedure OT Treatments $Self Care/Home Management : 8-22 mins  Evette Georges [phone] 02/08/2015, 9:14 AM

## 2015-02-08 NOTE — Progress Notes (Signed)
Subjective: Patient reports doing well  Objective: Vital signs in last 24 hours: Temp:  [98.2 F (36.8 C)-99 F (37.2 C)] 99 F (37.2 C) (02/22 0730) Pulse Rate:  [63-88] 73 (02/22 0800) Resp:  [14-23] 16 (02/22 0800) BP: (126-158)/(53-81) 136/62 mmHg (02/22 0800) SpO2:  [92 %-98 %] 97 % (02/22 0800)  Intake/Output from previous day: 02/21 0701 - 02/22 0700 In: 2675.4 [P.O.:360; I.V.:2315.4] Out: 1500 [Urine:1500] Intake/Output this shift:    Physical Exam: Awake, alert, conversant.  No drift, up to chair  Lab Results: No results for input(s): WBC, HGB, HCT, PLT in the last 72 hours. BMET No results for input(s): NA, K, CL, CO2, GLUCOSE, BUN, CREATININE, CALCIUM in the last 72 hours.  Studies/Results: No results found.  Assessment/Plan: Making good progress.  TCDs today.    LOS: 4 days    Dorian HeckleSTERN,Jasin Brazel D, MD 02/08/2015, 9:23 AM

## 2015-02-08 NOTE — Progress Notes (Signed)
UR completed.  Therapies recommending HH at d/c.   Carlyle LipaMichelle Weston Fulco, RN BSN MHA CCM Trauma/Neuro ICU Case Manager [phone]

## 2015-02-08 NOTE — Progress Notes (Signed)
Transcranial Doppler  Date POD PCO2 HCT BP  MCA ACA PCA OPHT SIPH VERT Basilar  02/05/15 JE     Right  Left   79  81   -61  *   38  37   27  *   48  49   -25  -30   *      02/08/15 MS     Right  Left   *  84   *  *   *  *   21  18   42  35   -35  -19   -32           Right  Left                                             Right  Left                                             Right  Left                                            Right  Left                                            Right  Left                                        MCA = Middle Cerebral Artery      OPHT = Opthalmic Artery     BASILAR = Basilar Artery   ACA = Anterior Cerebral Artery     SIPH = Carotid Siphon PCA = Posterior Cerebral Artery   VERT = Verterbral Artery                   Normal MCA = 62+\-12 ACA = 50+\-12 PCA = 42+\-23   *Unable to insonate  Study was technically limited due to poor acoustic windows and poor patient cooperation secondary to pain.    02/08/2015 2:37 PM Gertie FeyMichelle Margrete Delude, RVT, RDCS, RDMS

## 2015-02-09 NOTE — Progress Notes (Signed)
Subjective: Patient reports feeling great  Objective: Vital signs in last 24 hours: Temp:  [97.8 F (36.6 C)-99.4 F (37.4 C)] 98.6 F (37 C) (02/23 0409) Pulse Rate:  [64-90] 78 (02/23 0400) Resp:  [14-26] 20 (02/23 0600) BP: (126-173)/(43-98) 148/70 mmHg (02/23 0600) SpO2:  [93 %-100 %] 98 % (02/23 0400)  Intake/Output from previous day: 02/22 0701 - 02/23 0700 In: 2730 [P.O.:530; I.V.:2200] Out: 200 [Urine:200] Intake/Output this shift:    Physical Exam: Awake, alert, conversant.  No drift.  Lab Results: No results for input(s): WBC, HGB, HCT, PLT in the last 72 hours. BMET No results for input(s): NA, K, CL, CO2, GLUCOSE, BUN, CREATININE, CALCIUM in the last 72 hours.  Studies/Results: No results found.  Assessment/Plan: Doing well.  Continue TCDs, monitor for spasm.    LOS: 5 days    Dorian HeckleSTERN,Delmo Matty D, MD 02/09/2015, 7:33 AM

## 2015-02-09 NOTE — Progress Notes (Signed)
Occupational Therapy Treatment Patient Details Name: VEORA FONTE MRN: [id number] DOB: 1956/01/29 Today's Date: 02/09/2015    History of present illness pt presents with [facility] post Crani with R MCA clipping.     OT comments  Pt making great progress with all adls. Pt at Mod I level only needing assist to manage lines at this point.  Will see for 1-2 more sessions to do some more high level adls and then may be able to d/c from acute OT.  Follow Up Recommendations  Home health OT    Equipment Recommendations  None recommended by OT    Recommendations for Other Services      Precautions / Restrictions Precautions Precautions: Fall Restrictions Weight Bearing Restrictions: No       Mobility Bed Mobility Overal bed mobility: Modified Independent Bed Mobility: Supine to Sit     Supine to sit: Modified independent (Device/Increase time)        Transfers Overall transfer level: Modified independent Equipment used: None Transfers: Sit to/from Stand Sit to Stand: Modified independent (Device/Increase time) Stand pivot transfers: Modified independent (Device/Increase time)       General transfer comment: assist needed today only to manage lines.  Otherwise pt was safe up on her feet.    Balance Overall balance assessment: No apparent balance deficits (not formally assessed)                                 ADL Overall ADL's : Needs assistance/impaired     Grooming: Wash/dry hands;Wash/dry face;Oral care;Modified independent Grooming Details (indicate cue type and reason): pt walked to sink and groomed without physical assist.  Therapist only needed to manage lines.             Lower Body Dressing: Modified independent;Sit to/from stand Lower Body Dressing Details (indicate cue type and reason): Pt walked in room to gather clothing.  Only assist  needed was to manage lines.             Functional mobility during ADLs:  Supervision/safety General ADL Comments: Pt doing very well with adls.  Only S or assist needed today was to disconnect her from lines etc to give her freedom to get up and move about.      Vision                     Perception     Praxis      Cognition   Behavior During Therapy: WFL for tasks assessed/performed Overall Cognitive Status: Within Functional Limits for tasks assessed                       Extremity/Trunk Assessment               Exercises     Shoulder Instructions       General Comments      Pertinent Vitals/ Pain       Pain Assessment: 0-10 Pain Score: 3  Pain Location: back pain Pain Descriptors / Indicators: Aching Pain Intervention(s): Repositioned;Patient requesting pain meds-RN notified;Limited activity within patient's tolerance  Home Living                                          Prior Functioning/Environment  Frequency Min 2X/week     Progress Toward Goals  OT Goals(current goals can now be found in the care plan section)  Progress towards OT goals: Progressing toward goals  Acute Rehab OT Goals Patient Stated Goal: home OT Goal Formulation: With patient Time For Goal Achievement: 02/15/15 Potential to Achieve Goals: Good ADL Goals Pt Will Perform Grooming: Independently;standing Pt Will Perform Upper Body Bathing: Independently;sitting Pt Will Perform Lower Body Bathing: Independently;sit to/from stand Pt Will Perform Upper Body Dressing: Independently;sitting Pt Will Perform Lower Body Dressing: Independently;sit to/from stand Pt Will Transfer to Toilet: Independently;ambulating Pt Will Perform Toileting - Clothing Manipulation and hygiene: Independently;sit to/from stand Pt Will Perform Tub/Shower Transfer: Tub transfer;with supervision;ambulating;rolling walker;tub bench  Plan Discharge plan remains appropriate    Co-evaluation                 End of Session      Activity Tolerance Patient tolerated treatment well   Patient Left in bed;with call bell/phone within reach   Nurse Communication Mobility status;Patient requests pain meds        Time: 1226-1250 OT Time Calculation (min): 24 min  Charges: OT General Charges $OT Visit: 1 Procedure OT Treatments $Self Care/Home Management : 23-37 mins  Hope BuddsJones, Kashus Karlen Anne 02/09/2015, 12:59 PM  [phone]

## 2015-02-10 MED ORDER — DICLOFENAC SODIUM 1 % TD GEL
2.0000 g | TRANSDERMAL | Status: DC | PRN
  Administered 2015-02-10: 2 g via TOPICAL
  Filled 2015-02-10: qty 100

## 2015-02-10 NOTE — Progress Notes (Signed)
Subjective: Patient reports "I'm ok...this sciatica is the worst. I've used that Voltaren gel in the past"  Objective: Vital signs in last 24 hours: Temp:  [98 F (36.7 C)-100.3 F (37.9 C)] 99.5 F (37.5 C) (02/24 0400) Pulse Rate:  [67-95] 75 (02/24 0700) Resp:  [14-20] 15 (02/24 0700) BP: (120-151)/(47-107) 149/67 mmHg (02/24 0700) SpO2:  [86 %-98 %] 94 % (02/24 0700)  Intake/Output from previous day: 02/23 0701 - 02/24 0700 In: 1980 [P.O.:480; I.V.:1500] Out: 1152 [Urine:1150; Stool:2] Intake/Output this shift:    Alert, conversant, PEARL. MAEW. No drift. Fluent speech. Incision without erythema, swelling, or drainage. Reports "dull ache" right frontal region, and constant pain right lumbar to right hip & thigh.  She attributes lumbar/hip pain to bedrest.   Lab Results: No results for input(s): WBC, HGB, HCT, PLT in the last 72 hours. BMET No results for input(s): NA, K, CL, CO2, GLUCOSE, BUN, CREATININE, CALCIUM in the last 72 hours.  Studies/Results: No results found.  Assessment/Plan: Improving   LOS: 6 days  Continue support. Will discuss pts voltaren gel request with DrStern.   Georgiann Cockeroteat, Dontario Evetts 02/10/2015, 8:01 AM

## 2015-02-10 NOTE — Progress Notes (Signed)
Physical Therapy Treatment Patient Details Name: Renee Murray MRN: [id number] DOB: 12/23/1955 Today's Date: 02/10/2015    History of Present Illness [facility] with R MCA clipping.      PT Comments    Pt indicates back and sciatic pain today limiting mobility.  Pt continues to move well despite the pain.  Will continue to follow.    Follow Up Recommendations  Home health PT;Supervision - Intermittent     Equipment Recommendations  None recommended by PT    Recommendations for Other Services       Precautions / Restrictions Precautions Precautions: Fall Restrictions Weight Bearing Restrictions: No    Mobility  Bed Mobility               General bed mobility comments: pt sitting in recliner.    Transfers Overall transfer level: Needs assistance Equipment used: None Transfers: Sit to/from Stand Sit to Stand: Supervision         General transfer comment: pt demos good technique, but relies heavily on UEs today 2/2 back pain and sciatica.    Ambulation/Gait Ambulation/Gait assistance: Supervision Ambulation Distance (Feet): 150 Feet Assistive device: None Gait Pattern/deviations: Step-through pattern;Decreased stride length     General Gait Details: pt a little hesitant today 2/2 back pain, but continues to be able to ambulate without physical A.     Stairs            Wheelchair Mobility    Modified Rankin (Stroke Patients Only)       Balance Overall balance assessment: Needs assistance Sitting-balance support: No upper extremity supported;Feet supported Sitting balance-Leahy Scale: Good     Standing balance support: No upper extremity supported Standing balance-Leahy Scale: Good                      Cognition Arousal/Alertness: Awake/alert Behavior During Therapy: WFL for tasks assessed/performed Overall Cognitive Status: Within Functional Limits for tasks assessed                       Exercises      General Comments        Pertinent Vitals/Pain Pain Assessment: 0-10 Pain Score: 5  Pain Location: Back Pain Descriptors / Indicators: Aching Pain Intervention(s): Monitored during session;Repositioned    Home Living                      Prior Function            PT Goals (current goals can now be found in the care plan section) Acute Rehab PT Goals Patient Stated Goal: home PT Goal Formulation: With patient Time For Goal Achievement: 02/13/15 Potential to Achieve Goals: Good Progress towards PT goals: Progressing toward goals    Frequency  Min 3X/week    PT Plan Current plan remains appropriate    Co-evaluation             End of Session Equipment Utilized During Treatment: Gait belt Activity Tolerance: Patient tolerated treatment well Patient left: in chair;with call bell/phone within reach     Time: 0822-0836 PT Time Calculation (min) (ACUTE ONLY): 14 min  Charges:  $Gait Training: 8-22 mins                    G CodesSunny Murray:      Renee Murray, [place]PT [phone] 02/10/2015, 12:55 PM

## 2015-02-10 NOTE — Progress Notes (Signed)
Transcranial Doppler  Date POD PCO2 HCT BP  MCA ACA PCA OPHT SIPH VERT Basilar  02/05/15 [facility]     Right  Left   79  81   -61  *   38  37   27  *   48  49   -25  -30   *      02/08/15 [facility]     Right  Left   *  84   *  *   *  *   21  18   42  35   -35  -19   -32      02-10-15 SB     Right  Left   88  83   -81  -52   19  --   12  23   49  40   -25  -44     -44          Right  Left                                             Right  Left                                            Right  Left                                            Right  Left                                        MCA = Middle Cerebral Artery      OPHT = Opthalmic Artery     BASILAR = Basilar Artery   ACA = Anterior Cerebral Artery     SIPH = Carotid Siphon PCA = Posterior Cerebral Artery   VERT = Verterbral Artery                   Normal MCA = 62+\-12 ACA = 50+\-12 PCA = 42+\-23   *Unable to insonate  Study was technically limited due to poor acoustic windows and poor patient cooperation secondary to pain.    02/10/2015 12:43 PM Gertie FeyMichelle Simonetti, RVT, RDCS, RDMS

## 2015-02-11 NOTE — Progress Notes (Signed)
Subjective: Patient reports "I feel fine. This hip still wants to hurt"  Objective: Vital signs in last 24 hours: Temp:  [98 F (36.7 C)-99.3 F (37.4 C)] 98.8 F (37.1 C) (02/25 0400) Pulse Rate:  [64-85] 76 (02/25 0707) Resp:  [12-23] 12 (02/25 0707) BP: (108-159)/(47-100) 123/100 mmHg (02/25 0707) SpO2:  [91 %-99 %] 98 % (02/25 0707)  Intake/Output from previous day: 02/24 0701 - 02/25 0700 In: 2400 [I.V.:2400] Out: -  Intake/Output this shift:    Alert, conversant. PEARL  MAEW  No drift.  Up to sink for hygiene without issues. Incision with staples, no erythema, swelling, or drainage. Normotensive during room visit 137/63.  Lab Results: No results for input(s): WBC, HGB, HCT, PLT in the last 72 hours. BMET No results for input(s): NA, K, CL, CO2, GLUCOSE, BUN, CREATININE, CALCIUM in the last 72 hours.  Studies/Results: No results found.  Assessment/Plan: Improving   LOS: 7 days  Per Dr. Venetia MaxonStern, ok to transfer to floor.    Georgiann Cockeroteat, Maghan Jessee 02/11/2015, 7:39 AM

## 2015-02-12 ENCOUNTER — Inpatient Hospital Stay (HOSPITAL_COMMUNITY): Payer: Managed Care, Other (non HMO)

## 2015-02-12 MED ORDER — SODIUM CHLORIDE 0.9 % IV BOLUS (SEPSIS)
500.0000 mL | Freq: Once | INTRAVENOUS | Status: AC
  Administered 2015-02-12: 500 mL via INTRAVENOUS

## 2015-02-12 MED ORDER — PHENYLEPHRINE HCL 10 MG/ML IJ SOLN
0.0000 ug/min | INTRAVENOUS | Status: DC
  Administered 2015-02-13: 55 ug/min via INTRAVENOUS
  Administered 2015-02-13: 20 ug/min via INTRAVENOUS
  Filled 2015-02-12 (×3): qty 1

## 2015-02-12 MED ORDER — SODIUM CHLORIDE 0.9 % IV SOLN
INTRAVENOUS | Status: DC
  Administered 2015-02-12 – 2015-02-13 (×2): via INTRAVENOUS

## 2015-02-12 MED ORDER — SODIUM CHLORIDE 0.9 % IV SOLN
INTRAVENOUS | Status: DC
  Administered 2015-02-12: 19:00:00 via INTRAVENOUS

## 2015-02-12 MED ORDER — SODIUM CHLORIDE 0.9 % IV SOLN: INTRAVENOUS | Status: DC | PRN

## 2015-02-12 MED ORDER — LEVETIRACETAM IN NACL 500 MG/100ML IV SOLN
500.0000 mg | Freq: Two times a day (BID) | INTRAVENOUS | Status: DC
  Administered 2015-02-12 – 2015-02-14 (×4): 500 mg via INTRAVENOUS
  Filled 2015-02-12 (×5): qty 100

## 2015-02-12 NOTE — Progress Notes (Addendum)
Occupational Therapy Treatment Patient Details Name: Renee Murray MRN: [id number] DOB: 1956-08-03 Today's Date: 02/12/2015    History of present illness pt presents with [facility] post Crani with R MCA clipping.     OT comments  Pt making excellent progress. Feel pt is ready to D/C home when medically stable. Recommend follow up with HHOT to work on higher level IADL tasks and maximize safety and facilitate return to living alone. All further OT to be addressed by HHOT.  Follow Up Recommendations  Home health OT    Equipment Recommendations  None recommended by OT    Recommendations for Other Services      Precautions / Restrictions Precautions Precautions: Fall       Mobility Bed Mobility Overal bed mobility: Modified Independent                Transfers Overall transfer level: Modified independent                    Balance Overall balance assessment: Needs assistance           Standing balance-Leahy Scale: Good Standing balance comment: no LOB noted                   ADL       Grooming: Modified independent;Standing   Upper Body Bathing: Modified independent Upper Body Bathing Details (indicate cue type and reason): pt/nurse reports pt did her own bath this am Lower Body Bathing: Modified independent;Sit to/from stand   Upper Body Dressing : Set up (due to lines)   Lower Body Dressing: Modified independent;Sit to/from stand   Toilet Transfer: Supervision/safety;Ambulation   Toileting- Clothing Manipulation and Hygiene: Supervision/safety       Functional mobility during ADLs: Supervision/safety General ADL Comments: Pt discussing her need to refrain from driving and that her children should take her keys so she wouldn't be "tempted". Discussed recommendation for pt to initially have close to 24/7 S. Family staes she can stay with them as long as needed.                                      Cognition   Behavior  During Therapy: WFL for tasks assessed/performed Overall Cognitive Status: Within Functional Limits for tasks assessed                                                 General Comments  Very pleasant and appropriate. Very appreciative.    Pertinent Vitals/ Pain       Pain Assessment: 0-10 Pain Score: 2  Pain Location: r side of head Pain Descriptors / Indicators: Aching Pain Intervention(s): Limited activity within patient's tolerance  Home Living                                          Prior Functioning/Environment              Frequency Min 2X/week     Progress Toward Goals  OT Goals(current goals can now be found in the care plan section)  Progress towards OT goals: Progressing toward goals  Acute Rehab OT Goals Patient Stated Goal: home OT  Goal Formulation: With patient Time For Goal Achievement: 02/15/15 Potential to Achieve Goals: Good ADL Goals Pt Will Perform Grooming: Independently;standing Pt Will Perform Upper Body Bathing: Independently;sitting Pt Will Perform Lower Body Bathing: Independently;sit to/from stand Pt Will Perform Upper Body Dressing: Independently;sitting Pt Will Perform Lower Body Dressing: Independently;sit to/from stand Pt Will Transfer to Toilet: Independently;ambulating Pt Will Perform Toileting - Clothing Manipulation and hygiene: Independently;sit to/from stand Pt Will Perform Tub/Shower Transfer: Tub transfer;with supervision;ambulating;rolling walker;tub bench  Plan Discharge plan remains appropriate    Co-evaluation                 End of Session Equipment Utilized During Treatment: Gait belt   Activity Tolerance Patient tolerated treatment well   Patient Left in chair;with call bell/phone within reach;with family/visitor present   Nurse Communication Mobility status        Time: 1710-1741 OT Time Calculation (min): 31 min  Charges: OT General Charges $OT Visit: 1  Procedure OT Treatments $Self Care/Home Management : 23-37 mins  Labrina Lines,HILLARY 02/12/2015, 5:49 PM   [facility] Georgiailary Anthon Harpole, OTR/L  [phone] 02/12/2015

## 2015-02-12 NOTE — Progress Notes (Signed)
Pt seen and examined. No issues overnight. Pt with some HA, primary c/o right back and leg pain. No N/T/W.  EXAM: Temp:  [98.4 F (36.9 C)-98.9 F (37.2 C)] 98.8 F (37.1 C) (02/26 0400) Pulse Rate:  [65-98] 65 (02/26 0700) Resp:  [11-23] 13 (02/26 0700) BP: (92-153)/(44-102) 134/72 mmHg (02/26 0700) SpO2:  [95 %-100 %] 96 % (02/26 0700) Intake/Output      02/25 0701 - 02/26 0700 02/26 0701 - 02/27 0700   P.O. 180    I.V. (mL/kg) 2400 (36.3)    Total Intake(mL/kg) 2580 (39)    Net +2580          Urine Occurrence 7 x    Stool Occurrence 1 x     Awake, alert, oriented Speech fluent CN intact Good strength throughout Wound c/d/i  LABS: Lab Results  Component Value Date   CREATININE 0.56 02/04/2015   BUN 12 02/04/2015   NA 141 02/04/2015   K 3.5 02/04/2015   CL 107 02/04/2015   CO2 27 02/04/2015   Lab Results  Component Value Date   WBC 6.7 02/04/2015   HGB 11.2* 02/04/2015   HCT 33.0* 02/04/2015   MCV 90.7 02/04/2015   PLT 169 02/04/2015    IMPRESSION: - 59 y.o. female SAH d# 10 s/p RMCA clipping, neurologically well  PLAN: - Transfer to floor - Cont to mobilize - Stop TCD monitoring - Cont Nimotop for total of 21 days.

## 2015-02-12 NOTE — Progress Notes (Signed)
Subjective: Patient reports sudden left sided weakness and facial droop.  Objective: Vital signs in last 24 hours: Temp:  [97.9 F (36.6 C)-98.9 F (37.2 C)] 98.4 F (36.9 C) (02/26 1745) Pulse Rate:  [64-98] 86 (02/26 1745) Resp:  [11-23] 18 (02/26 1745) BP: (92-153)/(44-102) 135/58 mmHg (02/26 1745) SpO2:  [95 %-100 %] 99 % (02/26 1745)  Intake/Output from previous day: 02/25 0701 - 02/26 0700 In: 2580 [P.O.:180; I.V.:2400] Out: -  Intake/Output this shift:    Physical Exam: Left hemiplegia with facial droop.  Patient is awake, alert, states name and is oriented.  EOMI.  No neglect.  Lab Results: No results for input(s): WBC, HGB, HCT, PLT in the last 72 hours. BMET No results for input(s): NA, K, CL, CO2, GLUCOSE, BUN, CREATININE, CALCIUM in the last 72 hours.  Studies/Results: No results found.  Assessment/Plan: Head CT shows some peri-hemorrhagic edema, [facility] with some persistent SAH without evidence of new stroke.  I will assume vasospasm and will give IV fluids and get SBP to 180-200 range.  If not resolved, may require CTA, perfusion study and possible angioplasty.  She is not a candidate for anticoagulation.  I discussed situation with patient, her family, and Dr. Conchita ParisNundkumar.    LOS: 8 days    Dorian HeckleSTERN,Izac Faulkenberry D, MD 02/12/2015, 7:02 PM

## 2015-02-12 NOTE — Progress Notes (Signed)
Physical Therapy Treatment Patient Details Name: Renee ButteCynthia Y Murray MRN: [id number] DOB: 06/27/1956 Today's Date: 02/12/2015    [facility] of Present Illness [facility].      PT Comments    Pt moving well today and feel ready to D/C from PT standpoint.  Will check back on pt if she remains on acute.    Follow Up Recommendations  Home health PT;Supervision - Intermittent     Equipment Recommendations  None recommended by PT    Recommendations for Other Services       Precautions / Restrictions Restrictions Weight Bearing Restrictions: No    Mobility  Bed Mobility Overal bed mobility: Modified Independent                Transfers Overall transfer level: Needs assistance                  Ambulation/Gait Ambulation/Gait assistance: Supervision Ambulation Distance (Feet): 300 Feet Assistive device: None Gait Pattern/deviations: Step-through pattern;Decreased stride length     General Gait Details: pt moving well today and able to increase ambulation distance.  Encouraged increased ambulation with nsg staff throughout the day.     Stairs            Wheelchair Mobility    Modified Rankin (Stroke Patients Only)       Balance Overall balance assessment: Needs assistance Sitting-balance support: No upper extremity supported;Feet supported Sitting balance-Leahy Scale: Good     Standing balance support: No upper extremity supported Standing balance-Leahy Scale: Good                      Cognition Arousal/Alertness: Awake/alert Behavior During Therapy: WFL for tasks assessed/performed Overall Cognitive Status: Within Functional Limits for tasks assessed                      Exercises      General Comments        Pertinent Vitals/Pain Pain Assessment: 0-10 Pain Score: 3  Pain Location: back and hips Pain Descriptors / Indicators: Aching Pain Intervention(s): Monitored during  session;Repositioned;Premedicated before session    Home Living                      Prior Function            PT Goals (current goals can now be found in the care plan section) Acute Rehab PT Goals Patient Stated Goal: home PT Goal Formulation: With patient Time For Goal Achievement: 02/13/15 Potential to Achieve Goals: Good Progress towards PT goals: Progressing toward goals    Frequency  Min 3X/week    PT Plan Current plan remains appropriate    Co-evaluation             End of Session   Activity Tolerance: Patient tolerated treatment well Patient left: in chair;with call bell/phone within reach     Time: [id number] PT Time Calculation (min) (ACUTE ONLY): 23 min  Charges:  $Gait Training: 23-37 mins                    G CodesSunny Schlein:      Terral Cooks F, [place]PT [phone] 02/12/2015, 11:03 AM

## 2015-02-12 NOTE — Progress Notes (Signed)
Attempted to place A-Line, pt tense, moving and yelling for me to stop. Pt refusing A-Line to be attempted again at this time. RN aware.

## 2015-02-12 NOTE — Significant Event (Signed)
Rapid Response Event Note  Overview: Time Called: 1812 Arrival Time: 1815 Event Type: Neurologic  Initial Focused Assessment:  Called by Rn to evaluate patient with facial droop, slurred speech and left side weakness.     Interventions:  Dr Venetia MaxonStern called and notified, Patient placed on monitor, transported to CT scan via bed and then to 72M 14.  Primary RN gave report to ICU RN.  Dr. Venetia MaxonStern at bedside.    Event Summary:   at      at          [facility], Renee Murray Renee

## 2015-02-12 NOTE — Progress Notes (Signed)
Pt arrived to 4N24 at current time.  Pt A&O x 4, c/o 6/10 R head surgical pain, site open to air with staples, no drains, incision clean dry and intact, minimal swelling   Pt V/S taken,  Pt ambulated into the room without assistance. Pt without distress.  Diet ordered, will monitor.

## 2015-02-12 NOTE — Progress Notes (Signed)
Subjective: Patient reports OK  Objective: Vital signs in last 24 hours: Temp:  [97.9 F (36.6 C)-98.8 F (37.1 C)] 98.4 F (36.9 C) (02/26 1745) Pulse Rate:  [64-98] 67 (02/26 1900) Resp:  [11-23] 19 (02/26 1900) BP: (92-156)/(52-102) 156/69 mmHg (02/26 1900) SpO2:  [95 %-100 %] 97 % (02/26 1900)  Intake/Output from previous day: 02/25 0701 - 02/26 0700 In: 2580 [P.O.:180; I.V.:2400] Out: -  Intake/Output this shift:    Physical Exam: Remarkable improvement in examination after fluid bolus with BP in the 150s.  Now only minimal weakness in left arm and leg and facial droop improved.    Lab Results: No results for input(s): WBC, HGB, HCT, PLT in the last 72 hours. BMET No results for input(s): NA, K, CL, CO2, GLUCOSE, BUN, CREATININE, CALCIUM in the last 72 hours.  Studies/Results: Ct Head Wo Contrast  02/12/2015   CLINICAL DATA:  Acute left upper extremity weakness.  Facial droop.  EXAM: CT HEAD WITHOUT CONTRAST  TECHNIQUE: Contiguous axial images were obtained from the base of the skull through the vertex without intravenous contrast.  COMPARISON:  CT scan of February 04, 2015.  FINDINGS: Status post right frontal craniotomy for clipping of right MCA aneurysm. No midline shift is noted. Ventricular size is within normal limits. Mild chronic ischemic white matter disease is noted. There remains mild subarachnoid hemorrhage in the superior portion of right parietal cortex which was present on prior exam, but appears to be decreased currently. Area main subarachnoid hemorrhage in the right sylvian fissure which is slightly improved compared to prior exam. Surrounding low density is noted in the white matter which may represent edema or infarction. New low density is also noted in the medial portion of the right temporal lobe inferiorly which may represent postoperative edema or infarction. Minimal right subdural hematoma is noted most likely postoperative.  IMPRESSION: Postsurgical  changes are noted in right frontal skull consistent with surgical aneurysm clipping of right MCA aneurysm.  Small amount of subarachnoid hemorrhage is seen superiorly in right parietal cortex which is improved compared to prior exam. Also noted is residual subarachnoid hemorrhage in the right sylvian fissure.  New low density is noted in the inferior portion of the right temporal lobe as well as in the right basal ganglia and periventricular white matter ; this may represent postoperative edema, but acute to subacute infarction cannot be excluded Critical Value/emergent results were called by telephone at the time of interpretation on 02/12/2015 at 7:00 pm to Dr. Maeola HarmanJOSEPH Amalya Salmons , who verbally acknowledged these results.   Electronically Signed   By: Lupita RaiderJames  Green Jr, M.D.   On: 02/12/2015 19:01    Assessment/Plan: Patient remarkably improved after fluid bolus.  Perhaps this was a seizure, although no abnormal movements were witnessed other than some eye twitching by family members.  Patient did not tolerate placement of A line, so we will try to manage BP with cuff pressures.  I have started her on Keppra due to suspicion of seizure.  Continue to monitor closely in ICU.  No angio at present.     LOS: 8 days    Dorian HeckleSTERN,Derionna Salvador D, MD 02/12/2015, 8:21 PM

## 2015-02-12 NOTE — Progress Notes (Signed)
Pt family came out to notify RN that pt was drooling and dropped her phone while using it in her left hand.  Upon assessment, pt had new onset slurred speecha nd L facial droop, was drooling and had new onset L grip weakness.  Rapid RN and neurosurgery was called.  Stat head CT and ICU transfer was ordered.  While transferring to radiology, pt exhibited LLE weakness as well.  Pt in CT currently.

## 2015-02-13 LAB — BASIC METABOLIC PANEL
Anion gap: 6 (ref 5–15)
BUN: 5 mg/dL — ABNORMAL LOW (ref 6–23)
CHLORIDE: 108 mmol/L (ref 96–112)
CO2: 26 mmol/L (ref 19–32)
Calcium: 8.8 mg/dL (ref 8.4–10.5)
Creatinine, Ser: 0.4 mg/dL — ABNORMAL LOW (ref 0.50–1.10)
GFR calc Af Amer: >90 mL/min (ref >90)
Glucose, Bld: 91 mg/dL (ref 70–99)
Potassium: 3.4 mmol/L — ABNORMAL LOW (ref 3.5–5.1)
SODIUM: 140 mmol/L (ref 135–145)

## 2015-02-13 MED ORDER — MUSCLE RUB 10-15 % EX CREA
TOPICAL_CREAM | CUTANEOUS | Status: DC | PRN
  Administered 2015-02-13: 1 via TOPICAL
  Filled 2015-02-13: qty 85

## 2015-02-13 MED ORDER — TROLAMINE SALICYLATE 10 % EX CREA
TOPICAL_CREAM | CUTANEOUS | Status: DC | PRN
  Filled 2015-02-13: qty 85

## 2015-02-13 NOTE — Progress Notes (Signed)
Subjective: Patient reports feeling better  Objective: Vital signs in last 24 hours: Temp:  [97.9 F (36.6 C)-99.4 F (37.4 C)] 98 F (36.7 C) (02/27 0359) Pulse Rate:  [61-86] 62 (02/27 0700) Resp:  [12-26] 16 (02/27 0700) BP: (69-169)/(43-149) 150/53 mmHg (02/27 0700) SpO2:  [93 %-100 %] 100 % (02/27 0700)  Intake/Output from previous day: 02/26 0701 - 02/27 0700 In: 2790 [P.O.:960; I.V.:1730; IV Piggyback:100] Out: 2775 [Urine:2775] Intake/Output this shift:    Physical Exam: Complete resolution of left hemiparesis.  Up and walking.  .  Lab Results: No results for input(s): WBC, HGB, HCT, PLT in the last 72 hours. BMET No results for input(s): NA, K, CL, CO2, GLUCOSE, BUN, CREATININE, CALCIUM in the last 72 hours.  Studies/Results: Ct Head Wo Contrast  02/12/2015   CLINICAL DATA:  Acute left upper extremity weakness.  Facial droop.  EXAM: CT HEAD WITHOUT CONTRAST  TECHNIQUE: Contiguous axial images were obtained from the base of the skull through the vertex without intravenous contrast.  COMPARISON:  CT scan of February 04, 2015.  FINDINGS: Status post right frontal craniotomy for clipping of right MCA aneurysm. No midline shift is noted. Ventricular size is within normal limits. Mild chronic ischemic white matter disease is noted. There remains mild subarachnoid hemorrhage in the superior portion of right parietal cortex which was present on prior exam, but appears to be decreased currently. Area main subarachnoid hemorrhage in the right sylvian fissure which is slightly improved compared to prior exam. Surrounding low density is noted in the white matter which may represent edema or infarction. New low density is also noted in the medial portion of the right temporal lobe inferiorly which may represent postoperative edema or infarction. Minimal right subdural hematoma is noted most likely postoperative.  IMPRESSION: Postsurgical changes are noted in right frontal skull consistent  with surgical aneurysm clipping of right MCA aneurysm.  Small amount of subarachnoid hemorrhage is seen superiorly in right parietal cortex which is improved compared to prior exam. Also noted is residual subarachnoid hemorrhage in the right sylvian fissure.  New low density is noted in the inferior portion of the right temporal lobe as well as in the right basal ganglia and periventricular white matter ; this may represent postoperative edema, but acute to subacute infarction cannot be excluded Critical Value/emergent results were called by telephone at the time of interpretation on 02/12/2015 at 7:00 pm to Dr. Maeola HarmanJOSEPH Winda Summerall , who verbally acknowledged these results.   Electronically Signed   By: Lupita RaiderJames  Green Jr, M.D.   On: 02/12/2015 19:01    Assessment/Plan: Will discontinue neosynephrine, continue HHH today.  Observe in ICU.  Possibly this was from a seizure.    LOS: 9 days    Dorian HeckleSTERN,Arna Luis D, MD 02/13/2015, 8:33 AM

## 2015-02-14 MED ORDER — LEVETIRACETAM 500 MG PO TABS
500.0000 mg | ORAL_TABLET | Freq: Two times a day (BID) | ORAL | Status: DC
  Administered 2015-02-14 – 2015-02-17 (×6): 500 mg via ORAL
  Filled 2015-02-14 (×7): qty 1

## 2015-02-14 MED ORDER — POTASSIUM CHLORIDE IN NACL 20-0.9 MEQ/L-% IV SOLN
INTRAVENOUS | Status: DC
  Administered 2015-02-14 – 2015-02-17 (×5): via INTRAVENOUS
  Filled 2015-02-14 (×12): qty 1000

## 2015-02-14 NOTE — Progress Notes (Signed)
Key Points: Use following P&T approved IV to PO antibiotic change policy.  Description contains the criteria that are approved Note: Policy Excludes:  Esophagectomy patientsPHARMACIST - PHYSICIAN COMMUNICATION DR:   Conchita ParisNundkumar and colleagues CONCERNING: IV to Oral Route Change Policy  RECOMMENDATION: This patient is receiving Keppra by the intravenous route.  Based on criteria approved by the Pharmacy and Therapeutics Committee, the intravenous medication(s) is/are being converted to the equivalent oral dose form(s).   DESCRIPTION: These criteria include:  The patient is eating (either orally or via tube) and/or has been taking other orally administered medications for a least 24 hours  The patient has no evidence of active gastrointestinal bleeding or impaired GI absorption (gastrectomy, short bowel, patient on TNA or NPO).  If you have questions about this conversion, please contact the Pharmacy Department  []   [phone]( [phone] )  [facility] [x]   [phone]( [phone] )  [facility]  []   [phone]( [phone] )  [facility] []   [phone]( [phone] )  [facility]   Christoper Fabianaron Cydni Reddoch, PharmD, BCPS Clinical pharmacist, pager [phone] 02/14/2015 11:40 AM

## 2015-02-14 NOTE — Progress Notes (Signed)
Subjective: Patient reports doing well  Objective: Vital signs in last 24 hours: Temp:  [98.3 F (36.8 C)-98.9 F (37.2 C)] 98.7 F (37.1 C) (02/28 0400) Pulse Rate:  [55-79] 73 (02/28 0600) Resp:  [13-21] 15 (02/28 0700) BP: (110-151)/(48-69) 136/65 mmHg (02/28 0700) SpO2:  [92 %-100 %] 100 % (02/28 0600)  Intake/Output from previous day: 02/27 0701 - 02/28 0700 In: 4179.5 [P.O.:240; I.V.:3739.5; IV Piggyback:200] Out: 3 [Urine:3] Intake/Output this shift:    Physical Exam: Awake, alert, conversant.  MAEW without weakness  Lab Results: No results for input(s): WBC, HGB, HCT, PLT in the last 72 hours. BMET  Recent Labs  02/13/15 1100  NA 140  K 3.4*  CL 108  CO2 26  GLUCOSE 91  BUN 5*  CREATININE 0.40*  CALCIUM 8.8    Studies/Results: Ct Head Wo Contrast  02/12/2015   CLINICAL DATA:  Acute left upper extremity weakness.  Facial droop.  EXAM: CT HEAD WITHOUT CONTRAST  TECHNIQUE: Contiguous axial images were obtained from the base of the skull through the vertex without intravenous contrast.  COMPARISON:  CT scan of February 04, 2015.  FINDINGS: Status post right frontal craniotomy for clipping of right MCA aneurysm. No midline shift is noted. Ventricular size is within normal limits. Mild chronic ischemic white matter disease is noted. There remains mild subarachnoid hemorrhage in the superior portion of right parietal cortex which was present on prior exam, but appears to be decreased currently. Area main subarachnoid hemorrhage in the right sylvian fissure which is slightly improved compared to prior exam. Surrounding low density is noted in the white matter which may represent edema or infarction. New low density is also noted in the medial portion of the right temporal lobe inferiorly which may represent postoperative edema or infarction. Minimal right subdural hematoma is noted most likely postoperative.  IMPRESSION: Postsurgical changes are noted in right frontal skull  consistent with surgical aneurysm clipping of right MCA aneurysm.  Small amount of subarachnoid hemorrhage is seen superiorly in right parietal cortex which is improved compared to prior exam. Also noted is residual subarachnoid hemorrhage in the right sylvian fissure.  New low density is noted in the inferior portion of the right temporal lobe as well as in the right basal ganglia and periventricular white matter ; this may represent postoperative edema, but acute to subacute infarction cannot be excluded Critical Value/emergent results were called by telephone at the time of interpretation on 02/12/2015 at 7:00 pm to Dr. Maeola HarmanJOSEPH Ritchie Klee , who verbally acknowledged these results.   Electronically Signed   By: Lupita RaiderJames  Green Jr, M.D.   On: 02/12/2015 19:01    Assessment/Plan: Doing well.  Mobilize.  Observe in ICU today.    LOS: 10 days    Dorian HeckleSTERN,Shima Compere D, MD 02/14/2015, 8:08 AM

## 2015-02-15 NOTE — Progress Notes (Signed)
Pt seen and examined. No issues overnight. Pt feels well, no HA, no weakness.  EXAM: Temp:  [97.9 F (36.6 C)-99.3 F (37.4 C)] 98.7 F (37.1 C) (02/29 0800) Pulse Rate:  [66-73] 67 (02/29 0800) Resp:  [11-29] 14 (02/29 0800) BP: (109-146)/(42-92) 136/64 mmHg (02/29 0800) SpO2:  [92 %-100 %] 94 % (02/29 0800) Intake/Output      02/28 0701 - 02/29 0700 02/29 0701 - 03/01 0700   P.O.     I.V. (mL/kg) 3074.2 (46.5) 125 (1.9)   IV Piggyback 100    Total Intake(mL/kg) 3174.2 (48) 125 (1.9)   Urine (mL/kg/hr) 4 (0)    Total Output 4     Net +3170.2 +125        Urine Occurrence 4 x     Awake, alert, oriented Speech fluent CN intact Good strength throughout, no drift  LABS: Lab Results  Component Value Date   CREATININE 0.40* 02/13/2015   BUN 5* 02/13/2015   NA 140 02/13/2015   K 3.4* 02/13/2015   CL 108 02/13/2015   CO2 26 02/13/2015   Lab Results  Component Value Date   WBC 6.7 02/04/2015   HGB 11.2* 02/04/2015   HCT 33.0* 02/04/2015   MCV 90.7 02/04/2015   PLT 169 02/04/2015    IMPRESSION: - 59 y.o. female SAH d# 13 s/p RMCA clipping, has returned to neurologic baseline. ? SZ vs vasospasm  PLAN: - Cont observation in ICU. May transfer to floor this afternoon or tomorrow - Cont IVF

## 2015-02-15 NOTE — Progress Notes (Signed)
Physical Therapy Treatment Patient Details Name: Renee ButteCynthia Y Murray MRN: [id number] DOB: 12/22/1955 Today's Date: 02/15/2015    [facility] of Present Illness [facility] R MCA [facility].  pt with possible Seizure activity and brought back to ICU for observation.      PT Comments    Pt moving well, just slightly slower than when this PT previously saw pt last week.  Continue to feel pt is safe for D/C to home.  Will continue to follow.    Follow Up Recommendations  Home health PT;Supervision - Intermittent     Equipment Recommendations  None recommended by PT    Recommendations for Other Services       Precautions / Restrictions Precautions Precautions: Fall Restrictions Weight Bearing Restrictions: No    Mobility  Bed Mobility Overal bed mobility: Modified Independent                Transfers Overall transfer level: Modified independent                  Ambulation/Gait Ambulation/Gait assistance: Supervision Ambulation Distance (Feet): 150 Feet Assistive device: None Gait Pattern/deviations: Step-through pattern;Decreased stride length     General Gait Details: pt moves slowly and indicates R Hip/sciatica limiting ambulation today.     Stairs            Wheelchair Mobility    Modified Rankin (Stroke Patients Only)       Balance Overall balance assessment: Needs assistance Sitting-balance support: No upper extremity supported;Feet supported Sitting balance-Leahy Scale: Good     Standing balance support: No upper extremity supported;During functional activity Standing balance-Leahy Scale: Fair                      Cognition Arousal/Alertness: Awake/alert Behavior During Therapy: WFL for tasks assessed/performed Overall Cognitive Status: Within Functional Limits for tasks assessed                      Exercises      General Comments        Pertinent Vitals/Pain Pain Assessment: 0-10 Pain  Score: 3  Pain Location: R hip, sciatica. Pain Descriptors / Indicators: Aching Pain Intervention(s): Monitored during session;Premedicated before session;Repositioned    Home Living                      Prior Function            PT Goals (current goals can now be found in the care plan section) Acute Rehab PT Goals Patient Stated Goal: home PT Goal Formulation: With patient Time For Goal Achievement: 02/27/15 Potential to Achieve Goals: Good Progress towards PT goals: Progressing toward goals    Frequency  Min 3X/week    PT Plan Current plan remains appropriate    Co-evaluation             End of Session   Activity Tolerance: Patient tolerated treatment well Patient left: in bed;with call bell/phone within reach;with family/visitor present     Time: 1340-1359 PT Time Calculation (min) (ACUTE ONLY): 19 min  Charges:  $Gait Training: 8-22 mins                    G CodesSunny Schlein:      Braeden Kennan F, [place]PT [phone] 02/15/2015, 2:07 PM

## 2015-02-16 NOTE — Progress Notes (Signed)
Physical Therapy Treatment Patient Details Name: Renee ButteCynthia Y Eyster MRN: [id number] DOB: 06/24/1956 Today's Date: 02/16/2015    [facility] of Present Illness [facility].  pt with possible Seizure activity and brought back to ICU for observation.      PT Comments    Patient very sweet and progressing well. Eager to return home. Discussed safety at home. Patient has family that will be helping her out  Follow Up Recommendations  Home health PT;Supervision - Intermittent     Equipment Recommendations  None recommended by PT    Recommendations for Other Services       Precautions / Restrictions Precautions Precautions: Fall    Mobility  Bed Mobility Overal bed mobility: Modified Independent                Transfers Overall transfer level: Modified independent                  Ambulation/Gait Ambulation/Gait assistance: Supervision Ambulation Distance (Feet): 300 Feet Assistive device: None Gait Pattern/deviations: Step-through pattern;Decreased stride length     General Gait Details: pt moves slowly and indicates R Hip/sciatica limiting ambulation speed today but progressed as she walked   Stairs Stairs: Yes Stairs assistance: Supervision Stair Management: One rail Left Number of Stairs: 10 General stair comments: pt demos good safety on stairs.    Wheelchair Mobility    Modified Rankin (Stroke Patients Only)       Balance                                    Cognition Arousal/Alertness: Awake/alert Behavior During Therapy: WFL for tasks assessed/performed Overall Cognitive Status: Within Functional Limits for tasks assessed                      Exercises      General Comments        Pertinent Vitals/Pain Pain Score: 3  Pain Location: R hip Pain Descriptors / Indicators: Sore Pain Intervention(s): Monitored during session;Repositioned    Home Living                       Prior Function            PT Goals (current goals can now be found in the care plan section) Progress towards PT goals: Progressing toward goals    Frequency  Min 3X/week    PT Plan Current plan remains appropriate    Co-evaluation             End of Session Equipment Utilized During Treatment: Gait belt Activity Tolerance: Patient tolerated treatment well Patient left: in bed;with call bell/phone within reach     Time: 1436-1500 PT Time Calculation (min) (ACUTE ONLY): 24 min  Charges:  $Gait Training: 8-22 mins $Therapeutic Activity: 8-22 mins                    G Codes:      Fredrich BirksRobinette, Julia Elizabeth 02/16/2015, 3:29 PM 02/16/2015 Fredrich Birksobinette, Julia Elizabeth PTA [phone] pager [phone] office

## 2015-02-16 NOTE — Progress Notes (Signed)
UR complete.  Harutyun Monteverde RN, MSN 

## 2015-02-16 NOTE — Progress Notes (Signed)
Did not get information in report that pt was Q1hr for V/S; I received that patient was q4hrs.  I noticed it when I had time to sit down and look at the patients notes and orders.  I will start recording all vital signs Q1hr until discontinued.

## 2015-02-17 MED ORDER — LEVETIRACETAM 500 MG PO TABS: 500.0000 mg | ORAL_TABLET | Freq: Two times a day (BID) | ORAL | Status: DC

## 2015-02-17 MED ORDER — HYDROCODONE-ACETAMINOPHEN 5-325 MG PO TABS: 1.0000 | ORAL_TABLET | ORAL | Status: DC | PRN

## 2015-02-17 MED ORDER — NIMODIPINE 30 MG PO CAPS: 60.0000 mg | ORAL_CAPSULE | ORAL | Status: DC

## 2015-02-17 MED ORDER — AMLODIPINE BESYLATE 5 MG PO TABS: 5.0000 mg | ORAL_TABLET | Freq: Every day | ORAL | Status: DC

## 2015-02-17 NOTE — Progress Notes (Signed)
D/C orders received. Pt and sister educated on d/c instructions and stroke education. Pt verbalized understanding. Pt handed d/c packet and prescriptions. IV and staples removed by RN. Pt taken downstairs by staff via wheelchair.

## 2015-02-17 NOTE — Care Management Note (Signed)
    Page 1 of 1   02/17/2015     10:14:34 AM CARE MANAGEMENT NOTE 02/17/2015  Patient:  Renee Murray, Renee Murray   Account Number:  [id number]  Date Initiated:  02/08/2015  Documentation initiated by:  Sandi Mariscal  Subjective/Objective Assessment:   ruptured R MCA aneurysm; clipping done     Action/Plan:   in ICU until 2/29 for Nimotop q4h, TCDs QOD, IVF 100cc/hr.  Therapy recommending HH at d/c.   Anticipated DC Date:  02/16/2015   Anticipated DC Plan:  [place]         Choice offered to / List presented to:  C-1 Patient        [place] arranged  [place] [facility]      [place].   Status of service:  In process, will continue to follow Medicare Important Message given?   (If response is "NO", the following Medicare IM given date fields will be blank) Date Medicare IM given:   Medicare IM given by:   Date Additional Medicare IM given:   Additional Medicare IM given by:    Discharge Disposition:  [place]  Per UR Regulation:  Reviewed for med. necessity/level of care/duration of stay  If discussed at [place] of Stay Meetings, dates discussed:   02/09/2015  02/11/2015  02/11/2015    Comments:  02/17/15 0945 Lorne Skeens RN, MSN, CM- Met with patient to discuss home health needs. Patient will discharging home on [facility], [facility]  [facility]. Miranda with [facility] was notified and has accepted the referral for discharge home today.  Demographics were verified and are correct in the chart.

## 2015-02-17 NOTE — Discharge Summary (Signed)
Physician Discharge Summary  Patient ID: Renee Murray MRN: [id number] DOB/AGE: 06-08-1956 59 y.o.  Admit date: 02/04/2015 Discharge date: 02/17/2015  Admission Diagnoses: subarachnoid hemorrhage  Discharge Diagnoses: Same Active Problems:   Subarachnoid hemorrhage   SAH (subarachnoid hemorrhage)   Discharged Condition: Stable  Hospital Course:  [name] is a 59 y.o. female admitted after sudden HA. She underwent diagnostic angiogram followed by surgical clipping of a right MCA aneurysm. She had a largely uneventful hospital course, with the exception of a transient episode of left-sided weakness which resolved with IV fluid. She was observed further in the ICU, subseuquently transferred to the floor, and was discharged. She was seen by PT/OT who recommended home health PT.  Treatments: Surgery - right pterional craniotomy for aneurysm clipping  Discharge Exam: Blood pressure 146/62, pulse 64, temperature 99 F (37.2 C), temperature source Oral, resp. rate 18, height  (1.499 m), weight 66.1 kg (145 lb 11.6 oz), SpO2 99 %. Awake, alert, oriented Speech fluent, appropriate CN grossly intact 5/5 BUE/BLE Wound c/d/i  Follow-up: Follow-up in my office [facility] [phone]) in 2-4 weeks  Disposition: 01-Home or Self Care  Discharge Instructions    Ambulatory referral to Home Health    Complete by:  As directed   Please evaluate Renee Murray for [facility] to [facility].  Disciplines requested: Physical Therapy  Services to provide: Strengthening Exercises and Evaluate  Physician to follow patient's care (the person listed here will be responsible for signing ongoing orders): Referring Provider  Requested Start of Care Date: Tomorrow  I certify that this patient is under my care and that I, or a Nurse Practitioner or Physician's Assistant working with me, had a face-to-face encounter that meets the physician face-to-face  requirements with patient on 02-17-15. The encounter with the patient was in whole, or in part for the following medical condition(s) which is the primary reason for home health care (List medical condition): subarachnoid hemorrhage  Does the patient have Medicare or Medicaid?:  No  The encounter with the patient was in whole, or in part, for the following medical condition, which is the primary reason for home health care:  subarachnoid hemorrhage  Reason for Medically Necessary Home Health Services:  Therapy- Therapeutic Exercises to Increase Strength and Endurance  My clinical findings support the need for the above services:  Unable to leave home safely without assistance and/or assistive device  I certify that, based on my findings, the following services are medically necessary home health services:  Physical therapy  Further, I certify that my clinical findings support that this patient is homebound due to:  Unable to leave home safely without assistance            Medication List    TAKE these medications        acetaminophen 500 MG tablet  Commonly known as:  TYLENOL  Take 1,000 mg by mouth every 6 (six) hours as needed for moderate pain.     amLODipine 5 MG tablet  Commonly known as:  NORVASC  TAKE ONE TABLET BY MOUTH ONCE DAILY     fluticasone 50 MCG/ACT nasal spray  Commonly known as:  FLONASE  Place 2 sprays into the nose daily.     HYDROcodone-acetaminophen 5-325 MG per tablet  Commonly known as:  NORCO/VICODIN  Take 1 tablet by mouth every 4 (four) hours as needed for moderate pain.     levETIRAcetam 500 MG tablet  Commonly known as:  KEPPRA  Take 1 tablet (500 mg total) by mouth 2 (two) times daily.     naproxen sodium 220 MG tablet  Commonly known as:  ANAPROX  Take 220 mg by mouth daily as needed (pain).     niMODipine 30 MG capsule  Commonly known as:  NIMOTOP  Take 2 capsules (60 mg total) by mouth every 4 (four) hours.         SignedLisbeth Renshaw: Renee Murray,  Renee Murray, C 02/17/2015, 8:22 AM

## 2015-02-25 ENCOUNTER — Emergency Department (HOSPITAL_COMMUNITY): Payer: Managed Care, Other (non HMO)

## 2015-02-25 ENCOUNTER — Emergency Department (HOSPITAL_COMMUNITY)
Admission: EM | Admit: 2015-02-25 | Discharge: 2015-02-25 | Disposition: A | Discharge status: Home / Self Care | Payer: Managed Care, Other (non HMO) | Attending: Emergency Medicine | Admitting: Emergency Medicine

## 2015-02-25 ENCOUNTER — Encounter (HOSPITAL_COMMUNITY): Payer: Self-pay | Admitting: Physical Medicine and Rehabilitation

## 2015-02-25 DIAGNOSIS — Z79899 Other long term (current) drug therapy: Secondary | ICD-10-CM | POA: Insufficient documentation

## 2015-02-25 DIAGNOSIS — Z720 Tobacco use: Secondary | ICD-10-CM | POA: Diagnosis not present

## 2015-02-25 DIAGNOSIS — I10 Essential (primary) hypertension: Secondary | ICD-10-CM | POA: Insufficient documentation

## 2015-02-25 DIAGNOSIS — G8918 Other acute postprocedural pain: Secondary | ICD-10-CM | POA: Insufficient documentation

## 2015-02-25 DIAGNOSIS — R51 Headache: Secondary | ICD-10-CM | POA: Diagnosis present

## 2015-02-25 DIAGNOSIS — Z7951 Long term (current) use of inhaled steroids: Secondary | ICD-10-CM | POA: Insufficient documentation

## 2015-02-25 DIAGNOSIS — R519 Headache, unspecified: Secondary | ICD-10-CM

## 2015-02-25 MED ORDER — ALPRAZOLAM 0.25 MG PO TABS: 0.2500 mg | ORAL_TABLET | Freq: Every evening | ORAL | Status: DC | PRN

## 2015-02-25 NOTE — ED Notes (Signed)
Pt presents to department for evaluation of head pain. States she had brain surgery on 02/04/15. Now reports severe anxiety and pain at incision site. Pt states "I don't know if I like my pain medication." pt states she had follow up appointment with surgeon on Monday. Pt is alert and oriented x4. Surgical site healing appropriately.

## 2015-02-25 NOTE — Discharge Instructions (Signed)
Please follow up with your neurosurgeon on Monday as previously scheduled for further care.  Take xanax as needed for anxiety.  Take pain medication on a schedule to help pain control.  Return if your condition worsen or if you have other concerns.

## 2015-02-25 NOTE — ED Provider Notes (Signed)
CSN: [id number]     Arrival date & time 02/25/15  1316 History   First MD Initiated Contact with Patient 02/25/15 1458     Chief Complaint  Patient presents with  . Headache  . Pain     (Consider location/radiation/quality/duration/timing/severity/associated sxs/prior Treatment) HPI   59 year old female with recent subarachnoid hemorrhage status post right craniotomy for aneurysm repair presenting for evaluation of headache. patient had brain surgery on February 18 and was discharged on March 2. Patient reports she has intermittent pain to the right side of the scalp at the site of surgery as well as pain to the right side of her face. She described pain as a soreness sensation like a toothache. Pain is mildly improved with taking Vicodin and Tylenol. She also endorsed nausea without vomiting. She denies fever, vision changes, neck stiffness, chest pain, short of breath, abdominal pain, new numbness or weakness. The surgical site has been healing appropriately. She is scheduled to be seen by her neurosurgeon next Monday. Family member noticed that the right side of face was a bit puffy a couple days ago and was concerned. Patient also reports feeling anxious and having difficulty with her thought process since the surgery. At the urging of her family member, she is here today for further evaluation of a headache.  Past Medical History  Diagnosis Date  . Hypertension    Past Surgical History  Procedure Laterality Date  . Cholecystectomy    . Abdominal hysterectomy    . Craniotomy Right 02/04/2015    Procedure: RIGHT CRANIOTOMY FOR ANEURYSM ;  Surgeon: Lisbeth RenshawNeelesh Nundkumar, MD;  Location: [facility];  Service: Neurosurgery;  Laterality: Right;   No family history on file. History  Substance Use Topics  . Smoking status: Current Every Day Smoker -- 0.50 packs/day for 20 years  . Smokeless tobacco: Not on file  . Alcohol Use: Yes     Comment: socially   OB History    No data available      Review of Systems  All other systems reviewed and are negative.     Allergies  Demerol and Morphine and related  Home Medications   Prior to Admission medications   Medication Sig Start Date End Date Taking? Authorizing Provider  acetaminophen (TYLENOL) 500 MG tablet Take 1,000 mg by mouth every 6 (six) hours as needed for moderate pain.    Historical Provider, MD  amLODipine (NORVASC) 5 MG tablet Take 1 tablet (5 mg total) by mouth daily. 02/17/15   Lisbeth RenshawNeelesh Nundkumar, MD  fluticasone (FLONASE) 50 MCG/ACT nasal spray Place 2 sprays into the nose daily. Patient taking differently: Place 2 sprays into the nose daily as needed for allergies.  09/27/13   Servando Salinaatherine H Rossi, NP  HYDROcodone-acetaminophen (NORCO/VICODIN) 5-325 MG per tablet Take 1 tablet by mouth every 4 (four) hours as needed for moderate pain. 02/17/15   Lisbeth RenshawNeelesh Nundkumar, MD  levETIRAcetam (KEPPRA) 500 MG tablet Take 1 tablet (500 mg total) by mouth 2 (two) times daily. 02/17/15   Lisbeth RenshawNeelesh Nundkumar, MD  naproxen sodium (ANAPROX) 220 MG tablet Take 220 mg by mouth daily as needed (pain).    Historical Provider, MD  niMODipine (NIMOTOP) 30 MG capsule Take 2 capsules (60 mg total) by mouth every 4 (four) hours. 02/17/15   Lisbeth RenshawNeelesh Nundkumar, MD   BP 111/56 mmHg  Pulse 70  Temp(Src) 98.9 F (37.2 C) (Oral)  Resp 17  Ht 4\' 11"  (1.499 m)  Wt 153 lb (69.4 kg)  BMI 30.89 kg/m2  SpO2 96% Physical Exam  Constitutional: She is oriented to person, place, and time. She appears well-developed and well-nourished. No distress.  HENT:  Right Ear: External ear normal.  Left Ear: External ear normal.  Mouth/Throat: Oropharynx is clear and moist.  Right parietal scalp with a large crescent well-healing laceration without any signs of infection. Minimal tenderness on palpation.  Eyes: Conjunctivae and EOM are normal. Pupils are equal, round, and reactive to light.  Neck: Normal range of motion. Neck supple.  Cardiovascular: Normal rate and  regular rhythm.   Pulmonary/Chest: Effort normal and breath sounds normal.  Abdominal: Soft. There is no tenderness.  Musculoskeletal:  5/5 strength to all 4 extremities  Neurological: She is alert and oriented to person, place, and time.  Neurologic exam:  Speech clear, pupils equal round reactive to light, extraocular movements intact  Normal peripheral visual fields Cranial nerves III through XII normal including no facial droop Follows commands, moves all extremities x4, normal strength to bilateral upper and lower extremities at all major muscle groups including grip Sensation normal to light touch and pinprick Coordination intact, no limb ataxia, finger-nose-finger normal Rapid alternating movements normal No pronator drift Gait normal   Skin: No rash noted.  Psychiatric: She has a normal mood and affect.  Nursing note and vitals reviewed.   ED Course  Procedures (including critical care time)  3:48 PM Patient presents for reevaluation of headache after she had a subarachnoid hemorrhage last month status post brain surgery. On exam, patient has no significant signs of infection at the surgical site. She has no focal neuro deficit. She is afebrile with stable normal vital sign. She has no appreciable deficit and she is alert and oriented 3. Plan to obtain a follow-up head CT scan but I anticipate patient will be able to follow-up with her neurosurgeon as previously scheduled. Patient is concerned about anxiety which is understandable considering her recent SAH.    4:53 PM Head CT demonstrates subacute infarct in the R perisylvian region involving the insular cortex and adjacent frontal lobe s/p R MCA aneurysm clipping.  Resolved SAH.  No midline shift or hydrocephalus. I discussed this finding with Dr. Romeo Apple.  We felt pt is stable to f/u with her neurosurgeon on Monday as previously scheduled.  Recommend continue with her pain medication as scheduled.  Pt will also be  prescribed a short course of antianxiety to use as needed. All questions answered to pt's satisfaction.    Labs Review Labs Reviewed - No data to display  Imaging Review Ct Head Wo Contrast  02/25/2015   CLINICAL DATA:  Post aneurysm surgery on 02/05/2015.  Headache.  EXAM: CT HEAD WITHOUT CONTRAST  TECHNIQUE: Contiguous axial images were obtained from the base of the skull through the vertex without intravenous contrast.  COMPARISON:  Head CT 02/12/2015.  FINDINGS: Status post right frontotemporal craniotomy for aneurysm clipping. The aneurysm clip appears unchanged. Previously demonstrated residual subarachnoid blood around the right sylvian fissure has resolved. There is more well-defined low-density in this area extending into the adjacent right frontal lobe suspicious for subacute infarct. No extra-axial fluid collection, midline shift or hydrocephalus demonstrated.  Mild ethmoid sinus mucosal thickening on the left. There is also partial opacification of the mastoid air cells on the left. No acute calvarial findings.  IMPRESSION: 1. Subacute infarct in the right perisylvian region involving the insular cortex and adjacent frontal lobe status post right MCA aneurysm clipping. 2. Resolved subarachnoid hemorrhage. No midline shift or hydrocephalus. 3. Mild  left ethmoid sinus and mastoid air cell opacification.   Electronically Signed   By: Carey Bullocks M.D.   On: 02/25/2015 16:46     EKG Interpretation None      MDM   Final diagnoses:  Headache    BP 128/63 mmHg  Pulse 59  Temp(Src) 98.9 F (37.2 C) (Oral)  Resp 17  Ht  (1.499 m)  Wt 153 lb (69.4 kg)  BMI 30.89 kg/m2  SpO2 100%  I have reviewed nursing notes and vital signs. I personally reviewed the imaging tests through PACS system  I reviewed available ER/hospitalization records thought the EMR     Fayrene Helper, PA-C 02/25/15 1713  Purvis Sheffield, MD 02/25/15 [phone]

## 2015-02-25 NOTE — ED Notes (Addendum)
Daughter concerned about pt. St pt is having pain to the R side of her body as well that has been going on for 1-2 days.

## 2015-04-09 ENCOUNTER — Other Ambulatory Visit: Payer: Self-pay | Admitting: Internal Medicine

## 2015-04-09 MED ORDER — AMLODIPINE BESYLATE 5 MG PO TABS: 5.0000 mg | ORAL_TABLET | Freq: Every day | ORAL | Status: DC

## 2015-04-09 NOTE — Progress Notes (Signed)
Received a page from the after-hours pager stating patient had run out of blood pressure medications.  Refilled amlodipine 5mg .

## 2015-05-29 NOTE — Op Note (Signed)
DIAGNOSTIC CEREBRAL ANGIOGRAM    OPERATOR:   Dr. Lisbeth Renshaw, MD  HISTORY:   The patient is a 59 y.o. yo female Presenting with subarachnoid hemorrhage.  Diagnostic cerebral angiogram is therefore indicated.  APPROACH:   The technical aspects of the procedure as well as its potential risks and benefits were reviewed with the patient. These risks included but were not limited bleeding, infection, allergic reaction, damage to organs/vital structures, stroke, non-diagnostic procedure, and the catastrophic outcomes of heart attack, coma, and death. With an understanding of these risks, informed consent was obtained and witnessed.    The patient was placed in the supine position on the angiography table and the skin of right groin prepped in the usual sterile fashion. The procedure was performed under local anesthesia (1%-solution of bicarbonate-bufferred Lidoacaine) and conscious sedation with Versed and fentanyl monitored by the in-suite nurse.    A 5- French sheath was introduced in the right common femoral artery using Seldinger technique.  A fluorophase sequence was used to document the sheath position.    HEPARIN: 0 Units total.   CONTRAST AGENT: 100cc, Omnipaque 300   FLUOROSCOPY TIME: 11.0 combined AP and lateral minutes    CATHETER(S) AND WIRE(S):    5-French JB-1 glidecatheter   0.035" glidewire    VESSELS CATHETERIZED:   Right common carotid   Right internal carotid Left common carotid   Right vertebral   Left vertebral   Right common femoral  VESSELS STUDIED:   Aortic arch Right common carotid, neck Right internal carotid, head Right vertebral Left common carotid, neck Left common carotid, head Left vertebral Right femoral  PROCEDURAL NARRATIVE:   A 5-Fr JB-1 terumo glide catheter was advanced over a 0.035 glidewire into the aortic arch. The above vessels were then sequentially catheterized and cervical/cerebral angiograms taken. After review of images, the  catheter was removed without incident.    INTERPRETATION:   Aortic arch:    Type I, With common origin of the innominate and left common carotid artery. No significant ostial stenosis.   Right common carotid: neck:   There is no significant stenosis, occlusion, aneurysm or plaque visualized on this injection.  There is luminal irregularity likely representing mild fibromuscular dysplasia.  Also noted is a retropharyngeal loop of the distal cervical internal carotid artery.  Right internal carotid: head:   Injection reveals the presence of a widely patent ICA, M1, and A1 segments and their branches. There is an aneurysm at the right middle cerebral artery bifurcation.  This aneurysm projected laterally, and is irregular in shape, with at least 2 lobes.  It appears to  Incorporate the origin of the superior division of the right middle cerebral artery..  The parenchymal and venous phases are normal. The venous sinuses are widely patent.    Left common carotid: neck:   There is no significant stenosis, occlusion, aneurysm or plaque visualized on this injection.    Left common carotid: head:   Injection reveals the presence of a widely patent ICA, A1, and M1 segments and their branches. There is no significant stenosis, occlusion, aneurysm, or high flow vascular malformation visualized. The parenchymal and venous phases are normal. The venous sinuses are widely patent.    Left vertebral:   Injection reveals the presence of a widely patent vertebral artery. This leads to a widely patent basilar artery that terminates in bilateral P1. The basilar apex is normal. There is no significant stenosis, occlusion, aneurysm, or vascular malformation visualized. The parenchymal and venous phases are  normal. The venous sinuses are widely patent.    Right vertebral:    Normal vessel. No PICA aneurysm. See basilar description above.    Right femoral:    Normal vessel. No significant atherosclerotic disease.  Arterial sheath in adequate position.   DISPOSITION:  Upon completion of the study, the femoral sheath was removed and hemostasis obtained using a 5-Fr ExoSeal closure device. Good proximal and distal lower extremity pulses were documented upon achievement of hemostasis.    The procedure was well tolerated and no early complications were observed.       The patient was transferred to the operating room for further care.    IMPRESSION:  1. Laterally directed, irregular right middle cerebral artery aneurysm as the likely source of subarachnoid hemorrhage. 2.  Cervical right internal carotid artery tortuosity, and evidence of fibromuscular dysplasia.  The preliminary results of this procedure were shared with the patient and the patient's family.

## 2015-06-23 ENCOUNTER — Ambulatory Visit: Payer: Managed Care, Other (non HMO) | Attending: Family Medicine

## 2015-06-26 ENCOUNTER — Other Ambulatory Visit: Payer: Self-pay | Admitting: Internal Medicine

## 2015-06-29 NOTE — Telephone Encounter (Signed)
Patient will need an appointment before additional refills will be provided.  Thanks.

## 2015-07-27 ENCOUNTER — Encounter (HOSPITAL_COMMUNITY): Payer: Self-pay | Admitting: Emergency Medicine

## 2015-07-27 ENCOUNTER — Emergency Department (INDEPENDENT_AMBULATORY_CARE_PROVIDER_SITE_OTHER)
Admission: EM | Admit: 2015-07-27 | Discharge: 2015-07-27 | Disposition: A | Discharge status: Home / Self Care | Payer: Self-pay | Source: Home / Self Care | Attending: Emergency Medicine | Admitting: Emergency Medicine

## 2015-07-27 DIAGNOSIS — G8929 Other chronic pain: Secondary | ICD-10-CM

## 2015-07-27 DIAGNOSIS — F419 Anxiety disorder, unspecified: Secondary | ICD-10-CM

## 2015-07-27 DIAGNOSIS — I10 Essential (primary) hypertension: Secondary | ICD-10-CM

## 2015-07-27 MED ORDER — ALPRAZOLAM 0.25 MG PO TABS: 0.2500 mg | ORAL_TABLET | Freq: Every evening | ORAL | Status: DC | PRN

## 2015-07-27 MED ORDER — AMLODIPINE BESYLATE 5 MG PO TABS: 5.0000 mg | ORAL_TABLET | Freq: Every day | ORAL | Status: DC

## 2015-07-27 NOTE — Discharge Instructions (Signed)
I have provided 3 months of your blood pressure medicine. I gave you a prescription for 15 tablets of the Xanax. Please use these daily as needed for severe anxiety. Follow-up at the [facility] and [facility] as soon as you can. You can try getting an appointment at [facility] to see about medication for depression and anxiety.

## 2015-07-27 NOTE — ED Provider Notes (Signed)
CSN: [id number]     Arrival date & time 07/27/15  1304 History   First MD Initiated Contact with Patient 07/27/15 1353     Chief Complaint  Patient presents with  . Medication Refill   (Consider location/radiation/quality/duration/timing/severity/associated sxs/prior Treatment) HPI She is a 59 year old woman here for medication refill. She states she needs refills of her blood pressure medicines. She has a history of a right craniotomy for aneurysm. She is taking amlodipine for her blood pressure. She recently lost her insurance and has just [facility]. She has not yet been able to get an appointment there.  She also was recently diagnosed with depression and anxiety at a disability evaluation. She has been on Xanax as needed in the past. She states she only takes it when she is unable to sleep due to anxiety issues. She also reports various chronic pain issues.  Past Medical History  Diagnosis Date  . Hypertension    Past Surgical History  Procedure Laterality Date  . Cholecystectomy    . Abdominal hysterectomy    . Craniotomy Right 02/04/2015    Procedure: RIGHT CRANIOTOMY FOR ANEURYSM ;  Surgeon: Lisbeth Renshaw, MD;  Location: [facility];  Service: Neurosurgery;  Laterality: Right;   History reviewed. No pertinent family history. History  Substance Use Topics  . Smoking status: Current Every Day Smoker -- 0.50 packs/day for 20 years  . Smokeless tobacco: Not on file  . Alcohol Use: Yes     Comment: socially   OB History    No data available     Review of Systems As in history of present illness Allergies  Demerol and Morphine and related  Home Medications   Prior to Admission medications   Medication Sig Start Date End Date Taking? Authorizing Provider  acetaminophen (TYLENOL) 500 MG tablet Take 1,000 mg by mouth every 6 (six) hours as needed for moderate pain.   Yes Historical Provider, MD   HYDROcodone-acetaminophen (NORCO/VICODIN) 5-325 MG per tablet Take 1 tablet by mouth every 4 (four) hours as needed for moderate pain. 02/17/15  Yes Lisbeth Renshaw, MD  ALPRAZolam Prudy Feeler) 0.25 MG tablet Take 1 tablet (0.25 mg total) by mouth at bedtime as needed for anxiety. 07/27/15   Charm Rings, MD  amLODipine (NORVASC) 5 MG tablet Take 1 tablet (5 mg total) by mouth daily. 07/27/15   Charm Rings, MD  fluticasone (FLONASE) 50 MCG/ACT nasal spray Place 2 sprays into the nose daily. Patient taking differently: Place 2 sprays into the nose daily as needed for allergies.  09/27/13   Servando Salina, NP  levETIRAcetam (KEPPRA) 500 MG tablet Take 1 tablet (500 mg total) by mouth 2 (two) times daily. 02/17/15   Lisbeth Renshaw, MD  naproxen sodium (ANAPROX) 220 MG tablet Take 220 mg by mouth daily as needed (pain).    Historical Provider, MD  niMODipine (NIMOTOP) 30 MG capsule Take 2 capsules (60 mg total) by mouth every 4 (four) hours. 02/17/15   Lisbeth Renshaw, MD   BP 123/85 mmHg  Pulse 68  Temp(Src) 97.9 F (36.6 C) (Oral)  Resp 14  SpO2 98% Physical Exam  Constitutional: She is oriented to person, place, and time. She appears well-developed and well-nourished. No distress.  Cardiovascular: Normal rate, regular rhythm and normal heart sounds.   No murmur heard. Pulmonary/Chest: Effort normal and breath sounds normal. No respiratory distress. She has no wheezes. She has no rales.  Neurological: She is alert  and oriented to person, place, and time.  Psychiatric: She has a normal mood and affect.    ED Course  Procedures (including critical care time) Labs Review Labs Reviewed - No data to display  Imaging Review No results found.   MDM   1. Essential hypertension   2. Anxiety   3. Chronic pain    I provided a 3 month supply of her amlodipine. This should allow plenty of time to be seen [facility]. I provided 15 tablets of Xanax to use as needed for  severe anxiety. I recommended being seen at [facility] for additional evaluation of psychiatric issues.    Charm Rings, MD 07/27/15 [phone]

## 2015-07-27 NOTE — ED Notes (Signed)
Pt is associated with [employer].  She was unable to get an appointment to get her anxiety and blood pressure medication refilled in time.  She was told that they are not taking any appointments at all until September.

## 2015-07-29 ENCOUNTER — Ambulatory Visit: Payer: Self-pay | Admitting: Family Medicine

## 2015-08-04 ENCOUNTER — Ambulatory Visit: Payer: Self-pay | Admitting: Family Medicine

## 2015-08-11 ENCOUNTER — Emergency Department (HOSPITAL_COMMUNITY)
Admission: EM | Admit: 2015-08-11 | Discharge: 2015-08-11 | Disposition: A | Discharge status: Home / Self Care | Payer: Self-pay | Attending: Emergency Medicine | Admitting: Emergency Medicine

## 2015-08-11 ENCOUNTER — Encounter (HOSPITAL_COMMUNITY): Payer: Self-pay | Admitting: *Deleted

## 2015-08-11 ENCOUNTER — Emergency Department (HOSPITAL_COMMUNITY): Payer: Self-pay

## 2015-08-11 DIAGNOSIS — Z720 Tobacco use: Secondary | ICD-10-CM | POA: Insufficient documentation

## 2015-08-11 DIAGNOSIS — Z79899 Other long term (current) drug therapy: Secondary | ICD-10-CM | POA: Insufficient documentation

## 2015-08-11 DIAGNOSIS — I10 Essential (primary) hypertension: Secondary | ICD-10-CM | POA: Insufficient documentation

## 2015-08-11 DIAGNOSIS — R51 Headache: Secondary | ICD-10-CM | POA: Insufficient documentation

## 2015-08-11 DIAGNOSIS — R519 Headache, unspecified: Secondary | ICD-10-CM

## 2015-08-11 HISTORY — DX: Cerebral aneurysm, nonruptured: I67.1

## 2015-08-11 NOTE — Discharge Instructions (Signed)
Resume using your Flonase twice a day. Continue taking Claritin. CT scan shows no sign of infection in your sinuses, so you do not need to be on an antibiotic.   Take ibuprofen or acetaminophen as needed for pain. Use ice or heat as needed.  General Headache Without Cause A headache is pain or discomfort felt around the head or neck area. The specific cause of a headache may not be found. There are many causes and types of headaches. A few common ones are:  Tension headaches.  Migraine headaches.  Cluster headaches.  Chronic daily headaches. HOME CARE INSTRUCTIONS   Keep all follow-up appointments with your caregiver or any specialist referral.  Only take over-the-counter or prescription medicines for pain or discomfort as directed by your caregiver.  Lie down in a dark, quiet room when you have a headache.  Keep a headache journal to find out what may trigger your migraine headaches. For example, write down:  What you eat and drink.  How much sleep you get.  Any change to your diet or medicines.  Try massage or other relaxation techniques.  Put ice packs or heat on the head and neck. Use these 3 to 4 times per day for 15 to 20 minutes each time, or as needed.  Limit stress.  Sit up straight, and do not tense your muscles.  Quit smoking if you smoke.  Limit alcohol use.  Decrease the amount of caffeine you drink, or stop drinking caffeine.  Eat and sleep on a regular schedule.  Get 7 to 9 hours of sleep, or as recommended by your caregiver.  Keep lights dim if bright lights bother you and make your headaches worse. SEEK MEDICAL CARE IF:   You have problems with the medicines you were prescribed.  Your medicines are not working.  You have a change from the usual headache.  You have nausea or vomiting. SEEK IMMEDIATE MEDICAL CARE IF:   Your headache becomes severe.  You have a fever.  You have a stiff neck.  You have loss of vision.  You have muscular  weakness or loss of muscle control.  You start losing your balance or have trouble walking.  You feel faint or pass out.  You have severe symptoms that are different from your first symptoms. MAKE SURE YOU:   Understand these instructions.  Will watch your condition.  Will get help right away if you are not doing well or get worse. Document Released: 12/04/2005 Document Revised: 02/26/2012 Document Reviewed: 12/20/2011 [facility] Patient Information 2015 [place], [place]. This information is not intended to replace advice given to you by your health care provider. Make sure you discuss any questions you have with your health care provider.

## 2015-08-11 NOTE — ED Provider Notes (Signed)
CSN: [id number]     Arrival date & time 08/11/15  1647 History   First MD Initiated Contact with Patient 08/11/15 1836     Chief Complaint  Patient presents with  . Headache     (Consider location/radiation/quality/duration/timing/severity/associated sxs/prior Treatment) Patient is a 59 y.o. female presenting with headaches. The history is provided by the patient.  Headache She had craniotomy for aneurysm clipping in February. For the last week, she has been having frontal headaches with sinus drainage which she thinks is related to sinusitis per G is been taking Claritin without relief. She relates that she had been using Flonase before her craniotomy but has not used it since then. Today, she developed a right-sided occipital headache which seemed to radiate down into the neck. It was as severe as 8/10. This is different from other headaches she has had but is now completely resolved. She denies photophobia or phonophobia. There was no visual disturbance. There is no nausea or vomiting. There is no weakness, numbness, tingling.  Past Medical History  Diagnosis Date  . Hypertension   . Brain aneurysm    Past Surgical History  Procedure Laterality Date  . Cholecystectomy    . Abdominal hysterectomy    . Craniotomy Right 02/04/2015    Procedure: RIGHT CRANIOTOMY FOR ANEURYSM ;  Surgeon: Lisbeth Renshaw, MD;  Location: [facility];  Service: Neurosurgery;  Laterality: Right;   History reviewed. No pertinent family history. Social History  Substance Use Topics  . Smoking status: Current Every Day Smoker -- 0.50 packs/day for 20 years  . Smokeless tobacco: None  . Alcohol Use: Yes     Comment: socially   OB History    No data available     Review of Systems  Neurological: Positive for headaches.  All other systems reviewed and are negative.     Allergies  Demerol and Morphine and related  Home Medications   Prior to Admission medications   Medication Sig Start Date  End Date Taking? Authorizing Provider  acetaminophen (TYLENOL) 500 MG tablet Take 1,000 mg by mouth every 6 (six) hours as needed for moderate pain.    Historical Provider, MD  ALPRAZolam Prudy Feeler) 0.25 MG tablet Take 1 tablet (0.25 mg total) by mouth at bedtime as needed for anxiety. 07/27/15   Charm Rings, MD  amLODipine (NORVASC) 5 MG tablet Take 1 tablet (5 mg total) by mouth daily. 07/27/15   Charm Rings, MD  fluticasone (FLONASE) 50 MCG/ACT nasal spray Place 2 sprays into the nose daily. Patient taking differently: Place 2 sprays into the nose daily as needed for allergies.  09/27/13   Servando Salina, NP  HYDROcodone-acetaminophen (NORCO/VICODIN) 5-325 MG per tablet Take 1 tablet by mouth every 4 (four) hours as needed for moderate pain. 02/17/15   Lisbeth Renshaw, MD  levETIRAcetam (KEPPRA) 500 MG tablet Take 1 tablet (500 mg total) by mouth 2 (two) times daily. 02/17/15   Lisbeth Renshaw, MD  naproxen sodium (ANAPROX) 220 MG tablet Take 220 mg by mouth daily as needed (pain).    Historical Provider, MD  niMODipine (NIMOTOP) 30 MG capsule Take 2 capsules (60 mg total) by mouth every 4 (four) hours. 02/17/15   Lisbeth Renshaw, MD   BP 138/73 mmHg  Pulse 78  Temp(Src) 98.3 F (36.8 C) (Oral)  Resp 18  SpO2 97% Physical Exam  Nursing note and vitals reviewed.  59 year old female, resting comfortably and in no acute distress. Vital signs are normal. Oxygen  saturation is 97%, which is normal. Head is normocephalic and atraumatic. PERRLA, EOMI. Oropharynx is clear. There is mild tenderness to palpation over the frontal sinuses. Examination the nasal cavity shows no edema of the turbinates and no purulent drainage. Neck is nontender and supple without adenopathy or JVD. Back is nontender and there is no CVA tenderness. Lungs are clear without rales, wheezes, or rhonchi. Chest is nontender. Heart has regular rate and rhythm without murmur. Abdomen is soft, flat, nontender without masses or  hepatosplenomegaly and peristalsis is normoactive. Extremities have no cyanosis or edema, full range of motion is present. Skin is warm and dry without rash. Neurologic: Mental status is normal, cranial nerves are intact, there are no motor or sensory deficits.  ED Course  Procedures (including critical care time)  Imaging Review Ct Head Wo Contrast  08/11/2015   CLINICAL DATA:  History of aneurysm clipping. Sinus congestion headaches.  EXAM: CT HEAD WITHOUT CONTRAST  TECHNIQUE: Contiguous axial images were obtained from the base of the skull through the vertex without intravenous contrast.  COMPARISON:  02/25/2015  FINDINGS: Encephalomalacia involving the anterior temporal lobe is noted. Right MCA aneurysm clip is identified and appears stable in position. The ventricular volumes and sulci are within normal limits. No evidence for acute intracranial hemorrhage, mass or cortical infarct. No abnormal extra-axial fluid collections identified. The midline is maintained. The ir is mild mucosal thickening involving the ethmoid air cells and frontal sinus. The mastoid air cells are clear. Craniotomy defect identified involving the right frontal and temporal bones.  IMPRESSION: 1. No acute intracranial abnormalities. 2. Status post right MCA aneurysm clipping.   Electronically Signed   By: Signa Kell M.D.   On: 08/11/2015 19:28   I have personally reviewed and evaluated these images results as part of my medical decision-making.   MDM   Final diagnoses:  Headache, unspecified headache type    Headache that appears to have resolved. Possible sinusitis to account for her frontal headache. She is very concerned about her recent aneurysm clipping so she will be sent for CT scan. Old records reviewed and she has another ED visit for headache since her aneurysm clipping but also required CT scan for patient reassurance. She is encouraged to go back on her Flonase to treat her sinus problems.  CT shows  no evidence of rebleed from her aneurysm and no evidence of sinusitis. She is given reassurance and discharged.  Dione Booze, MD 08/11/15 2036

## 2015-08-11 NOTE — ED Notes (Signed)
Pt reports hx of brain aneurysm in February. Having sinus congestion and headaches for extended time but today is also having posterior headache. Denies n/v. No distress noted at triage.

## 2015-08-17 ENCOUNTER — Ambulatory Visit: Payer: Self-pay | Admitting: Family Medicine

## 2015-08-31 ENCOUNTER — Telehealth: Payer: Self-pay | Admitting: Internal Medicine

## 2015-09-13 ENCOUNTER — Ambulatory Visit (INDEPENDENT_AMBULATORY_CARE_PROVIDER_SITE_OTHER): Payer: Worker's Compensation | Admitting: Family Medicine

## 2015-09-13 ENCOUNTER — Encounter: Payer: Self-pay | Admitting: Family Medicine

## 2015-09-13 VITALS — BP 136/61 | HR 64 | Temp 98.1°F | Resp 16 | Ht 59.0 in | Wt 152.0 lb

## 2015-09-13 DIAGNOSIS — Z0000 Encounter for general adult medical examination without abnormal findings: Secondary | ICD-10-CM

## 2015-09-13 DIAGNOSIS — R4589 Other symptoms and signs involving emotional state: Secondary | ICD-10-CM | POA: Insufficient documentation

## 2015-09-13 DIAGNOSIS — R52 Pain, unspecified: Secondary | ICD-10-CM

## 2015-09-13 DIAGNOSIS — Z23 Encounter for immunization: Secondary | ICD-10-CM

## 2015-09-13 DIAGNOSIS — Z889 Allergy status to unspecified drugs, medicaments and biological substances status: Secondary | ICD-10-CM | POA: Insufficient documentation

## 2015-09-13 DIAGNOSIS — Z720 Tobacco use: Secondary | ICD-10-CM

## 2015-09-13 DIAGNOSIS — I10 Essential (primary) hypertension: Secondary | ICD-10-CM

## 2015-09-13 DIAGNOSIS — N390 Urinary tract infection, site not specified: Secondary | ICD-10-CM

## 2015-09-13 DIAGNOSIS — G629 Polyneuropathy, unspecified: Secondary | ICD-10-CM

## 2015-09-13 DIAGNOSIS — G8929 Other chronic pain: Secondary | ICD-10-CM

## 2015-09-13 DIAGNOSIS — F418 Other specified anxiety disorders: Secondary | ICD-10-CM | POA: Insufficient documentation

## 2015-09-13 LAB — CBC WITH DIFFERENTIAL/PLATELET
BASOS PCT: 1 % (ref 0–1)
Basophils Absolute: 0.1 10*3/uL (ref 0.0–0.1)
EOS PCT: 4 % (ref 0–5)
Eosinophils Absolute: 0.2 10*3/uL (ref 0.0–0.7)
HCT: 40.3 % (ref 36.0–46.0)
Hemoglobin: 13.4 g/dL (ref 12.0–15.0)
Lymphocytes Relative: 50 % — ABNORMAL HIGH (ref 12–46)
Lymphs Abs: 3 10*3/uL (ref 0.7–4.0)
MCH: 29.8 pg (ref 26.0–34.0)
MCHC: 33.3 g/dL (ref 30.0–36.0)
MCV: 89.6 fL (ref 78.0–100.0)
MONO ABS: 0.4 10*3/uL (ref 0.1–1.0)
MPV: 11.5 fL (ref 8.6–12.4)
Monocytes Relative: 7 % (ref 3–12)
Neutro Abs: 2.2 10*3/uL (ref 1.7–7.7)
Neutrophils Relative %: 38 % — ABNORMAL LOW (ref 43–77)
Platelets: 233 10*3/uL (ref 150–400)
RBC: 4.5 MIL/uL (ref 3.87–5.11)
RDW: 14.8 % (ref 11.5–15.5)
WBC: 5.9 10*3/uL (ref 4.0–10.5)

## 2015-09-13 LAB — LIPID PANEL
CHOL/HDL RATIO: 3.9 ratio (ref <=5.0)
Cholesterol: 163 mg/dL (ref 125–200)
HDL: 42 mg/dL — AB (ref >=46)
LDL CALC: 99 mg/dL (ref <130)
Triglycerides: 109 mg/dL (ref <150)
VLDL: 22 mg/dL (ref <30)

## 2015-09-13 LAB — POCT URINALYSIS DIP (DEVICE)
BILIRUBIN URINE: NEGATIVE
Glucose, UA: NEGATIVE mg/dL
Ketones, ur: NEGATIVE mg/dL
Nitrite: POSITIVE — AB
PROTEIN: NEGATIVE mg/dL
Specific Gravity, Urine: 1.02 (ref 1.005–1.030)
Urobilinogen, UA: 1 mg/dL (ref 0.0–1.0)
pH: 7 (ref 5.0–8.0)

## 2015-09-13 LAB — COMPLETE METABOLIC PANEL WITH GFR
ALT: 18 U/L (ref 6–29)
AST: 19 U/L (ref 10–35)
Albumin: 4.2 g/dL (ref 3.6–5.1)
Alkaline Phosphatase: 84 U/L (ref 33–130)
BUN: 8 mg/dL (ref 7–25)
CO2: 27 mmol/L (ref 20–31)
Calcium: 9.2 mg/dL (ref 8.6–10.4)
Chloride: 110 mmol/L (ref 98–110)
Creat: 0.6 mg/dL (ref 0.50–0.99)
GFR, Est African American: >89 mL/min (ref >=60)
Glucose, Bld: 81 mg/dL (ref 65–99)
Potassium: 4.1 mmol/L (ref 3.5–5.3)
Sodium: 139 mmol/L (ref 135–146)
Total Bilirubin: 0.4 mg/dL (ref 0.2–1.2)
Total Protein: 6.9 g/dL (ref 6.1–8.1)

## 2015-09-13 MED ORDER — AMLODIPINE BESYLATE 5 MG PO TABS: 5.0000 mg | ORAL_TABLET | Freq: Every day | ORAL | Status: DC

## 2015-09-13 MED ORDER — CIPROFLOXACIN HCL 250 MG PO TABS: 500.0000 mg | ORAL_TABLET | Freq: Two times a day (BID) | ORAL | Status: DC

## 2015-09-13 MED ORDER — GABAPENTIN 100 MG PO CAPS: 100.0000 mg | ORAL_CAPSULE | Freq: Three times a day (TID) | ORAL | Status: DC

## 2015-09-13 NOTE — Progress Notes (Signed)
Subjective:    Patient ID: Renee Murray, female    DOB: 1956/05/22, 59 y.o.   MRN: [id number]  HPI Renee Murray, a 59 year old female with a history of a brain aneurysm and hypertension presents to establish care. Patient reports a history of an aneurysm. She states that she had the worse headache of her life on 02/04/2015 and was found to have a ruptured aneurysm. Aneurysm was surgically clipped on 02/12/2015.  Patient was recently evaluated for a headache in the emergency department on 08/11/2015. CT of head showed no acute abnormalities. She is followed by Dr. Conchita Paris, neurosurgery.    Ms. Ende has a history of hypertension. She states that blood pressure has been controlled with Norvasc.  She is not exercising and is not adherent to low salt diet. She does not check blood pressures at home.  Patient denies chest pain, dyspnea, fatigue, lower extremity edema, near-syncope, palpitations and tachypnea.  Cardiovascular risk factors include: hypertension, sedentary lifestyle and smoking/ tobacco exposure.   Patient reports a history of generalized pain. She state that pain has been present for greater than 1 year. She describes pain as burning, shooting, and constant. She reports numbness and tingling to extremities. She maintains that pain is worse at night.  She states that pain intensity is 5/10 unrelieved by OTC Tylenol or Aleve.   Patient is complaining of anxiety related to health. Patient states that she has experienced increased anxiety since her anerysm in February. She state that she is unable to distinguish a regular headache from an aneurysm. She states that she lives alone and is constantly in fear. She maintains that she was on Xanax, but has been out of medication for several weeks.  She denies suicidal or homicidal ideations.    Past Medical History  Diagnosis Date  . Hypertension   . Brain aneurysm    Social History   Social History  . Marital Status: Divorced   Spouse Name: N/A  . Number of Children: N/A  . Years of Education: N/A   Occupational History  . Not on file.   Social History Main Topics  . Smoking status: Current Every Day Smoker -- 0.50 packs/day for 20 years  . Smokeless tobacco: Not on file  . Alcohol Use: Yes     Comment: socially  . Drug Use: No  . Sexual Activity: Not on file   Other Topics Concern  . Not on file   Social History Narrative   Immunization History  Administered Date(s) Administered  . Influenza,inj,Quad PF,36+ Mos 09/13/2015   Allergies  Allergen Reactions  . Demerol Other (See Comments)    "almost died"  . Morphine And Related Other (See Comments)    "Almost Died"   Review of Systems  Constitutional: Positive for fatigue. Negative for fever and unexpected weight change.  Eyes: Negative for visual disturbance.  Cardiovascular: Negative.  Negative for chest pain and palpitations.  Gastrointestinal: Negative.   Endocrine: Positive for cold intolerance. Negative for heat intolerance, polydipsia, polyphagia and polyuria.  Musculoskeletal: Positive for myalgias.  Skin: Negative.   Neurological: Positive for weakness, numbness and headaches. Negative for dizziness, tremors and facial asymmetry.  Psychiatric/Behavioral: Positive for agitation. The patient is nervous/anxious.        Objective:   Physical Exam  Constitutional: She appears well-developed and well-nourished.  HENT:  Head: Normocephalic and atraumatic.  Right Ear: External ear normal.  Left Ear: External ear normal.  Mouth/Throat: Oropharynx is clear and moist.  Eyes:  Conjunctivae and EOM are normal. Pupils are equal, round, and reactive to light.  Neck: Trachea normal and normal range of motion.  Cardiovascular: Normal rate, regular rhythm, normal heart sounds and normal pulses.   Pulmonary/Chest: Effort normal and breath sounds normal.  Abdominal: Soft. Normal appearance and bowel sounds are normal.  Musculoskeletal:        Right shoulder: She exhibits decreased range of motion.       Left shoulder: She exhibits decreased range of motion and tenderness.       Right wrist: She exhibits tenderness.       Left wrist: She exhibits tenderness.       Right knee: Tenderness found.       Left knee: Tenderness found.  Lymphadenopathy:       Head (right side): No submental and no submandibular adenopathy present.       Head (left side): No submental and no submandibular adenopathy present.  Neurological: She has normal strength. No cranial nerve deficit or sensory deficit.  Psychiatric: Her speech is normal. Her mood appears anxious. She expresses no homicidal and no suicidal ideation.         BP 136/61 mmHg  Pulse 64  Temp(Src) 98.1 F (36.7 C) (Oral)  Resp 16  Ht  (1.499 m)  Wt 152 lb (68.947 kg)  BMI 30.68 kg/m2 Assessment & Plan:  1. Hypertension, goal below 150/90 Blood pressure is at goal on current medication regimen. Will check urinalysis for proteinuria. Recommend a lowfat, low carbohydrate diet divided over 5-6 small meals, increase water intake to 6-8 glasses, and 150 minutes per week of cardiovascular exercise.   - amLODipine (NORVASC) 5 MG tablet; Take 1 tablet (5 mg total) by mouth daily.  Dispense: 30 tablet; Refill: 5 - POCT urinalysis dipstick - COMPLETE METABOLIC PANEL WITH GFR  2. Neuropathy Patient complaining of widespread neuropathic pain and periodic numbness and tingling to extremites. Will start a 1 month trial of gabapentin.  - gabapentin (NEURONTIN) 100 MG capsule; Take 1 capsule (100 mg total) by mouth 3 (three) times daily.  Dispense: 90 capsule; Refill: 0  3. Chronic generalized pain  - Sedimentation Rate  4. Anxiety about health  - Ambulatory referral to Psychology  5. Tobacco abuse Smoking cessation instruction/counseling given:  counseled patient on the dangers of tobacco use, advised patient to stop smoking, and reviewed strategies to maximize success  6.  Health care maintenance - COMPLETE METABOLIC PANEL WITH GFR - Lipid Panel - CBC with Differential - Hemoglobin A1c Recommend screening mammogram Patient is s/p hysterectomy. Do not recommend pap smear  7. Need for immunization against influenza  - Flu Vaccine QUAD 36+ mos IM (Fluarix)  8. Urinary tract infection without hematuria, site unspecified Urinalysis showed positive nitrites and moderate leukocytes. Will start 3 day course of ciprofloxacin. Recommend increase water intake, wipe from front to back, and empty bladder completely with urination.   - ciprofloxacin (CIPRO) 250 MG tablet; Take 2 tablets (500 mg total) by mouth 2 (two) times daily.  Dispense: 6 tablet; Refill: 0 - Urine culture   RTC: 1 month for neuropathy and generalized pain  Hollis,Lachina M, FNP   The patient was given clear instructions to go to ER or return to medical center if symptoms do not improve, worsen or new problems develop. The patient verbalized understanding. Will notify patient with laboratory results.

## 2015-09-13 NOTE — Patient Instructions (Signed)
Neuropathic Pain We often think that pain has a physical cause. If we get rid of the cause, the pain should go away. Nerves themselves can also cause pain. It is called neuropathic pain, which means nerve abnormality. It may be difficult for the patients who have it and for the treating caregivers. Pain is usually described as acute (short-lived) or chronic (long-lasting). Acute pain is related to the physical sensations caused by an injury. It can last from a few seconds to many weeks, but it usually goes away when normal healing occurs. Chronic pain lasts beyond the typical healing time. With neuropathic pain, the nerve fibers themselves may be damaged or injured. They then send incorrect signals to other pain centers. The pain you feel is real, but the cause is not easy to find.  CAUSES  Chronic pain can result from diseases, such as diabetes and shingles (an infection related to chickenpox), or from trauma, surgery, or amputation. It can also happen without any known injury or disease. The nerves are sending pain messages, even though there is no identifiable cause for such messages.   Other common causes of neuropathy include diabetes, phantom limb pain, or Regional Pain Syndrome (RPS).  As with all forms of chronic back pain, if neuropathy is not correctly treated, there can be a number of associated problems that lead to a downward cycle for the patient. These include depression, sleeplessness, feelings of fear and anxiety, limited social interaction and inability to do normal daily activities or work.  The most dramatic and mysterious example of neuropathic pain is called "phantom limb syndrome." This occurs when an arm or a leg has been removed because of illness or injury. The brain still gets pain messages from the nerves that originally carried impulses from the missing limb. These nerves now seem to misfire and cause troubling pain.  Neuropathic pain often seems to have no cause. It responds  poorly to standard pain treatment. Neuropathic pain can occur after:  Shingles (herpes zoster virus infection).  A lasting burning sensation of the skin, caused usually by injury to a peripheral nerve.  Peripheral neuropathy which is widespread nerve damage, often caused by diabetes or alcoholism.  Phantom limb pain following an amputation.  Facial nerve problems (trigeminal neuralgia).  Multiple sclerosis.  Reflex sympathetic dystrophy.  Pain which comes with cancer and cancer chemotherapy.  Entrapment neuropathy such as when pressure is put on a nerve such as in carpal tunnel syndrome.  Back, leg, and hip problems (sciatica).  Spine or back surgery.  HIV Infection or AIDS where nerves are infected by viruses. Your caregiver can explain items in the above list which may apply to you. SYMPTOMS  Characteristics of neuropathic pain are:  Severe, sharp, electric shock-like, shooting, lightening-like, knife-like.  Pins and needles sensation.  Deep burning, deep cold, or deep ache.  Persistent numbness, tingling, or weakness.  Pain resulting from light touch or other stimulus that would not usually cause pain.  Increased sensitivity to something that would normally cause pain, such as a pinprick. Pain may persist for months or years following the healing of damaged tissues. When this happens, pain signals no longer sound an alarm about current injuries or injuries about to happen. Instead, the alarm system itself is not working correctly.  Neuropathic pain may get worse instead of better over time. For some people, it can lead to serious disability. It is important to be aware that severe injury in a limb can occur without a proper, protective pain   response.Burns, cuts, and other injuries may go unnoticed. Without proper treatment, these injuries can become infected or lead to further disability. Take any injury seriously, and consult your caregiver for treatment. DIAGNOSIS    When you have a pain with no known cause, your caregiver will probably ask some specific questions:   Do you have any other conditions, such as diabetes, shingles, multiple sclerosis, or HIV infection?  How would you describe your pain? (Neuropathic pain is often described as shooting, stabbing, burning, or searing.)  Is your pain worse at any time of the day? (Neuropathic pain is usually worse at night.)  Does the pain seem to follow a certain physical pathway?  Does the pain come from an area that has missing or injured nerves? (An example would be phantom limb pain.)  Is the pain triggered by minor things such as rubbing against the sheets at night? These questions often help define the type of pain involved. Once your caregiver knows what is happening, treatment can begin. Anticonvulsant, antidepressant drugs, and various pain relievers seem to work in some cases. If another condition, such as diabetes is involved, better management of that disorder may relieve the neuropathic pain.  TREATMENT  Neuropathic pain is frequently long-lasting and tends not to respond to treatment with narcotic type pain medication. It may respond well to other drugs such as antiseizure and antidepressant medications. Usually, neuropathic problems do not completely go away, but partial improvement is often possible with proper treatment. Your caregivers have large numbers of medications available to treat you. Do not be discouraged if you do not get immediate relief. Sometimes different medications or a combination of medications will be tried before you receive the results you are hoping for. See your caregiver if you have pain that seems to be coming from nowhere and does not go away. Help is available.  SEEK IMMEDIATE MEDICAL CARE IF:   There is a sudden change in the quality of your pain, especially if the change is on only one side of the body.  You notice changes of the skin, such as redness, black or  purple discoloration, swelling, or an ulcer.  You cannot move the affected limbs. Document Released: 08/31/2004 Document Revised: 02/26/2012 Document Reviewed: 08/31/2004 [facility] Patient Information 2015 [place], [place]. This information is not intended to replace advice given to you by your health care provider. Make sure you discuss any questions you have with your health care provider. DASH Eating Plan DASH stands for "Dietary Approaches to Stop Hypertension." The DASH eating plan is a healthy eating plan that has been shown to reduce high blood pressure (hypertension). Additional health benefits may include reducing the risk of type 2 diabetes mellitus, heart disease, and stroke. The DASH eating plan may also help with weight loss. WHAT DO I NEED TO KNOW ABOUT THE DASH EATING PLAN? For the DASH eating plan, you will follow these general guidelines:  Choose foods with a percent daily value for sodium of less than 5% (as listed on the food label).  Use salt-free seasonings or herbs instead of table salt or sea salt.  Check with your health care provider or pharmacist before using salt substitutes.  Eat lower-sodium products, often labeled as "lower sodium" or "no salt added."  Eat fresh foods.  Eat more vegetables, fruits, and low-fat dairy products.  Choose whole grains. Look for the word "whole" as the first word in the ingredient list.  Choose fish and skinless chicken or [place] more often than red meat.  Limit fish, poultry, and meat to 6 oz (170 g) each day.  Limit sweets, desserts, sugars, and sugary drinks.  Choose heart-healthy fats.  Limit cheese to 1 oz (28 g) per day.  Eat more home-cooked food and less restaurant, buffet, and fast food.  Limit fried foods.  Cook foods using methods other than frying.  Limit canned vegetables. If you do use them, rinse them well to decrease the sodium.  When eating at a restaurant, ask that your food be prepared with less salt, or  no salt if possible. WHAT FOODS CAN I EAT? Seek help from a dietitian for individual calorie needs. Grains Whole grain or whole wheat bread. Brown rice. Whole grain or whole wheat pasta. Quinoa, bulgur, and whole grain cereals. Low-sodium cereals. Corn or whole wheat flour tortillas. Whole grain cornbread. Whole grain crackers. Low-sodium crackers. Vegetables Fresh or frozen vegetables (raw, steamed, roasted, or grilled). Low-sodium or reduced-sodium tomato and vegetable juices. Low-sodium or reduced-sodium tomato sauce and paste. Low-sodium or reduced-sodium canned vegetables.  Fruits All fresh, canned (in natural juice), or frozen fruits. Meat and Other Protein Products Ground beef (85% or leaner), grass-fed beef, or beef trimmed of fat. Skinless chicken or [place]. Ground chicken or [place]. Pork trimmed of fat. All fish and seafood. Eggs. Dried beans, peas, or lentils. Unsalted nuts and seeds. Unsalted canned beans. Dairy Low-fat dairy products, such as skim or 1% milk, 2% or reduced-fat cheeses, low-fat ricotta or cottage cheese, or plain low-fat yogurt. Low-sodium or reduced-sodium cheeses. Fats and Oils Tub margarines without trans fats. Light or reduced-fat mayonnaise and salad dressings (reduced sodium). Avocado. Safflower, olive, or canola oils. Natural peanut or almond butter. Other Unsalted popcorn and pretzels. The items listed above may not be a complete list of recommended foods or beverages. Contact your dietitian for more options. WHAT FOODS ARE NOT RECOMMENDED? Grains White bread. White pasta. White rice. Refined cornbread. Bagels and croissants. Crackers that contain trans fat. Vegetables Creamed or fried vegetables. Vegetables in a cheese sauce. Regular canned vegetables. Regular canned tomato sauce and paste. Regular tomato and vegetable juices. Fruits Dried fruits. Canned fruit in light or heavy syrup. Fruit juice. Meat and Other Protein Products Fatty cuts of meat.  Ribs, chicken wings, bacon, sausage, bologna, salami, chitterlings, fatback, hot dogs, bratwurst, and packaged luncheon meats. Salted nuts and seeds. Canned beans with salt. Dairy Whole or 2% milk, cream, half-and-half, and cream cheese. Whole-fat or sweetened yogurt. Full-fat cheeses or blue cheese. Nondairy creamers and whipped toppings. Processed cheese, cheese spreads, or cheese curds. Condiments Onion and garlic salt, seasoned salt, table salt, and sea salt. Canned and packaged gravies. Worcestershire sauce. Tartar sauce. Barbecue sauce. Teriyaki sauce. Soy sauce, including reduced sodium. Steak sauce. Fish sauce. Oyster sauce. Cocktail sauce. Horseradish. Ketchup and mustard. Meat flavorings and tenderizers. Bouillon cubes. Hot sauce. Tabasco sauce. Marinades. Taco seasonings. Relishes. Fats and Oils Butter, stick margarine, lard, shortening, ghee, and bacon fat. Coconut, palm kernel, or palm oils. Regular salad dressings. Other Pickles and olives. Salted popcorn and pretzels. The items listed above may not be a complete list of foods and beverages to avoid. Contact your dietitian for more information. WHERE CAN I FIND MORE INFORMATION? National Heart, Lung, and Blood Institute: [employer] Document Released: 11/23/2011 Document Revised: 04/20/2014 Document Reviewed: 10/08/2013 [facility] Patient Information 2015 [place], [place]. This information is not intended to replace advice given to you by your health care provider. Make sure you discuss any questions you have with your health care provider. Hypertension  Hypertension, commonly called high blood pressure, is when the force of blood pumping through your arteries is too strong. Your arteries are the blood vessels that carry blood from your heart throughout your body. A blood pressure reading consists of a higher number over a lower number, such as 110/72. The higher number (systolic) is the pressure  inside your arteries when your heart pumps. The lower number (diastolic) is the pressure inside your arteries when your heart relaxes. Ideally you want your blood pressure below 120/80. Hypertension forces your heart to work harder to pump blood. Your arteries may become narrow or stiff. Having hypertension puts you at risk for heart disease, stroke, and other problems.  RISK FACTORS Some risk factors for high blood pressure are controllable. Others are not.  Risk factors you cannot control include:   Race. You may be at higher risk if you are African American.  Age. Risk increases with age.  Gender. Men are at higher risk than women before age 95 years. After age 62, women are at higher risk than men. Risk factors you can control include:  Not getting enough exercise or physical activity.  Being overweight.  Getting too much fat, sugar, calories, or salt in your diet.  Drinking too much alcohol. SIGNS AND SYMPTOMS Hypertension does not usually cause signs or symptoms. Extremely high blood pressure (hypertensive crisis) may cause headache, anxiety, shortness of breath, and nosebleed. DIAGNOSIS  To check if you have hypertension, your health care provider will measure your blood pressure while you are seated, with your arm held at the level of your heart. It should be measured at least twice using the same arm. Certain conditions can cause a difference in blood pressure between your right and left arms. A blood pressure reading that is higher than normal on one occasion does not mean that you need treatment. If one blood pressure reading is high, ask your health care provider about having it checked again. TREATMENT  Treating high blood pressure includes making lifestyle changes and possibly taking medicine. Living a healthy lifestyle can help lower high blood pressure. You may need to change some of your habits. Lifestyle changes may include:  Following the DASH diet. This diet is high in  fruits, vegetables, and whole grains. It is low in salt, red meat, and added sugars.  Getting at least 2 hours of brisk physical activity every week.  Losing weight if necessary.  Not smoking.  Limiting alcoholic beverages.  Learning ways to reduce stress. If lifestyle changes are not enough to get your blood pressure under control, your health care provider may prescribe medicine. You may need to take more than one. Work closely with your health care provider to understand the risks and benefits. HOME CARE INSTRUCTIONS  Have your blood pressure rechecked as directed by your health care provider.   Take medicines only as directed by your health care provider. Follow the directions carefully. Blood pressure medicines must be taken as prescribed. The medicine does not work as well when you skip doses. Skipping doses also puts you at risk for problems.   Do not smoke.   Monitor your blood pressure at home as directed by your health care provider. SEEK MEDICAL CARE IF:   You think you are having a reaction to medicines taken.  You have recurrent headaches or feel dizzy.  You have swelling in your ankles.  You have trouble with your vision. SEEK IMMEDIATE MEDICAL CARE IF:  You develop a severe headache or confusion.  You have unusual weakness, numbness, or feel faint.  You have severe chest or abdominal pain.  You vomit repeatedly.  You have trouble breathing. MAKE SURE YOU:   Understand these instructions.  Will watch your condition.  Will get help right away if you are not doing well or get worse. Document Released: 12/04/2005 Document Revised: 04/20/2014 Document Reviewed: 09/26/2013 [facility] Patient Information 2015 [place], [place]. This information is not intended to replace advice given to you by your health care provider. Make sure you discuss any questions you have with your health care provider.

## 2015-09-14 LAB — HEMOGLOBIN A1C
HEMOGLOBIN A1C: 5.9 % — AB (ref <5.7)
MEAN PLASMA GLUCOSE: 123 mg/dL — AB (ref <117)

## 2015-09-14 LAB — SEDIMENTATION RATE: Sed Rate: 4 mm/hr (ref 0–30)

## 2015-10-13 ENCOUNTER — Ambulatory Visit (INDEPENDENT_AMBULATORY_CARE_PROVIDER_SITE_OTHER): Payer: Self-pay | Admitting: Family Medicine

## 2015-10-13 VITALS — BP 130/51 | HR 72 | Temp 98.0°F | Resp 16 | Ht 59.0 in | Wt 156.0 lb

## 2015-10-13 DIAGNOSIS — I10 Essential (primary) hypertension: Secondary | ICD-10-CM

## 2015-10-13 DIAGNOSIS — Z720 Tobacco use: Secondary | ICD-10-CM

## 2015-10-13 DIAGNOSIS — R7303 Prediabetes: Secondary | ICD-10-CM

## 2015-10-13 DIAGNOSIS — G629 Polyneuropathy, unspecified: Secondary | ICD-10-CM

## 2015-10-13 LAB — POCT URINALYSIS DIP (DEVICE)
BILIRUBIN URINE: NEGATIVE
Glucose, UA: NEGATIVE mg/dL
Ketones, ur: NEGATIVE mg/dL
NITRITE: NEGATIVE
Protein, ur: NEGATIVE mg/dL
Specific Gravity, Urine: 1.025 (ref 1.005–1.030)
Urobilinogen, UA: 0.2 mg/dL (ref 0.0–1.0)
pH: 6 (ref 5.0–8.0)

## 2015-10-13 MED ORDER — GABAPENTIN 300 MG PO CAPS: 300.0000 mg | ORAL_CAPSULE | Freq: Three times a day (TID) | ORAL | Status: DC

## 2015-10-13 NOTE — Progress Notes (Signed)
Subjective:    Patient ID: Renee Murray, female    DOB: January 18, 1956, 59 y.o.   MRN: [id number]  HPI [name], a 59 year old female with a history of a brain aneurysm, hypertension, and neuropathy presents for a follow up of neuropathy. Renee Murray has a history of hypertension. She states that blood pressure has been controlled with Norvasc 5 mg daily.  She is not exercising and is adherent to low salt diet. She does not check blood pressures at home.  Patient denies chest pain, dyspnea, fatigue, lower extremity edema, near-syncope, palpitations and tachypnea.  Cardiovascular risk factors include: hypertension, sedentary lifestyle and smoking/ tobacco exposure.   Patient reports a history of neuropathy to right upper arm. She state that pain has been present for greater than 1 year. She reports that neuropathy has improved since starting gabapentin. She describes pain as burning, shooting, and intermittenty. She reports numbness and tingling to extremities. She maintains that pain is often worsened at night. Gabapentin was last taken around 7 am, with moderate relief. Pain intensity is currently 2/10.    Past Medical History  Diagnosis Date  . Hypertension   . Brain aneurysm    Social History   Social History  . Marital Status: Divorced    Spouse Name: N/A  . Number of Children: N/A  . Years of Education: N/A   Occupational History  . Not on file.   Social History Main Topics  . Smoking status: Current Every Day Smoker -- 0.50 packs/day for 20 years  . Smokeless tobacco: Not on file  . Alcohol Use: Yes     Comment: socially  . Drug Use: No  . Sexual Activity: Not on file   Other Topics Concern  . Not on file   Social History Narrative   Immunization History  Administered Date(s) Administered  . Influenza,inj,Quad PF,36+ Mos 09/13/2015   Allergies  Allergen Reactions  . Demerol Other (See Comments)    "almost died"  . Morphine And Related Other (See Comments)     "Almost Died"   Review of Systems  Constitutional: Negative for fever, fatigue and unexpected weight change.  HENT: Negative for tinnitus.   Eyes: Negative for visual disturbance.  Respiratory: Negative for chest tightness.   Cardiovascular: Negative for chest pain and palpitations.  Gastrointestinal: Negative for nausea, diarrhea and constipation.  Endocrine: Negative for cold intolerance, heat intolerance, polydipsia, polyphagia and polyuria.  Genitourinary: Negative.   Musculoskeletal: Positive for myalgias.  Skin: Negative.   Neurological: Positive for numbness (numbness and tingling to right upper extremity). Negative for dizziness, tremors, facial asymmetry, weakness and headaches.  Psychiatric/Behavioral: Negative for suicidal ideas and agitation. The patient is not nervous/anxious.        Objective:   Physical Exam  Constitutional: She appears well-developed and well-nourished.  HENT:  Head: Normocephalic and atraumatic.  Right Ear: External ear normal.  Left Ear: External ear normal.  Mouth/Throat: Oropharynx is clear and moist.  Eyes: Conjunctivae and EOM are normal. Pupils are equal, round, and reactive to light.  Neck: Trachea normal and normal range of motion.  Cardiovascular: Normal rate, regular rhythm, normal heart sounds and normal pulses.   Pulmonary/Chest: Effort normal and breath sounds normal.  Abdominal: Soft. Normal appearance and bowel sounds are normal.  Musculoskeletal:       Left shoulder: She exhibits normal range of motion and no tenderness.       Right wrist: She exhibits decreased range of motion and tenderness.  Lymphadenopathy:       Head (right side): No submental and no submandibular adenopathy present.       Head (left side): No submental and no submandibular adenopathy present.  Neurological: She has normal strength. No cranial nerve deficit or sensory deficit.  Psychiatric: She has a normal mood and affect. Her speech is normal and  behavior is normal. Judgment and thought content normal. Cognition and memory are normal. She expresses no homicidal and no suicidal ideation.      BP 130/51 mmHg  Pulse 72  Temp(Src) 98 F (36.7 C) (Oral)  Resp 16  Ht 4\' 11"  (1.499 m)  Wt 156 lb (70.761 kg)  BMI 31.49 kg/m2  SpO2 100% Assessment & Plan:   1. Hypertension, goal below 150/90 Blood pressure is at goal on current medication regimen. Will continue medications as previously prescribed. Reviewed urinalysis, no proteinuria present. The patient is asked to make an attempt to improve diet and exercise patterns to aid in medical management of this problem.  2. Neuropathy (HCC) She reports that gabapentin is improving symptoms of neuropathy minimally. Will increase gabapentin to 300 mg TID. Will follow-up with Renee Murray in 1 month.  - gabapentin (NEURONTIN) 300 MG capsule; Take 1 capsule (300 mg total) by mouth 3 (three) times daily.  Dispense: 90 capsule; Refill: 0  3. Prediabetes Discussed hemoglobin A1C results at length. Recommend a lowfat, low carbohydrate diet divided over 5-6 small meals, increase water intake to 6-8 glasses, and exercise 30 minutes 3-4 times per week.   4. Tobacco abuse Smoking cessation instruction/counseling given:  counseled patient on the dangers of tobacco use, advised patient to stop smoking, and reviewed strategies to maximize success  RTC: 1 month [name], FNP   The patient was given clear instructions to go to ER or return to medical center if symptoms do not improve, worsen or new problems develop. The patient verbalized understanding. Will notify patient with laboratory results.

## 2015-10-14 ENCOUNTER — Encounter: Payer: Self-pay | Admitting: Family Medicine

## 2015-11-09 ENCOUNTER — Ambulatory Visit (INDEPENDENT_AMBULATORY_CARE_PROVIDER_SITE_OTHER): Payer: Worker's Compensation | Admitting: Family Medicine

## 2015-11-09 ENCOUNTER — Encounter: Payer: Self-pay | Admitting: Family Medicine

## 2015-11-09 ENCOUNTER — Encounter: Payer: Self-pay | Admitting: Student

## 2015-11-09 VITALS — BP 131/62 | HR 68 | Temp 98.2°F | Resp 16 | Ht 59.0 in | Wt 157.0 lb

## 2015-11-09 DIAGNOSIS — I10 Essential (primary) hypertension: Secondary | ICD-10-CM | POA: Diagnosis not present

## 2015-11-09 DIAGNOSIS — Z720 Tobacco use: Secondary | ICD-10-CM | POA: Diagnosis not present

## 2015-11-09 DIAGNOSIS — G629 Polyneuropathy, unspecified: Secondary | ICD-10-CM

## 2015-11-09 MED ORDER — GABAPENTIN 300 MG PO CAPS: 300.0000 mg | ORAL_CAPSULE | Freq: Three times a day (TID) | ORAL | Status: DC

## 2015-11-09 NOTE — Patient Instructions (Addendum)
[facility] Sport Care Carpal Tunnel Wrist Stabilizer, Maximum Support, Model 91478 Small/Medium at [employer].  Neuropathic Pain Neuropathic pain is pain caused by damage to the nerves that are responsible for certain sensations in your body (sensory nerves). The pain can be caused by damage to:   The sensory nerves that send signals to your spinal cord and brain (peripheral nervous system).  The sensory nerves in your brain or spinal cord (central nervous system). Neuropathic pain can make you more sensitive to pain. What would be a minor sensation for most people may feel very painful if you have neuropathic pain. This is usually a long-term condition that can be difficult to treat. The type of pain can differ from person to person. It may start suddenly (acute), or it may develop slowly and last for a long time (chronic). Neuropathic pain may come and go as damaged nerves heal or may stay at the same level for years. It often causes emotional distress, loss of sleep, and a lower quality of life. CAUSES  The most common cause of damage to a sensory nerve is diabetes. Many other diseases and conditions can also cause neuropathic pain. Causes of neuropathic pain can be classified as:  Toxic. Many drugs and chemicals can cause toxic damage. The most common cause of toxic neuropathic pain is damage from drug treatment for cancer (chemotherapy).  Metabolic. This type of pain can happen when a disease causes imbalances that damage nerves. Diabetes is the most common of these diseases. Vitamin B deficiency caused by long-term alcohol abuse is another common cause.  Traumatic. Any injury that cuts, crushes, or stretches a nerve can cause damage and pain. A common example is feeling pain after losing an arm or leg (phantom limb pain).  Compression-related. If a sensory nerve gets trapped or compressed for a long period of time, the blood supply to the nerve can be cut off.  Vascular. Many blood vessel  diseases can cause neuropathic pain by decreasing blood supply and oxygen to nerves.  Autoimmune. This type of pain results from diseases in which the body's defense system mistakenly attacks sensory nerves. Examples of autoimmune diseases that can cause neuropathic pain include lupus and multiple sclerosis.  Infectious. Many types of viral infections can damage sensory nerves and cause pain. Shingles infection is a common cause of this type of pain.  Inherited. Neuropathic pain can be a symptom of many diseases that are passed down through families (genetic). SIGNS AND SYMPTOMS  The main symptom is pain. Neuropathic pain is often described as:  Burning.  Shock-like.  Stinging.  Hot or cold.  Itching. DIAGNOSIS  No single test can diagnose neuropathic pain. Your health care provider will do a physical exam and ask you about your pain. You may use a pain scale to describe how bad your pain is. You may also have tests to see if you have a high sensitivity to pain and to help find the cause and location of any sensory nerve damage. These tests may include:  Imaging studies, such as:  X-rays.  CT scan.  MRI.  Nerve conduction studies to test how well nerve signals travel through your sensory nerves (electrodiagnostic testing).  Stimulating your sensory nerves through electrodes on your skin and measuring the response in your spinal cord and brain (somatosensory evoked potentials). TREATMENT  Treatment for neuropathic pain may change over time. You may need to try different treatment options or a combination of treatments. Some options include:  Over-the-counter pain relievers.  Prescription medicines. Some medicines used to treat other conditions may also help neuropathic pain. These include medicines to:  Control seizures (anticonvulsants).  Relieve depression (antidepressants).  Prescription-strength pain relievers (narcotics). These are usually used when other pain  relievers do not help.  Transcutaneous nerve stimulation (TENS). This uses electrical currents to block painful nerve signals. The treatment is painless.  Topical and local anesthetics. These are medicines that numb the nerves. They can be injected as a nerve block or applied to the skin.  Alternative treatments, such as:  Acupuncture.  Meditation.  Massage.  Physical therapy.  Pain management programs.  Counseling. HOME CARE INSTRUCTIONS  Learn as much as you can about your condition.  Take medicines only as directed by your health care provider.  Work closely with all your health care providers to find what works best for you.  Have a good support system at home.  Consider joining a chronic pain support group. SEEK MEDICAL CARE IF:  Your pain treatments are not helping.  You are having side effects from your medicines.  You are struggling with fatigue, mood changes, depression, or anxiety.   This information is not intended to replace advice given to you by your health care provider. Make sure you discuss any questions you have with your health care provider.   Document Released: 08/31/2004 Document Revised: 12/25/2014 Document Reviewed: 05/14/2014 Elsevier Interactive Patient Education [employer]2016 Elsevier Inc.

## 2015-11-09 NOTE — Progress Notes (Signed)
Subjective:    Patient ID: Renee Murray, female    DOB: 07/17/1956, 59 y.o.   MRN: [id number]  HPI Renee Murray, a 59 year old female with a history of a brain aneurysm, hypertension, and neuropathy presents for a follow up of neuropathy. Patient reports a history of neuropathy to right upper arm. She state that pain has been present for greater than 1 year. She reports that neuropathy has improved since starting gabapentin, but is present primarily at night.  She describes pain as burning, shooting, and intermittenty. She reports numbness and tingling to extremities. She maintains that pain is often worsened at night. Gabapentin was last taken around 7 am, with moderate relief. Pain intensity is currently 2/10.    Past Medical History  Diagnosis Date  . Hypertension   . Brain aneurysm    Social History   Social History  . Marital Status: Divorced    Spouse Name: N/A  . Number of Children: N/A  . Years of Education: N/A   Occupational History  . Not on file.   Social History Main Topics  . Smoking status: Current Every Day Smoker -- 0.50 packs/day for 20 years  . Smokeless tobacco: Not on file  . Alcohol Use: Yes     Comment: socially  . Drug Use: No  . Sexual Activity: Not on file   Other Topics Concern  . Not on file   Social History Narrative   Immunization History  Administered Date(s) Administered  . Influenza,inj,Quad PF,36+ Mos 09/13/2015   Allergies  Allergen Reactions  . Demerol Other (See Comments)    "almost died"  . Morphine And Related Other (See Comments)    "Almost Died"   Review of Systems  Constitutional: Negative for fever, fatigue and unexpected weight change.  HENT: Negative for tinnitus.   Eyes: Negative for visual disturbance.  Respiratory: Negative for chest tightness.   Cardiovascular: Negative for chest pain and palpitations.  Gastrointestinal: Negative for nausea, diarrhea and constipation.  Endocrine: Negative for cold  intolerance, heat intolerance, polydipsia, polyphagia and polyuria.  Genitourinary: Negative.   Musculoskeletal: Positive for myalgias.  Skin: Negative.   Neurological: Positive for numbness (numbness and tingling to right upper extremity). Negative for dizziness, tremors, facial asymmetry, weakness and headaches.  Psychiatric/Behavioral: Negative for suicidal ideas and agitation. The patient is not nervous/anxious.        Objective:   Physical Exam  Constitutional: She appears well-developed and well-nourished.  HENT:  Head: Normocephalic and atraumatic.  Right Ear: External ear normal.  Left Ear: External ear normal.  Mouth/Throat: Oropharynx is clear and moist.  Eyes: Conjunctivae and EOM are normal. Pupils are equal, round, and reactive to light.  Neck: Trachea normal and normal range of motion.  Cardiovascular: Normal rate, regular rhythm, normal heart sounds and normal pulses.   Pulmonary/Chest: Effort normal and breath sounds normal.  Abdominal: Soft. Normal appearance and bowel sounds are normal.  Musculoskeletal:       Left shoulder: She exhibits normal range of motion and no tenderness.       Right wrist: She exhibits decreased range of motion and tenderness.  Lymphadenopathy:       Head (right side): No submental and no submandibular adenopathy present.       Head (left side): No submental and no submandibular adenopathy present.  Neurological: She has normal strength. No cranial nerve deficit or sensory deficit.  Psychiatric: She has a normal mood and affect. Her speech is normal and behavior  is normal. Judgment and thought content normal. Cognition and memory are normal. She expresses no homicidal and no suicidal ideation.      BP 131/62 mmHg  Pulse 68  Temp(Src) 98.2 F (36.8 C) (Oral)  Resp 16  Ht  (1.499 m)  Wt 157 lb (71.215 kg)  BMI 31.69 kg/m2 Assessment & Plan:  1. Neuropathy (HCC) Pt reports that she has not been taking gabapentin consistently as  prescribed. Will continue at current dosage and follow up in 1 month.  - gabapentin (NEURONTIN) 300 MG capsule; Take 1 capsule (300 mg total) by mouth 3 (three) times daily.  Dispense: 90 capsule; Refill: 0   2. Hypertension, goal below 150/90 Blood pressure is at goal on current medication regimen. Will continue medications as previously prescribed. Reviewed urinalysis, no proteinuria present. The patient is asked to make an attempt to improve diet and exercise patterns to aid in medical management of this problem.   3. Tobacco abuse Smoking cessation instruction/counseling given:  counseled patient on the dangers of tobacco use, advised patient to stop smoking, and reviewed strategies to maximize success  RTC: 1 month Dary Dilauro M, FNP   The patient was given clear instructions to go to ER or return to medical center if symptoms do not improve, worsen or new problems develop. The patient verbalized understanding. Will notify patient with laboratory results.

## 2015-11-10 ENCOUNTER — Ambulatory Visit: Payer: Self-pay | Admitting: Family Medicine

## 2015-12-07 ENCOUNTER — Ambulatory Visit: Payer: Self-pay | Admitting: Family Medicine

## 2015-12-29 ENCOUNTER — Other Ambulatory Visit: Payer: Self-pay | Admitting: Family Medicine

## 2015-12-29 MED FILL — AMLODIPINE BESYLATE 5 MG TA: 5 | 30 days supply | Qty: 30 | Fill #2

## 2015-12-29 MED FILL — GABAPENTIN 300 MG CAPSULE: 300 | 30 days supply | Qty: 90 | Fill #0

## 2016-01-12 ENCOUNTER — Ambulatory Visit: Payer: Self-pay

## 2016-01-25 ENCOUNTER — Ambulatory Visit (INDEPENDENT_AMBULATORY_CARE_PROVIDER_SITE_OTHER): Payer: Self-pay | Admitting: Internal Medicine

## 2016-01-25 ENCOUNTER — Encounter: Payer: Self-pay | Admitting: Internal Medicine

## 2016-01-25 VITALS — BP 125/65 | HR 70 | Temp 98.1°F | Resp 18 | Ht 59.0 in | Wt 158.0 lb

## 2016-01-25 DIAGNOSIS — G894 Chronic pain syndrome: Secondary | ICD-10-CM

## 2016-01-25 DIAGNOSIS — I10 Essential (primary) hypertension: Secondary | ICD-10-CM

## 2016-01-25 DIAGNOSIS — G629 Polyneuropathy, unspecified: Secondary | ICD-10-CM

## 2016-01-25 DIAGNOSIS — Z720 Tobacco use: Secondary | ICD-10-CM

## 2016-01-25 MED ORDER — ACETAMINOPHEN-CODEINE #3 300-30 MG PO TABS: 1.0000 | ORAL_TABLET | Freq: Three times a day (TID) | ORAL | Status: DC | PRN

## 2016-01-25 MED ORDER — AMLODIPINE BESYLATE 5 MG PO TABS: 5.0000 mg | ORAL_TABLET | Freq: Every day | ORAL | Status: DC

## 2016-01-25 MED FILL — AMLODIPINE BESYLATE 5 MG TA: 5 | 30 days supply | Qty: 30 | Fill #3

## 2016-01-25 MED FILL — ACETAMINOPHEN/COD #3 TABLET: 300-30 | 20 days supply | Qty: 60 | Fill #0

## 2016-01-25 NOTE — Progress Notes (Signed)
Patient ID: Renee Murray, female   DOB: November 13, 1956, 60 y.o.   MRN: [id number]   Elenora Hawbaker, is a 60 y.o. female  [id number]  [id number]  DOB - July 27, 1956  Chief Complaint  Patient presents with  . Follow-up         Subjective:   Renee Murray is a 60 y.o. female here today for a follow up visit. Patient has history of brain aneurism, hypertension and neuropathy. Patient presents with complaints of continuing pain in right lower back w/ sciatica and right arm and wrist pain. Patient describes pain as numbness and tingling 4 of 10 and occurs sporadically, worse at night.  Patient has brace for right arm that she wears periodically; but states "she gets aggravated with it and takes it off". Patient has moderate relief with ibuprofen 400 mg 2 to 3 times per day. Patient was seen in this office and started on gabapentin in January, 2017 states she has "some relief since that time and wants to continue". Patient currently on complete disability since aneurism. Patient is being seen by Dr Conchita Paris for neurology concerns. Patient has not had recent mammogram or colonoscopy but wants to wait until her new insurance is in effect 02/2016.  Patient continues to smoke cigarettes but states she is trying to quit. Patient has No headache, No chest pain, No abdominal pain - No Nausea, No new weakness tingling or numbness, No Cough - SOB.  Problem  Essential Hypertension  Chronic Pain Syndrome    ALLERGIES: Allergies  Allergen Reactions  . Demerol Other (See Comments)    "almost died"  . Morphine And Related Other (See Comments)    "Almost Died"    PAST MEDICAL HISTORY: Past Medical History  Diagnosis Date  . Hypertension   . Brain aneurysm     MEDICATIONS AT HOME: Prior to Admission medications   Medication Sig Start Date End Date Taking? Authorizing Provider  acetaminophen (TYLENOL) 500 MG tablet Take 1,000 mg by mouth every 6 (six) hours as needed for moderate pain.   Yes  Historical Provider, MD  amLODipine (NORVASC) 5 MG tablet Take 1 tablet (5 mg total) by mouth daily. 01/25/16  Yes Quentin Angst, MD  fluticasone (FLONASE) 50 MCG/ACT nasal spray Place 2 sprays into the nose daily. Patient taking differently: Place 2 sprays into the nose daily as needed for allergies.  09/27/13  Yes Servando Salina, NP  gabapentin (NEURONTIN) 300 MG capsule TAKE 1 CAPSULE BY MOUTH 3 TIMES DAILY. 12/29/15  Yes Massie Maroon, FNP  acetaminophen-codeine (TYLENOL #3) 300-30 MG tablet Take 1 tablet by mouth every 8 (eight) hours as needed for moderate pain. 01/25/16   Quentin Angst, MD  loratadine (CLARITIN) 10 MG tablet Take 10 mg by mouth daily. Reported on 01/25/2016    Historical Provider, MD     Objective:   Filed Vitals:   01/25/16 1023  BP: 125/65  Pulse: 70  Temp: 98.1 F (36.7 C)  TempSrc: Oral  Resp: 18  Height:  (1.499 m)  Weight: 158 lb (71.668 kg)  SpO2: 97%    Exam General appearance : Awake, alert, not in any distress. Speech Clear. Not toxic looking HEENT: Atraumatic and Normocephalic, pupils equally reactive to light and accomodation Neck: supple, no JVD. No cervical lymphadenopathy.  Chest:Good air entry bilaterally, no added sounds  CVS: S1 S2 regular, no murmurs.  Abdomen: Bowel sounds present, Non tender and not distended with no gaurding, rigidity or rebound. Extremities: B/L Lower  Ext shows no edema, both legs are warm to touch Neurology: Awake alert, and oriented X 3, CN II-XII intact, Non focal Skin:No Rash  Data Review Lab Results  Component Value Date   HGBA1C 5.9* 09/13/2015   HGBA1C 5.8* 03/17/2014     Assessment & Plan   1. Neuropathy (HCC)  Continue gabapentin  2. Tobacco abuse  Ketura was counseled on the dangers of tobacco use, and was advised to quit. Reviewed strategies to maximize success, including removing cigarettes and smoking materials from environment, stress management and support of  family/friends.   3. Essential hypertension  - amLODipine (NORVASC) 5 MG tablet; Take 1 tablet (5 mg total) by mouth daily.  Dispense: 30 tablet; Refill: 5  We have discussed target BP range and blood pressure goal. I have advised patient to check BP regularly and to call us back or report to clinic if the numbers are consistently higher than 140/90. We discussed the importance of compliance with medical therapy and DASH diet recommended, consequences of uncontrolled hypertension discussed.   - continue current BP medications  4. Chronic pain syndrome Trial of - acetaminophen-codeine (TYLENOL #3) 300-30 MG tablet; Take 1 tablet by mouth every 8 (eight) hours as needed for moderate pain.  Dispense: 60 tablet; Refill: 0 Continue Gabapentin 100 mg tablet by mouth, TID   Patient have been counseled extensively about nutrition and exercise  Return in about 3 months (around 04/23/2016) for Follow up HTN, Follow up Pain and comorbidities.  The patient was given clear instructions to go to ER or return to medical center if symptoms don't improve, worsen or new problems develop. The patient verbalized understanding. The patient was told to call to get lab results if they haven't heard anything in the next week.    Stephanie Coup - AGNP-Student  This note has been created with Dragon speech recognition software and Paediatric nurse. Any transcriptional errors are unintentional.    Jeanann Lewandowsky, MD, MHA, Maxwell Caul, CPE [facility] [place], [place] [phone]   01/25/2016, 11:39 AM

## 2016-01-25 NOTE — Patient Instructions (Signed)
Chronic Pain Chronic pain can be defined as pain that is off and on and lasts for 3-6 months or longer. Many things cause chronic pain, which can make it difficult to make a diagnosis. There are many treatment options available for chronic pain. However, finding a treatment that works well for you may require trying various approaches until the right one is found. Many people benefit from a combination of two or more types of treatment to control their pain. SYMPTOMS  Chronic pain can occur anywhere in the body and can range from mild to very severe. Some types of chronic pain include:  Headache.  Low back pain.  Cancer pain.  Arthritis pain.  Neurogenic pain. This is pain resulting from damage to nerves. People with chronic pain may also have other symptoms such as:  Depression.  Anger.  Insomnia.  Anxiety. DIAGNOSIS  Your health care provider will help diagnose your condition over time. In many cases, the initial focus will be on excluding possible conditions that could be causing the pain. Depending on your symptoms, your health care provider may order tests to diagnose your condition. Some of these tests may include:   Blood tests.   CT scan.   MRI.   X-rays.   Ultrasounds.   Nerve conduction studies.  You may need to see a specialist.  TREATMENT  Finding treatment that works well may take time. You may be referred to a pain specialist. He or she may prescribe medicine or therapies, such as:   Mindful meditation or yoga.  Shots (injections) of numbing or pain-relieving medicines into the spine or area of pain.  Local electrical stimulation.  Acupuncture.   Massage therapy.   Aroma, color, light, or sound therapy.   Biofeedback.   Working with a physical therapist to keep from getting stiff.   Regular, gentle exercise.   Cognitive or behavioral therapy.   Group support.  Sometimes, surgery may be recommended.  HOME CARE INSTRUCTIONS    Take all medicines as directed by your health care provider.   Lessen stress in your life by relaxing and doing things such as listening to calming music.   Exercise or be active as directed by your health care provider.   Eat a healthy diet and include things such as vegetables, fruits, fish, and lean meats in your diet.   Keep all follow-up appointments with your health care provider.   Attend a support group with others suffering from chronic pain. SEEK MEDICAL CARE IF:   Your pain gets worse.   You develop a new pain that was not there before.   You cannot tolerate medicines given to you by your health care provider.   You have new symptoms since your last visit with your health care provider.  SEEK IMMEDIATE MEDICAL CARE IF:   You feel weak.   You have decreased sensation or numbness.   You lose control of bowel or bladder function.   Your pain suddenly gets much worse.   You develop shaking.  You develop chills.  You develop confusion.  You develop chest pain.  You develop shortness of breath.  MAKE SURE YOU:  Understand these instructions.  Will watch your condition.  Will get help right away if you are not doing well or get worse.   This information is not intended to replace advice given to you by your health care provider. Make sure you discuss any questions you have with your health care provider.   Document Released: 08/26/2002  Document Revised: 08/06/2013 Document Reviewed: 05/30/2013 Elsevier Interactive Patient Education 2016 [employer] Inc[employer] DASH Eating Plan DASH stands for "Dietary Approaches to Stop Hypertension." The DASH eating plan is a healthy eating plan that has been shown to reduce high blood pressure (hypertension). Additional health benefits may include reducing the risk of type 2 diabetes mellitus, heart disease, and stroke. The DASH eating plan may also help with weight loss. WHAT DO I NEED TO KNOW ABOUT THE DASH EATING  PLAN? For the DASH eating plan, you will follow these general guidelines:  Choose foods with a percent daily value for sodium of less than 5% (as listed on the food label).  Use salt-free seasonings or herbs instead of table salt or sea salt.  Check with your health care provider or pharmacist before using salt substitutes.  Eat lower-sodium products, often labeled as "lower sodium" or "no salt added."  Eat fresh foods.  Eat more vegetables, fruits, and low-fat dairy products.  Choose whole grains. Look for the word "whole" as the first word in the ingredient list.  Choose fish and skinless chicken or [place] more often than red meat. Limit fish, poultry, and meat to 6 oz (170 g) each day.  Limit sweets, desserts, sugars, and sugary drinks.  Choose heart-healthy fats.  Limit cheese to 1 oz (28 g) per day.  Eat more home-cooked food and less restaurant, buffet, and fast food.  Limit fried foods.  Cook foods using methods other than frying.  Limit canned vegetables. If you do use them, rinse them well to decrease the sodium.  When eating at a restaurant, ask that your food be prepared with less salt, or no salt if possible. WHAT FOODS CAN I EAT? Seek help from a dietitian for individual calorie needs. Grains Whole grain or whole wheat bread. Brown rice. Whole grain or whole wheat pasta. Quinoa, bulgur, and whole grain cereals. Low-sodium cereals. Corn or whole wheat flour tortillas. Whole grain cornbread. Whole grain crackers. Low-sodium crackers. Vegetables Fresh or frozen vegetables (raw, steamed, roasted, or grilled). Low-sodium or reduced-sodium tomato and vegetable juices. Low-sodium or reduced-sodium tomato sauce and paste. Low-sodium or reduced-sodium canned vegetables.  Fruits All fresh, canned (in natural juice), or frozen fruits. Meat and Other Protein Products Ground beef (85% or leaner), grass-fed beef, or beef trimmed of fat. Skinless chicken or [place]. Ground  chicken or [place]. Pork trimmed of fat. All fish and seafood. Eggs. Dried beans, peas, or lentils. Unsalted nuts and seeds. Unsalted canned beans. Dairy Low-fat dairy products, such as skim or 1% milk, 2% or reduced-fat cheeses, low-fat ricotta or cottage cheese, or plain low-fat yogurt. Low-sodium or reduced-sodium cheeses. Fats and Oils Tub margarines without trans fats. Light or reduced-fat mayonnaise and salad dressings (reduced sodium). Avocado. Safflower, olive, or canola oils. Natural peanut or almond butter. Other Unsalted popcorn and pretzels. The items listed above may not be a complete list of recommended foods or beverages. Contact your dietitian for more options. WHAT FOODS ARE NOT RECOMMENDED? Grains White bread. White pasta. White rice. Refined cornbread. Bagels and croissants. Crackers that contain trans fat. Vegetables Creamed or fried vegetables. Vegetables in a cheese sauce. Regular canned vegetables. Regular canned tomato sauce and paste. Regular tomato and vegetable juices. Fruits Dried fruits. Canned fruit in light or heavy syrup. Fruit juice. Meat and Other Protein Products Fatty cuts of meat. Ribs, chicken wings, bacon, sausage, bologna, salami, chitterlings, fatback, hot dogs, bratwurst, and packaged luncheon meats. Salted nuts and seeds. Canned beans with salt. Dairy  Whole or 2% milk, cream, half-and-half, and cream cheese. Whole-fat or sweetened yogurt. Full-fat cheeses or blue cheese. Nondairy creamers and whipped toppings. Processed cheese, cheese spreads, or cheese curds. Condiments Onion and garlic salt, seasoned salt, table salt, and sea salt. Canned and packaged gravies. Worcestershire sauce. Tartar sauce. Barbecue sauce. Teriyaki sauce. Soy sauce, including reduced sodium. Steak sauce. Fish sauce. Oyster sauce. Cocktail sauce. Horseradish. Ketchup and mustard. Meat flavorings and tenderizers. Bouillon cubes. Hot sauce. Tabasco sauce. Marinades. Taco seasonings.  Relishes. Fats and Oils Butter, stick margarine, lard, shortening, ghee, and bacon fat. Coconut, palm kernel, or palm oils. Regular salad dressings. Other Pickles and olives. Salted popcorn and pretzels. The items listed above may not be a complete list of foods and beverages to avoid. Contact your dietitian for more information. WHERE CAN I FIND MORE INFORMATION? National Heart, Lung, and Blood Institute: [employer]   This information is not intended to replace advice given to you by your health care provider. Make sure you discuss any questions you have with your health care provider.   Document Released: 11/23/2011 Document Revised: 12/25/2014 Document Reviewed: 10/08/2013 Elsevier Interactive Patient Education 2016 [employer]. Hypertension Hypertension, commonly called high blood pressure, is when the force of blood pumping through your arteries is too strong. Your arteries are the blood vessels that carry blood from your heart throughout your body. A blood pressure reading consists of a higher number over a lower number, such as 110/72. The higher number (systolic) is the pressure inside your arteries when your heart pumps. The lower number (diastolic) is the pressure inside your arteries when your heart relaxes. Ideally you want your blood pressure below 120/80. Hypertension forces your heart to work harder to pump blood. Your arteries may become narrow or stiff. Having untreated or uncontrolled hypertension can cause heart attack, stroke, kidney disease, and other problems. RISK FACTORS Some risk factors for high blood pressure are controllable. Others are not.  Risk factors you cannot control include:   Race. You may be at higher risk if you are African American.  Age. Risk increases with age.  Gender. Men are at higher risk than women before age 59 years. After age 50, women are at higher risk than men. Risk factors you can control  include:  Not getting enough exercise or physical activity.  Being overweight.  Getting too much fat, sugar, calories, or salt in your diet.  Drinking too much alcohol. SIGNS AND SYMPTOMS Hypertension does not usually cause signs or symptoms. Extremely high blood pressure (hypertensive crisis) may cause headache, anxiety, shortness of breath, and nosebleed. DIAGNOSIS To check if you have hypertension, your health care provider will measure your blood pressure while you are seated, with your arm held at the level of your heart. It should be measured at least twice using the same arm. Certain conditions can cause a difference in blood pressure between your right and left arms. A blood pressure reading that is higher than normal on one occasion does not mean that you need treatment. If it is not clear whether you have high blood pressure, you may be asked to return on a different day to have your blood pressure checked again. Or, you may be asked to monitor your blood pressure at home for 1 or more weeks. TREATMENT Treating high blood pressure includes making lifestyle changes and possibly taking medicine. Living a healthy lifestyle can help lower high blood pressure. You may need to change some of your habits. Lifestyle changes may include:  Following the DASH diet. This diet is high in fruits, vegetables, and whole grains. It is low in salt, red meat, and added sugars.  Keep your sodium intake below 2,300 mg per day.  Getting at least 30-45 minutes of aerobic exercise at least 4 times per week.  Losing weight if necessary.  Not smoking.  Limiting alcoholic beverages.  Learning ways to reduce stress. Your health care provider may prescribe medicine if lifestyle changes are not enough to get your blood pressure under control, and if one of the following is true:  You are 42-90 years of age and your systolic blood pressure is above 140.  You are 27 years of age or older, and your  systolic blood pressure is above 150.  Your diastolic blood pressure is above 90.  You have diabetes, and your systolic blood pressure is over 140 or your diastolic blood pressure is over 90.  You have kidney disease and your blood pressure is above 140/90.  You have heart disease and your blood pressure is above 140/90. Your personal target blood pressure may vary depending on your medical conditions, your age, and other factors. HOME CARE INSTRUCTIONS  Have your blood pressure rechecked as directed by your health care provider.   Take medicines only as directed by your health care provider. Follow the directions carefully. Blood pressure medicines must be taken as prescribed. The medicine does not work as well when you skip doses. Skipping doses also puts you at risk for problems.  Do not smoke.   Monitor your blood pressure at home as directed by your health care provider. SEEK MEDICAL CARE IF:   You think you are having a reaction to medicines taken.  You have recurrent headaches or feel dizzy.  You have swelling in your ankles.  You have trouble with your vision. SEEK IMMEDIATE MEDICAL CARE IF:  You develop a severe headache or confusion.  You have unusual weakness, numbness, or feel faint.  You have severe chest or abdominal pain.  You vomit repeatedly.  You have trouble breathing. MAKE SURE YOU:   Understand these instructions.  Will watch your condition.  Will get help right away if you are not doing well or get worse.   This information is not intended to replace advice given to you by your health care provider. Make sure you discuss any questions you have with your health care provider.   Document Released: 12/04/2005 Document Revised: 04/20/2015 Document Reviewed: 09/26/2013 Elsevier Interactive Patient Education [employer]

## 2016-01-25 NOTE — Progress Notes (Signed)
Patient is here for FU visit  Patient complains of pain being present in her right hand and her right hip and lower back. Pain is scaled currently at a 7. Discomfort in her arm is described as numbing and being massaged to relax the muscles.

## 2016-02-08 ENCOUNTER — Ambulatory Visit: Payer: Self-pay | Admitting: Internal Medicine

## 2016-02-17 ENCOUNTER — Other Ambulatory Visit (HOSPITAL_COMMUNITY): Payer: Self-pay | Admitting: Neurosurgery

## 2016-02-17 ENCOUNTER — Other Ambulatory Visit: Payer: Self-pay | Admitting: Neurosurgery

## 2016-02-17 DIAGNOSIS — I609 Nontraumatic subarachnoid hemorrhage, unspecified: Secondary | ICD-10-CM

## 2016-02-29 MED FILL — AMLODIPINE BESYLATE 5 MG TA: 5 | 30 days supply | Qty: 30 | Fill #4

## 2016-02-29 MED FILL — GABAPENTIN 300 MG CAPSULE: 300 | 30 days supply | Qty: 90 | Fill #1

## 2016-04-03 MED FILL — ?AMLODIPINE BESYLATE 5 MG T: 5 | 30 days supply | Qty: 30 | Fill #5

## 2016-04-04 ENCOUNTER — Ambulatory Visit (HOSPITAL_COMMUNITY)
Admission: RE | Admit: 2016-04-04 | Discharge: 2016-04-04 | Disposition: A | Discharge status: Home / Self Care | Payer: BLUE CROSS/BLUE SHIELD | Source: Ambulatory Visit | Attending: Neurosurgery | Admitting: Neurosurgery

## 2016-04-04 ENCOUNTER — Other Ambulatory Visit (HOSPITAL_COMMUNITY): Payer: Self-pay | Admitting: Neurosurgery

## 2016-04-04 DIAGNOSIS — I609 Nontraumatic subarachnoid hemorrhage, unspecified: Secondary | ICD-10-CM

## 2016-04-04 DIAGNOSIS — I10 Essential (primary) hypertension: Secondary | ICD-10-CM | POA: Diagnosis not present

## 2016-04-04 DIAGNOSIS — F17210 Nicotine dependence, cigarettes, uncomplicated: Secondary | ICD-10-CM | POA: Diagnosis not present

## 2016-04-04 DIAGNOSIS — Z8679 Personal history of other diseases of the circulatory system: Secondary | ICD-10-CM | POA: Diagnosis not present

## 2016-04-04 DIAGNOSIS — Z09 Encounter for follow-up examination after completed treatment for conditions other than malignant neoplasm: Secondary | ICD-10-CM | POA: Insufficient documentation

## 2016-04-04 LAB — PROTIME-INR
INR: 1.1 (ref 0.00–1.49)
PROTHROMBIN TIME: 14.4 s (ref 11.6–15.2)

## 2016-04-04 LAB — CBC WITH DIFFERENTIAL/PLATELET
Basophils Absolute: 0 10*3/uL (ref 0.0–0.1)
Basophils Relative: 1 %
Eosinophils Absolute: 0.2 10*3/uL (ref 0.0–0.7)
Eosinophils Relative: 3 %
HCT: 40 % (ref 36.0–46.0)
HEMOGLOBIN: 13 g/dL (ref 12.0–15.0)
LYMPHS ABS: 2.8 10*3/uL (ref 0.7–4.0)
LYMPHS PCT: 44 %
MCH: 29 pg (ref 26.0–34.0)
MCHC: 32.5 g/dL (ref 30.0–36.0)
MCV: 89.3 fL (ref 78.0–100.0)
Monocytes Absolute: 0.5 10*3/uL (ref 0.1–1.0)
Monocytes Relative: 8 %
Neutro Abs: 2.8 10*3/uL (ref 1.7–7.7)
Neutrophils Relative %: 44 %
Platelets: 194 10*3/uL (ref 150–400)
RBC: 4.48 MIL/uL (ref 3.87–5.11)
RDW: 14.1 % (ref 11.5–15.5)
WBC: 6.3 10*3/uL (ref 4.0–10.5)

## 2016-04-04 LAB — BASIC METABOLIC PANEL
ANION GAP: 8 (ref 5–15)
BUN: 10 mg/dL (ref 6–20)
CALCIUM: 8.8 mg/dL — AB (ref 8.9–10.3)
CHLORIDE: 111 mmol/L (ref 101–111)
CO2: 23 mmol/L (ref 22–32)
Creatinine, Ser: 0.59 mg/dL (ref 0.44–1.00)
GFR calc non Af Amer: >60 mL/min (ref >60)
Glucose, Bld: 93 mg/dL (ref 65–99)
Potassium: 3.9 mmol/L (ref 3.5–5.1)
Sodium: 142 mmol/L (ref 135–145)

## 2016-04-04 LAB — APTT: aPTT: 33 seconds (ref 24–37)

## 2016-04-04 MED ORDER — MIDAZOLAM HCL 2 MG/2ML IJ SOLN
INTRAMUSCULAR | Status: AC | PRN
  Administered 2016-04-04: 1 mg via INTRAVENOUS

## 2016-04-04 MED ORDER — SODIUM CHLORIDE 0.9 % IV SOLN
INTRAVENOUS | Status: DC
  Administered 2016-04-04: 11:00:00 via INTRAVENOUS

## 2016-04-04 MED ORDER — HEPARIN SOD (PORK) LOCK FLUSH 100 UNIT/ML IV SOLN
INTRAVENOUS | Status: AC
  Filled 2016-04-04: qty 20

## 2016-04-04 MED ORDER — HEPARIN SODIUM (PORCINE) 1000 UNIT/ML IJ SOLN
INTRAMUSCULAR | Status: AC | PRN
  Administered 2016-04-04: 2000 [IU] via INTRAVENOUS

## 2016-04-04 MED ORDER — SODIUM CHLORIDE 0.9 % IV SOLN: INTRAVENOUS | Status: DC

## 2016-04-04 MED ORDER — FENTANYL CITRATE (PF) 100 MCG/2ML IJ SOLN
INTRAMUSCULAR | Status: AC
  Filled 2016-04-04: qty 2

## 2016-04-04 MED ORDER — MIDAZOLAM HCL 2 MG/2ML IJ SOLN
INTRAMUSCULAR | Status: AC
  Filled 2016-04-04: qty 2

## 2016-04-04 MED ORDER — IOPAMIDOL (ISOVUE-300) INJECTION 61%
INTRAVENOUS | Status: AC
  Filled 2016-04-04: qty 100

## 2016-04-04 MED ORDER — HYDROCODONE-ACETAMINOPHEN 5-325 MG PO TABS: 1.0000 | ORAL_TABLET | ORAL | Status: DC | PRN

## 2016-04-04 MED ORDER — LIDOCAINE HCL 1 % IJ SOLN
INTRAMUSCULAR | Status: AC
  Administered 2016-04-04: 11 mL
  Filled 2016-04-04: qty 20

## 2016-04-04 MED ORDER — FENTANYL CITRATE (PF) 100 MCG/2ML IJ SOLN
INTRAMUSCULAR | Status: AC | PRN
  Administered 2016-04-04: 25 ug via INTRAVENOUS

## 2016-04-04 NOTE — Sedation Documentation (Signed)
Patient is resting comfortably. 

## 2016-04-04 NOTE — Op Note (Signed)
DIAGNOSTIC CEREBRAL ANGIOGRAM    OPERATOR:   Dr. Lisbeth Renshaw, MD  HISTORY:   The patient is a 60 y.o. yo female with a history of subarachnoid hemorrhage and clipping of a RMCA aneurysm about 1 year ago. She has made an excellent recovery, and presents today for routine f/u diagnostic angiogram.  APPROACH:   The technical aspects of the procedure as well as its potential risks and benefits were reviewed with the patient. These risks included but were not limited bleeding, infection, allergic reaction, damage to organs/vital structures, stroke, non-diagnostic procedure, and the catastrophic outcomes of heart attack, coma, and death. With an understanding of these risks, informed consent was obtained and witnessed.    The patient was placed in the supine position on the angiography table and the skin of right groin prepped in the usual sterile fashion. The procedure was performed under local anesthesia (1%-solution of bicarbonate-bufferred Lidoacaine) and conscious sedation with Versed and fentanyl monitored by the in-suite nurse.    Using ultrasound guidance, the right common femoral artery was identified. Micropuncture needle was used to cannulate the CFA under direct ultrasound visualization. A 5- French sheath was introduced in the right common femoral artery using Seldinger technique.  A fluorophase sequence was used to document the sheath position.    HEPARIN: 2000 Units total.   CONTRAST AGENT: 50cc, Omnipaque 300   FLUOROSCOPY TIME: See IR records  CATHETER(S) AND WIRE(S):    5-French JB-1 glidecatheter   0.035" glidewire    VESSELS CATHETERIZED:   Right internal carotid   Left common carotid   Right vertebral   Left vertebral   Right common femoral  VESSELS STUDIED:   Right internal carotid, head Right vertebral Left common carotid, head Left vertebral Right femoral  PROCEDURAL NARRATIVE:   A 5-Fr JB-1 terumo glide catheter was advanced over a 0.035 glidewire into  the aortic arch. The above vessels were then sequentially catheterized and cervical/cerebral angiograms taken. After review of images, the catheter was removed without incident.    INTERPRETATION:   Right internal carotid: head:   Injection reveals the presence of a widely patent ICA, M1, and A1 segments and their branches. Two curved clips are seen at the right MCA bifurcation, without any aneurysm identified. The distal MCA branches are widely patent.  The parenchymal and venous phases are normal. The venous sinuses are widely patent.    Left common carotid: head:   Injection reveals the presence of a widely patent ICA, A1, and M1 segments and their branches. There is no significant stenosis, occlusion, aneurysm, or high flow vascular malformation visualized. The parenchymal and venous phases are normal. The venous sinuses are widely patent.    Left vertebral:   Injection reveals the presence of a widely patent vertebral artery. This leads to a widely patent basilar artery that terminates in bilateral P1. The basilar apex is normal. There is no significant stenosis, occlusion, aneurysm, or vascular malformation visualized. The parenchymal and venous phases are normal. The venous sinuses are widely patent.    Right vertebral:    Normal vessel. No PICA aneurysm. See basilar description above.    Right femoral:    Normal vessel. No significant atherosclerotic disease. Arterial sheath in adequate position.   DISPOSITION:  Upon completion of the study, the femoral sheath was removed and hemostasis obtained using a 5-Fr ExoSeal closure device. Good proximal and distal lower extremity pulses were documented upon achievement of hemostasis.    The procedure was well tolerated and no early  complications were observed.       The patient was transferred back to the holding area to be positioned flat in bed for 3 hours of observation.    IMPRESSION:  1. Complete occlusion of a right middle cerebral  artery aneurysm 1 year after clipping. No aneurysm residual or recurrence is seen.  The preliminary results of this procedure were shared with the patient and the patient's family.

## 2016-04-04 NOTE — Sedation Documentation (Signed)
Patient denies pain and is resting comfortably.  

## 2016-04-04 NOTE — H&P (Signed)
CC:  Aneurysm  HPI: Renee Murray is a 60 y.o. female who suffered subarachnoid hemorrhage and underwent clipping of an irregular RMCA aneurysm 1 year ago. She has made an excellent recovery and presents today for routine f/u diagnostic angiogram.  PMH: Past Medical History  Diagnosis Date  . Hypertension   . Brain aneurysm     PSH: Past Surgical History  Procedure Laterality Date  . Cholecystectomy    . Abdominal hysterectomy    . Craniotomy Right 02/04/2015    Procedure: RIGHT CRANIOTOMY FOR ANEURYSM ;  Surgeon: Lisbeth RenshawNeelesh Markus Casten, MD;  Location: [facility];  Service: Neurosurgery;  Laterality: Right;    SH: Social History  Substance Use Topics  . Smoking status: Current Every Day Smoker -- 0.25 packs/day for 20 years    Types: Cigarettes  . Smokeless tobacco: Not on file  . Alcohol Use: No    MEDS: Prior to Admission medications   Medication Sig Start Date End Date Taking? Authorizing Provider  amLODipine (NORVASC) 5 MG tablet Take 1 tablet (5 mg total) by mouth daily. 01/25/16  Yes Quentin Angstlugbemiga E Jegede, MD  fluticasone (FLONASE) 50 MCG/ACT nasal spray Place 2 sprays into the nose daily. Patient taking differently: Place 2 sprays into the nose daily as needed for allergies.  09/27/13  Yes Servando Salinaatherine H Rossi, NP  gabapentin (NEURONTIN) 300 MG capsule TAKE 1 CAPSULE BY MOUTH 3 TIMES DAILY. 12/29/15  Yes Massie MaroonLachina M Hollis, FNP  ibuprofen (ADVIL,MOTRIN) 200 MG tablet Take 400-800 mg by mouth every 8 (eight) hours as needed for mild pain or moderate pain.   Yes Historical Provider, MD  loratadine (CLARITIN) 10 MG tablet Take 10 mg by mouth daily. Reported on 01/25/2016   Yes Historical Provider, MD  trolamine salicylate (ASPERCREME) 10 % cream Apply 1 application topically as needed for muscle pain.   Yes Historical Provider, MD    ALLERGY: Allergies  Allergen Reactions  . Demerol Other (See Comments)    "almost died"  . Morphine And Related Other (See Comments)    "Almost  Died"    ROS: ROS  NEUROLOGIC EXAM: Awake, alert, oriented Memory and concentration grossly intact Speech fluent, appropriate CN grossly intact Motor exam: Upper Extremities Deltoid Bicep Tricep Grip  Right 5/5 5/5 5/5 5/5  Left 5/5 5/5 5/5 5/5   Lower Extremity IP Quad PF DF EHL  Right 5/5 5/5 5/5 5/5 5/5  Left 5/5 5/5 5/5 5/5 5/5   Sensation grossly intact to LT  IMPRESSION: - 60 y.o. female 1 year s/p clipping of ruptured RMCA aneurysm  PLAN: - Proceed with diagnostic cerebral angiogram  I have reviewed the indications, risks, benefits, and alternatives to angiogram with the patient and her daughter. All questions were answered.

## 2016-04-04 NOTE — Discharge Instructions (Signed)
Angiogram, Care After °Refer to this sheet in the next few weeks. These instructions provide you with information about caring for yourself after your procedure. Your health care provider may also give you more specific instructions. Your treatment has been planned according to current medical practices, but problems sometimes occur. Call your health care provider if you have any problems or questions after your procedure. °WHAT TO EXPECT AFTER THE PROCEDURE °After your procedure, it is typical to have the following: °· Bruising at the catheter insertion site that usually fades within 1-2 weeks. °· Blood collecting in the tissue (hematoma) that may be painful to the touch. It should usually decrease in size and tenderness within 1-2 weeks. °HOME CARE INSTRUCTIONS °· Take medicines only as directed by your health care provider. °· You may shower 24-48 hours after the procedure or as directed by your health care provider. Remove the bandage (dressing) and gently wash the site with plain soap and water. Pat the area dry with a clean towel. Do not rub the site, because this may cause bleeding. °· Do not take baths, swim, or use a hot tub until your health care provider approves. °· Check your insertion site every day for redness, swelling, or drainage. °· Do not apply powder or lotion to the site. °· Do not lift over 10 lb (4.5 kg) for 5 days after your procedure or as directed by your health care provider. °· Ask your health care provider when it is okay to: °¨ Return to work or school. °¨ Resume usual physical activities or sports. °¨ Resume sexual activity. °· Do not drive home if you are discharged the same day as the procedure. Have someone else drive you. °· You may drive 24 hours after the procedure unless otherwise instructed by your health care provider. °· Do not operate machinery or power tools for 24 hours after the procedure or as directed by your health care provider. °· If your procedure was done as an  outpatient procedure, which means that you went home the same day as your procedure, a responsible adult should be with you for the first 24 hours after you arrive home. °· Keep all follow-up visits as directed by your health care provider. This is important. °SEEK MEDICAL CARE IF: °· You have a fever. °· You have chills. °· You have increased bleeding from the catheter insertion site. Hold pressure on the site. °SEEK IMMEDIATE MEDICAL CARE IF: °· You have unusual pain at the catheter insertion site. °· You have redness, warmth, or swelling at the catheter insertion site. °· You have drainage (other than a small amount of blood on the dressing) from the catheter insertion site. °· The catheter insertion site is bleeding, and the bleeding does not stop after 30 minutes of holding steady pressure on the site. °· The area near or just beyond the catheter insertion site becomes pale, cool, tingly, or numb. °  °This information is not intended to replace advice given to you by your health care provider. Make sure you discuss any questions you have with your health care provider. °  °Document Released: 06/22/2005 Document Revised: 12/25/2014 Document Reviewed: 05/07/2013 °Elsevier Interactive Patient Education ©2016 Elsevier Inc. ° °

## 2016-04-21 ENCOUNTER — Other Ambulatory Visit: Payer: Self-pay | Admitting: Family Medicine

## 2016-04-21 MED FILL — GABAPENTIN 300 MG CAPSULE: 300 | 30 days supply | Qty: 90 | Fill #0

## 2016-04-25 ENCOUNTER — Encounter: Payer: Self-pay | Admitting: Internal Medicine

## 2016-04-25 ENCOUNTER — Encounter: Payer: Self-pay | Admitting: Gastroenterology

## 2016-04-25 ENCOUNTER — Ambulatory Visit (INDEPENDENT_AMBULATORY_CARE_PROVIDER_SITE_OTHER): Payer: BLUE CROSS/BLUE SHIELD | Admitting: Internal Medicine

## 2016-04-25 VITALS — BP 108/51 | HR 67 | Temp 98.4°F | Resp 18 | Ht 59.0 in | Wt 160.0 lb

## 2016-04-25 DIAGNOSIS — Z1211 Encounter for screening for malignant neoplasm of colon: Secondary | ICD-10-CM

## 2016-04-25 DIAGNOSIS — Z720 Tobacco use: Secondary | ICD-10-CM

## 2016-04-25 DIAGNOSIS — Z1239 Encounter for other screening for malignant neoplasm of breast: Secondary | ICD-10-CM

## 2016-04-25 DIAGNOSIS — G629 Polyneuropathy, unspecified: Secondary | ICD-10-CM | POA: Diagnosis not present

## 2016-04-25 DIAGNOSIS — I10 Essential (primary) hypertension: Secondary | ICD-10-CM

## 2016-04-25 LAB — CBC WITH DIFFERENTIAL/PLATELET
Basophils Absolute: 56 cells/uL (ref 0–200)
Basophils Relative: 1 %
EOS ABS: 224 {cells}/uL (ref 15–500)
Eosinophils Relative: 4 %
HEMATOCRIT: 38.8 % (ref 35.0–45.0)
HEMOGLOBIN: 12.7 g/dL (ref 11.7–15.5)
LYMPHS PCT: 49 %
Lymphs Abs: 2744 cells/uL (ref 850–3900)
MCH: 29.5 pg (ref 27.0–33.0)
MCHC: 32.7 g/dL (ref 32.0–36.0)
MCV: 90 fL (ref 80.0–100.0)
MONO ABS: 392 {cells}/uL (ref 200–950)
MPV: 11 fL (ref 7.5–12.5)
Monocytes Relative: 7 %
NEUTROS PCT: 39 %
Neutro Abs: 2184 cells/uL (ref 1500–7800)
Platelets: 198 10*3/uL (ref 140–400)
RBC: 4.31 MIL/uL (ref 3.80–5.10)
RDW: 14.6 % (ref 11.0–15.0)
WBC: 5.6 10*3/uL (ref 3.8–10.8)

## 2016-04-25 NOTE — Progress Notes (Signed)
Patient ID: Renee Murray, female   DOB: 01/12/1956, 60 y.o.   MRN: [id number]   Renee Murray, is a 60 y.o. female  [id number]  [id number]  DOB - 10/23/1956  Chief Complaint  Patient presents with  . Follow-up    HTN        Subjective:   Renee Murray is a 60 y.o. female with history of brain aneurysm, hypertension and neuropathy here today for a follow up visit. Patient is complaining of ongoing and worsening pain on her right shoulder and arm and wrist joint up to her ring and little fingers. She feels occasional numbness and tingling on her right hand and she has to constantly shake her hands to "release blood circulation". Patient has been seen by her neurosurgeon and recently had a procedure done for aneurysm. Patient could not tolerate Tylenol 3, so she is only taking gabapentin which has been increased to 300 mg tablet by mouth 3 times a day and ibuprofen occasionally. She does not want any narcotics. She continues to smoke cigarettes heavily up to about 1 pack per day. She is hoping to quit someday soon. Patient has No headache, No chest pain, No abdominal pain - No Nausea. No problems updated.  ALLERGIES: Allergies  Allergen Reactions  . Demerol Other (See Comments)    "almost died"  . Morphine And Related Other (See Comments)    "Almost Died"    PAST MEDICAL HISTORY: Past Medical History  Diagnosis Date  . Hypertension   . Brain aneurysm     MEDICATIONS AT HOME: Prior to Admission medications   Medication Sig Start Date End Date Taking? Authorizing Provider  amLODipine (NORVASC) 5 MG tablet Take 1 tablet (5 mg total) by mouth daily. 01/25/16  Yes Quentin Angstlugbemiga E Xzayvion Vaeth, MD  fluticasone (FLONASE) 50 MCG/ACT nasal spray Place 2 sprays into the nose daily. Patient taking differently: Place 2 sprays into the nose daily as needed for allergies.  09/27/13  Yes Servando Salinaatherine H Rossi, NP  gabapentin (NEURONTIN) 300 MG capsule TAKE 1 CAPSULE BY MOUTH 3 TIMES DAILY. 04/21/16   Yes Quentin Angstlugbemiga E Macyn Remmert, MD  ibuprofen (ADVIL,MOTRIN) 200 MG tablet Take 400-800 mg by mouth every 8 (eight) hours as needed for mild pain or moderate pain.   Yes Historical Provider, MD  loratadine (CLARITIN) 10 MG tablet Take 10 mg by mouth daily. Reported on 01/25/2016   Yes Historical Provider, MD  trolamine salicylate (ASPERCREME) 10 % cream Apply 1 application topically as needed for muscle pain.   Yes Historical Provider, MD     Objective:   Filed Vitals:   04/25/16 0958  BP: 108/51  Pulse: 67  Temp: 98.4 F (36.9 C)  TempSrc: Oral  Resp: 18  Height: 4\' 11"  (1.499 m)  Weight: 160 lb (72.576 kg)  SpO2: 96%    Exam General appearance : Awake, alert, not in any distress. Speech Clear. Not toxic looking HEENT: Atraumatic and Normocephalic, pupils equally reactive to light and accomodation Neck: supple, no JVD. No cervical lymphadenopathy.  Chest:Good air entry bilaterally, no added sounds  CVS: S1 S2 regular, no murmurs.  Abdomen: Bowel sounds present, Non tender and not distended with no gaurding, rigidity or rebound. Extremities: B/L Lower Ext shows no edema, both legs are warm to touch. Power is 5/5 globally. Neurology: Awake alert, and oriented X 3, CN II-XII intact, Non focal Skin:No Rash  Data Review Lab Results  Component Value Date   HGBA1C 5.9* 09/13/2015   HGBA1C 5.8* 03/17/2014  Assessment & Plan   1. Neuropathy (HCC)  - DG Shoulder Right; Future - DG Cervical Spine Complete; Future - ANA, IFA Comprehensive Panel  2. Tobacco abuse  [name] was counseled on the dangers of tobacco use, and was advised to quit. Reviewed strategies to maximize success, including removing cigarettes and smoking materials from environment, stress management and support of family/friends.  3. Essential hypertension  - CBC with Differential  4. Colon cancer screening  - Ambulatory referral to Gastroenterology  5. Breast cancer screening  - MM Digital Screening;  Future  Patient have been counseled extensively about nutrition and exercise  Return in about 3 months (around 07/26/2016) for Shoulder Pain, Follow up HTN.  The patient was given clear instructions to go to ER or return to medical center if symptoms don't improve, worsen or new problems develop. The patient verbalized understanding. The patient was told to call to get lab results if they haven't heard anything in the next week.   This note has been created with Education officer, environmental. Any transcriptional errors are unintentional.    Jeanann Lewandowsky, MD, MHA, Maxwell Caul, CPE [facility] [place], [place] [phone]   04/25/2016, 10:38 AM

## 2016-04-25 NOTE — Patient Instructions (Addendum)
Shoulder Pain The shoulder is the joint that connects your arms to your body. The bones that form the shoulder joint include the upper arm bone (humerus), the shoulder blade (scapula), and the collarbone (clavicle). The top of the humerus is shaped like a ball and fits into a rather flat socket on the scapula (glenoid cavity). A combination of muscles and strong, fibrous tissues that connect muscles to bones (tendons) support your shoulder joint and hold the ball in the socket. Small, fluid-filled sacs (bursae) are located in different areas of the joint. They act as cushions between the bones and the overlying soft tissues and help reduce friction between the gliding tendons and the bone as you move your arm. Your shoulder joint allows a wide range of motion in your arm. This range of motion allows you to do things like scratch your back or throw a ball. However, this range of motion also makes your shoulder more prone to pain from overuse and injury. Causes of shoulder pain can originate from both injury and overuse and usually can be grouped in the following four categories:  Redness, swelling, and pain (inflammation) of the tendon (tendinitis) or the bursae (bursitis).  Instability, such as a dislocation of the joint.  Inflammation of the joint (arthritis).  Broken bone (fracture). HOME CARE INSTRUCTIONS   Apply ice to the sore area.  Put ice in a plastic bag.  Place a towel between your skin and the bag.  Leave the ice on for 15-20 minutes, 3-4 times per day for the first 2 days, or as directed by your health care provider.  Stop using cold packs if they do not help with the pain.  If you have a shoulder sling or immobilizer, wear it as long as your caregiver instructs. Only remove it to shower or bathe. Move your arm as little as possible, but keep your hand moving to prevent swelling.  Squeeze a soft ball or foam pad as much as possible to help prevent swelling.  Only take  over-the-counter or prescription medicines for pain, discomfort, or fever as directed by your caregiver. SEEK MEDICAL CARE IF:   Your shoulder pain increases, or new pain develops in your arm, hand, or fingers.  Your hand or fingers become cold and numb.  Your pain is not relieved with medicines. SEEK IMMEDIATE MEDICAL CARE IF:   Your arm, hand, or fingers are numb or tingling.  Your arm, hand, or fingers are significantly swollen or turn white or blue. MAKE SURE YOU:   Understand these instructions.  Will watch your condition.  Will get help right away if you are not doing well or get worse.   This information is not intended to replace advice given to you by your health care provider. Make sure you discuss any questions you have with your health care provider.   Document Released: 09/13/2005 Document Revised: 12/25/2014 Document Reviewed: 03/29/2015 Elsevier Interactive Patient Education 2016 [employer]. Hypertension Hypertension, commonly called high blood pressure, is when the force of blood pumping through your arteries is too strong. Your arteries are the blood vessels that carry blood from your heart throughout your body. A blood pressure reading consists of a higher number over a lower number, such as 110/72. The higher number (systolic) is the pressure inside your arteries when your heart pumps. The lower number (diastolic) is the pressure inside your arteries when your heart relaxes. Ideally you want your blood pressure below 120/80. Hypertension forces your heart to work harder to  pump blood. Your arteries may become narrow or stiff. Having untreated or uncontrolled hypertension can cause heart attack, stroke, kidney disease, and other problems. RISK FACTORS Some risk factors for high blood pressure are controllable. Others are not.  Risk factors you cannot control include:   Race. You may be at higher risk if you are African American.  Age. Risk increases with  age.  Gender. Men are at higher risk than women before age 54 years. After age 69, women are at higher risk than men. Risk factors you can control include:  Not getting enough exercise or physical activity.  Being overweight.  Getting too much fat, sugar, calories, or salt in your diet.  Drinking too much alcohol. SIGNS AND SYMPTOMS Hypertension does not usually cause signs or symptoms. Extremely high blood pressure (hypertensive crisis) may cause headache, anxiety, shortness of breath, and nosebleed. DIAGNOSIS To check if you have hypertension, your health care provider will measure your blood pressure while you are seated, with your arm held at the level of your heart. It should be measured at least twice using the same arm. Certain conditions can cause a difference in blood pressure between your right and left arms. A blood pressure reading that is higher than normal on one occasion does not mean that you need treatment. If it is not clear whether you have high blood pressure, you may be asked to return on a different day to have your blood pressure checked again. Or, you may be asked to monitor your blood pressure at home for 1 or more weeks. TREATMENT Treating high blood pressure includes making lifestyle changes and possibly taking medicine. Living a healthy lifestyle can help lower high blood pressure. You may need to change some of your habits. Lifestyle changes may include:  Following the DASH diet. This diet is high in fruits, vegetables, and whole grains. It is low in salt, red meat, and added sugars.  Keep your sodium intake below 2,300 mg per day.  Getting at least 30-45 minutes of aerobic exercise at least 4 times per week.  Losing weight if necessary.  Not smoking.  Limiting alcoholic beverages.  Learning ways to reduce stress. Your health care provider may prescribe medicine if lifestyle changes are not enough to get your blood pressure under control, and if one of  the following is true:  You are 75-69 years of age and your systolic blood pressure is above 140.  You are 27 years of age or older, and your systolic blood pressure is above 150.  Your diastolic blood pressure is above 90.  You have diabetes, and your systolic blood pressure is over 140 or your diastolic blood pressure is over 90.  You have kidney disease and your blood pressure is above 140/90.  You have heart disease and your blood pressure is above 140/90. Your personal target blood pressure may vary depending on your medical conditions, your age, and other factors. HOME CARE INSTRUCTIONS  Have your blood pressure rechecked as directed by your health care provider.   Take medicines only as directed by your health care provider. Follow the directions carefully. Blood pressure medicines must be taken as prescribed. The medicine does not work as well when you skip doses. Skipping doses also puts you at risk for problems.  Do not smoke.   Monitor your blood pressure at home as directed by your health care provider. SEEK MEDICAL CARE IF:   You think you are having a reaction to medicines taken.  You  have recurrent headaches or feel dizzy.  You have swelling in your ankles.  You have trouble with your vision. SEEK IMMEDIATE MEDICAL CARE IF:  You develop a severe headache or confusion.  You have unusual weakness, numbness, or feel faint.  You have severe chest or abdominal pain.  You vomit repeatedly.  You have trouble breathing. MAKE SURE YOU:   Understand these instructions.  Will watch your condition.  Will get help right away if you are not doing well or get worse.   This information is not intended to replace advice given to you by your health care provider. Make sure you discuss any questions you have with your health care provider.   Document Released: 12/04/2005 Document Revised: 04/20/2015 Document Reviewed: 09/26/2013 Elsevier Interactive Patient  Education [employer]2016 Elsevier Inc.

## 2016-04-25 NOTE — Progress Notes (Signed)
Patient is here for FU HTN  Patient complains of arthritiis pain in hands. Currently scaled at a 5.  Patient has taken medications today. Patient has not eaten today.

## 2016-04-27 ENCOUNTER — Ambulatory Visit (HOSPITAL_COMMUNITY)
Admission: RE | Admit: 2016-04-27 | Discharge: 2016-04-27 | Disposition: A | Discharge status: Home / Self Care | Payer: BLUE CROSS/BLUE SHIELD | Source: Ambulatory Visit | Attending: Internal Medicine | Admitting: Internal Medicine

## 2016-04-27 DIAGNOSIS — M50322 Other cervical disc degeneration at C5-C6 level: Secondary | ICD-10-CM | POA: Insufficient documentation

## 2016-04-27 DIAGNOSIS — R918 Other nonspecific abnormal finding of lung field: Secondary | ICD-10-CM | POA: Insufficient documentation

## 2016-04-27 DIAGNOSIS — G629 Polyneuropathy, unspecified: Secondary | ICD-10-CM | POA: Diagnosis present

## 2016-04-27 LAB — ANA, IFA COMPREHENSIVE PANEL
ANA: NEGATIVE
ENA SM Ab Ser-aCnc: <1
SM/RNP: <1
SSA (Ro) (ENA) Antibody, IgG: <1
SSB (LA) (ENA) ANTIBODY, IGG: NEGATIVE
Scleroderma (Scl-70) (ENA) Antibody, IgG: <1
ds DNA Ab: 1 IU/mL

## 2016-05-09 ENCOUNTER — Ambulatory Visit
Admission: RE | Admit: 2016-05-09 | Discharge: 2016-05-09 | Disposition: A | Discharge status: Home / Self Care | Payer: BLUE CROSS/BLUE SHIELD | Source: Ambulatory Visit | Attending: Internal Medicine | Admitting: Internal Medicine

## 2016-05-09 DIAGNOSIS — Z1239 Encounter for other screening for malignant neoplasm of breast: Secondary | ICD-10-CM

## 2016-05-09 MED FILL — AMLODIPINE BESYLATE 5 MG TA: 5 | 30 days supply | Qty: 30 | Fill #0

## 2016-05-10 ENCOUNTER — Other Ambulatory Visit: Payer: Self-pay | Admitting: Internal Medicine

## 2016-05-10 DIAGNOSIS — R918 Other nonspecific abnormal finding of lung field: Secondary | ICD-10-CM

## 2016-05-16 ENCOUNTER — Other Ambulatory Visit: Payer: Self-pay | Admitting: Internal Medicine

## 2016-05-16 DIAGNOSIS — R928 Other abnormal and inconclusive findings on diagnostic imaging of breast: Secondary | ICD-10-CM

## 2016-05-23 ENCOUNTER — Telehealth: Payer: Self-pay | Admitting: *Deleted

## 2016-05-23 NOTE — Telephone Encounter (Signed)
Medical Assistant left message on patient's home and cell voicemail. Voicemail states to give a call back to Gao Mitnick with [facility] at [phone].  

## 2016-05-23 NOTE — Telephone Encounter (Signed)
Medical Assistant left message on patient's home and cell voicemail. Voicemail states to give a call back to Nubia with [facility] at [phone].  

## 2016-05-23 NOTE — Telephone Encounter (Signed)
-----   Message from Quentin Angstlugbemiga E Jegede, MD sent at 05/10/2016  1:00 PM EDT ----- Please inform patient that her neck x-ray showed moderate narrowing bilaterally, there is a vague opacity in the right lung apex than needs chest x-ray for further evaluation. All other results are within normal limits. Shoulder x-ray is normal. No evidence of autoimmune disease in the blood test. Chest x-ray ordered.

## 2016-05-23 NOTE — Telephone Encounter (Signed)
-----   Message from Quentin Angstlugbemiga E Jegede, MD sent at 05/16/2016  5:35 PM EDT ----- Please inform patient that her screening mammogram showed a possible mass in both the right and left breasts that requires further evaluation. We will order a diagnostic mammogram and ultrasound of both breasts.

## 2016-05-23 NOTE — Telephone Encounter (Signed)
-----   Message from Quentin Angstlugbemiga E Jegede, MD sent at 05/10/2016 12:57 PM EDT ----- Please inform patient that her neck x-ray showed moderate narrowing bilaterally, there is a vague opacity in the right lung apex than needs chest x-ray for further evaluation. All other results are within normal limits. Shoulder x-ray is normal. No evidence of autoimmune disease in the blood test. Chest x-ray ordered.

## 2016-05-29 ENCOUNTER — Telehealth: Payer: Self-pay

## 2016-05-29 NOTE — Telephone Encounter (Signed)
Patient had called and left message on voicemail to call with results. I called patient today 05/29/2016 @11 :45AM. I advised of labs showing no evidence on an autoimmune disorder. I advised of xray results and the need to have a chest xray done to further evaluate the area over right lung, patient verbalized understanding and states she will go to the radiology department to have this done asap. I advised patient of mammogram results of possible mass in both breast and the need to have diagnostic mammogram done with ultrasound. Patient sates she will call now to get this scheduled.  Thanks!

## 2016-06-02 ENCOUNTER — Ambulatory Visit (HOSPITAL_COMMUNITY)
Admission: RE | Admit: 2016-06-02 | Discharge: 2016-06-02 | Disposition: A | Discharge status: Home / Self Care | Payer: BLUE CROSS/BLUE SHIELD | Source: Ambulatory Visit | Attending: Internal Medicine | Admitting: Internal Medicine

## 2016-06-02 ENCOUNTER — Other Ambulatory Visit: Payer: Self-pay

## 2016-06-02 ENCOUNTER — Ambulatory Visit
Admission: RE | Admit: 2016-06-02 | Discharge: 2016-06-02 | Disposition: A | Discharge status: Home / Self Care | Payer: BLUE CROSS/BLUE SHIELD | Source: Ambulatory Visit | Attending: Internal Medicine | Admitting: Internal Medicine

## 2016-06-02 DIAGNOSIS — R928 Other abnormal and inconclusive findings on diagnostic imaging of breast: Secondary | ICD-10-CM

## 2016-06-02 DIAGNOSIS — R918 Other nonspecific abnormal finding of lung field: Secondary | ICD-10-CM | POA: Diagnosis present

## 2016-06-07 ENCOUNTER — Other Ambulatory Visit: Payer: Self-pay

## 2016-06-07 MED FILL — AMLODIPINE BESYLATE 5 MG TA: 5 | 30 days supply | Qty: 30 | Fill #1

## 2016-06-08 ENCOUNTER — Telehealth: Payer: Self-pay | Admitting: *Deleted

## 2016-06-08 NOTE — Telephone Encounter (Signed)
-----   Message from Quentin Angstlugbemiga E Jegede, MD sent at 06/05/2016  3:12 PM EDT ----- Please inform patient that her mammogram and breast ultrasound showed probable benign cluster of cysts in the left breast. Short-term interval followup left breast ultrasound in 6 months is recommended.  Her chest xray showed no acute abnormality. The right apical opacity seen on the recent cervical spine radiographs appears to be chronic and benign.

## 2016-06-08 NOTE — Telephone Encounter (Signed)
Medical Assistant left message on patient's home and cell voicemail. Voicemail states to give a call back to Nubia with [facility] at [phone].  

## 2016-06-12 ENCOUNTER — Telehealth: Payer: Self-pay

## 2016-06-12 NOTE — Telephone Encounter (Signed)
Received a message through the answering service to call patient regarding lab results.   Called and spoke with patient advised of chest xray and that the area was begin. Patient verbalized understanding and was already aware of the mammogram results. Thanks!

## 2016-06-23 MED FILL — GABAPENTIN 300 MG CAPSULE: 300 | 30 days supply | Qty: 90 | Fill #1

## 2016-07-04 ENCOUNTER — Encounter: Payer: Self-pay | Admitting: Gastroenterology

## 2016-07-17 ENCOUNTER — Other Ambulatory Visit: Payer: Self-pay | Admitting: Internal Medicine

## 2016-07-17 MED FILL — AMLODIPINE BESYLATE 5 MG TA: 5 | 30 days supply | Qty: 30 | Fill #2

## 2016-07-17 MED FILL — GABAPENTIN 300 MG CAPSULE: 300 | 30 days supply | Qty: 90 | Fill #0

## 2016-07-26 ENCOUNTER — Ambulatory Visit (INDEPENDENT_AMBULATORY_CARE_PROVIDER_SITE_OTHER): Payer: BLUE CROSS/BLUE SHIELD | Admitting: Family Medicine

## 2016-07-26 ENCOUNTER — Encounter: Payer: Self-pay | Admitting: Family Medicine

## 2016-07-26 VITALS — BP 130/60 | HR 68 | Temp 98.2°F | Resp 16 | Ht 59.0 in | Wt 159.0 lb

## 2016-07-26 DIAGNOSIS — I10 Essential (primary) hypertension: Secondary | ICD-10-CM | POA: Diagnosis not present

## 2016-07-26 DIAGNOSIS — Z23 Encounter for immunization: Secondary | ICD-10-CM

## 2016-07-26 DIAGNOSIS — Z114 Encounter for screening for human immunodeficiency virus [HIV]: Secondary | ICD-10-CM

## 2016-07-26 DIAGNOSIS — R7303 Prediabetes: Secondary | ICD-10-CM | POA: Diagnosis not present

## 2016-07-26 DIAGNOSIS — Z1159 Encounter for screening for other viral diseases: Secondary | ICD-10-CM

## 2016-07-26 LAB — POCT GLYCOSYLATED HEMOGLOBIN (HGB A1C): HEMOGLOBIN A1C: 5.9

## 2016-07-26 LAB — CBC WITH DIFFERENTIAL/PLATELET
BASOS PCT: 1 %
Basophils Absolute: 59 cells/uL (ref 0–200)
EOS ABS: 177 {cells}/uL (ref 15–500)
Eosinophils Relative: 3 %
HEMATOCRIT: 40.4 % (ref 35.0–45.0)
HEMOGLOBIN: 13.2 g/dL (ref 11.7–15.5)
LYMPHS ABS: 2891 {cells}/uL (ref 850–3900)
LYMPHS PCT: 49 %
MCH: 29.5 pg (ref 27.0–33.0)
MCHC: 32.7 g/dL (ref 32.0–36.0)
MCV: 90.2 fL (ref 80.0–100.0)
MONO ABS: 413 {cells}/uL (ref 200–950)
MPV: 11.4 fL (ref 7.5–12.5)
Monocytes Relative: 7 %
Neutro Abs: 2360 cells/uL (ref 1500–7800)
Neutrophils Relative %: 40 %
Platelets: 182 10*3/uL (ref 140–400)
RBC: 4.48 MIL/uL (ref 3.80–5.10)
RDW: 14.6 % (ref 11.0–15.0)
WBC: 5.9 10*3/uL (ref 3.8–10.8)

## 2016-07-26 MED ORDER — FLUTICASONE PROPIONATE 50 MCG/ACT NA SUSP: 2.0000 | Freq: Every day | NASAL | 2 refills | Status: DC

## 2016-07-26 MED ORDER — AMLODIPINE BESYLATE 5 MG PO TABS: 5.0000 mg | ORAL_TABLET | Freq: Every day | ORAL | 5 refills | Status: DC

## 2016-07-26 MED ORDER — GABAPENTIN 300 MG PO CAPS: 300.0000 mg | ORAL_CAPSULE | Freq: Three times a day (TID) | ORAL | 2 refills | Status: DC

## 2016-07-26 NOTE — Progress Notes (Signed)
Renee Murray, is a 60 y.o. female  [id number]  [id number]  DOB - 07/11/1956  CC:  Chief Complaint  Patient presents with  . Recurrent Skin Infections    on back   . Hypertension  . Shoulder Pain    bilateral shoulder pain, gabapentin not helping   . Anxiety       HPI: Renee Murray is a 60 y.o. female here for follow-up of hypertension and shoulder and back pain and neuropathy. .She is currently on amlodipine 5 mg for hypertension and gabapentin for pain. She also ask for a refill on Flonase which she uses for allergy symptoms. She has a history of a SAH in February 2016, with surgical intervention. She reports still smoking a few cagarettes a day. She does yardwork and walks for exercise. She is concerned about a lump on her back.She also complains of anxiety and would like a referral to mental health.   Allergies  Allergen Reactions  . Demerol Other (See Comments)    "almost died"  . Morphine And Related Other (See Comments)    "Almost Died"   Past Medical History:  Diagnosis Date  . Brain aneurysm   . Hypertension    Current Outpatient Prescriptions on File Prior to Visit  Medication Sig Dispense Refill  . ibuprofen (ADVIL,MOTRIN) 200 MG tablet Take 400-800 mg by mouth every 8 (eight) hours as needed for mild pain or moderate pain.    Marland Kitchen. loratadine (CLARITIN) 10 MG tablet Take 10 mg by mouth daily. Reported on 01/25/2016    . trolamine salicylate (ASPERCREME) 10 % cream Apply 1 application topically as needed for muscle pain.     No current facility-administered medications on file prior to visit.    History reviewed. No pertinent family history. Social History   Social History  . Marital status: Divorced    Spouse name: N/A  . Number of children: N/A  . Years of education: N/A   Occupational History  . Not on file.   Social History Main Topics  . Smoking status: Current Every Day Smoker    Packs/day: 0.25    Years: 20.00    Types: Cigarettes  .  Smokeless tobacco: Never Used  . Alcohol use No  . Drug use: No  . Sexual activity: Not on file   Other Topics Concern  . Not on file   Social History Narrative  . No narrative on file    Review of Systems: Constitutional: Negative for fever, chills, appetite change, weight loss,  Fatigue. Skin: Negative for rashes or lesions of concern. Lump on back HENT: Negative for ear pain, ear discharge.nose bleeds Eyes: Negative for pain, discharge, redness, itching. Decline in vision Neck: Negative for pain, stiffness Respiratory: Negative for cough, shortness of breath,   Cardiovascular: Negative for chest pain, palpitations and leg swelling. Gastrointestinal: Negative for abdominal pain, nausea, vomiting, diarrhea, constipations Genitourinary: Negative for dysuria, urgency, f hematuria. Reports frequency Musculoskeletal: Positive for low back pain with radiation into the right leg.  Neurological: Negative for dizziness, tremors, seizures, syncope,   light-headedness, numbness and headaches.  Hematological: Negative for easy bruising or bleeding Psychiatric/Behavioral: Negative for depression, anxiety, decreased concentration, confusion   Objective:   Vitals:   07/26/16 0943  BP: 130/60  Pulse: 68  Resp: 16  Temp: 98.2 F (36.8 C)    Physical Exam: Constitutional: Patient appears well-developed and well-nourished. No distress. HENT: Normocephalic, atraumatic, External right and left ear normal. Oropharynx is clear and moist.  Eyes: Conjunctivae  and EOM are normal. PERRLA, no scleral icterus. Neck: Normal ROM. Neck supple. No lymphadenopathy, No thyromegaly. CVS: RRR, S1/S2 +, no murmurs, no gallops, no rubs Pulmonary: Effort and breath sounds normal, no stridor, rhonchi, wheezes, rales.  Abdominal: Soft. Normoactive BS,, no distension, tenderness, rebound or guarding.  Musculoskeletal: Normal range of motion. No edema and no tenderness.  Neuro: Alert.Normal muscle tone  coordination. Non-focal Skin: Skin is warm and dry. No rash noted. Not diaphoretic. No erythema. No pallor.There is a 10mm blackhead on her mid back.  Psychiatric: Normal mood and affect. Behavior, judgment, thought content normal.  Lab Results  Component Value Date   WBC 5.6 04/25/2016   HGB 12.7 04/25/2016   HCT 38.8 04/25/2016   MCV 90.0 04/25/2016   PLT 198 04/25/2016   Lab Results  Component Value Date   CREATININE 0.59 04/04/2016   BUN 10 04/04/2016   NA 142 04/04/2016   K 3.9 04/04/2016   CL 111 04/04/2016   CO2 23 04/04/2016    Lab Results  Component Value Date   HGBA1C 5.9 07/26/2016   Lipid Panel     Component Value Date/Time   CHOL 163 09/13/2015 1210   TRIG 109 09/13/2015 1210   HDL 42 (L) 09/13/2015 1210   CHOLHDL 3.9 09/13/2015 1210   VLDL 22 09/13/2015 1210   LDLCALC 99 09/13/2015 1210       Assessment and plan:   1. Prediabetes  - POCT glycosylated hemoglobin (Hb A1C)  2. Essential hypertension  - COMPLETE METABOLIC PANEL WITH GFR - CBC w/Diff - Lipid panel  3. Screening for HIV (human immunodeficiency virus)  - HIV antibody (with reflex)  4. Need for hepatitis C screening test  - Hepatitis C Antibody  5. Blackhead -I have explained that this is a cosmetic not a health issue and I cannot address this.   Return in about 6 months (around 01/26/2017).  The patient was given clear instructions to go to ER or return to medical center if symptoms don't improve, worsen or new problems develop. The patient verbalized understanding.    Henrietta Hoover FNP  07/26/2016, 11:59 AM

## 2016-07-27 LAB — COMPLETE METABOLIC PANEL WITH GFR
ALT: 15 U/L (ref 6–29)
AST: 16 U/L (ref 10–35)
Albumin: 4 g/dL (ref 3.6–5.1)
Alkaline Phosphatase: 73 U/L (ref 33–130)
BUN: 11 mg/dL (ref 7–25)
CHLORIDE: 107 mmol/L (ref 98–110)
CO2: 26 mmol/L (ref 20–31)
CREATININE: 0.61 mg/dL (ref 0.50–0.99)
Calcium: 9 mg/dL (ref 8.6–10.4)
GFR, Est Non African American: >89 mL/min (ref >=60)
Glucose, Bld: 106 mg/dL — ABNORMAL HIGH (ref 65–99)
POTASSIUM: 4 mmol/L (ref 3.5–5.3)
Sodium: 142 mmol/L (ref 135–146)
Total Bilirubin: 0.3 mg/dL (ref 0.2–1.2)
Total Protein: 6.3 g/dL (ref 6.1–8.1)

## 2016-07-27 LAB — LIPID PANEL
CHOL/HDL RATIO: 4.7 ratio (ref <=5.0)
Cholesterol: 177 mg/dL (ref 125–200)
HDL: 38 mg/dL — AB (ref >=46)
LDL CALC: 99 mg/dL (ref <130)
TRIGLYCERIDES: 199 mg/dL — AB (ref <150)
VLDL: 40 mg/dL — AB (ref <30)

## 2016-07-27 LAB — HEPATITIS C ANTIBODY: HCV Ab: NEGATIVE

## 2016-07-27 LAB — HIV ANTIBODY (ROUTINE TESTING W REFLEX): HIV: NONREACTIVE

## 2016-08-01 ENCOUNTER — Telehealth: Payer: Self-pay

## 2016-08-01 NOTE — Telephone Encounter (Signed)
Called, no answer. Left message to advised of normal labs and if patient had any further questions to call back to our office and call back number was provided. Thanks!

## 2016-08-30 ENCOUNTER — Encounter (HOSPITAL_COMMUNITY): Payer: Self-pay | Admitting: Emergency Medicine

## 2016-08-30 DIAGNOSIS — N390 Urinary tract infection, site not specified: Secondary | ICD-10-CM | POA: Insufficient documentation

## 2016-08-30 DIAGNOSIS — R531 Weakness: Secondary | ICD-10-CM | POA: Diagnosis not present

## 2016-08-30 DIAGNOSIS — F17210 Nicotine dependence, cigarettes, uncomplicated: Secondary | ICD-10-CM | POA: Insufficient documentation

## 2016-08-30 DIAGNOSIS — I10 Essential (primary) hypertension: Secondary | ICD-10-CM | POA: Insufficient documentation

## 2016-08-30 DIAGNOSIS — R509 Fever, unspecified: Secondary | ICD-10-CM | POA: Diagnosis present

## 2016-08-30 LAB — COMPREHENSIVE METABOLIC PANEL
ALBUMIN: 3.6 g/dL (ref 3.5–5.0)
ALK PHOS: 71 U/L (ref 38–126)
ALT: 15 U/L (ref 14–54)
ANION GAP: 9 (ref 5–15)
AST: 29 U/L (ref 15–41)
BILIRUBIN TOTAL: 0.3 mg/dL (ref 0.3–1.2)
BUN: 7 mg/dL (ref 6–20)
CALCIUM: 8.6 mg/dL — AB (ref 8.9–10.3)
CO2: 21 mmol/L — AB (ref 22–32)
Chloride: 107 mmol/L (ref 101–111)
Creatinine, Ser: 0.77 mg/dL (ref 0.44–1.00)
GFR calc Af Amer: >60 mL/min (ref >60)
GFR calc non Af Amer: >60 mL/min (ref >60)
GLUCOSE: 189 mg/dL — AB (ref 65–99)
Potassium: 3.7 mmol/L (ref 3.5–5.1)
Sodium: 137 mmol/L (ref 135–145)
TOTAL PROTEIN: 6.3 g/dL — AB (ref 6.5–8.1)

## 2016-08-30 LAB — CBC WITH DIFFERENTIAL/PLATELET
Basophils Absolute: 0 10*3/uL (ref 0.0–0.1)
Basophils Relative: 0 %
EOS PCT: 1 %
Eosinophils Absolute: 0.1 10*3/uL (ref 0.0–0.7)
HCT: 39.1 % (ref 36.0–46.0)
Hemoglobin: 12.8 g/dL (ref 12.0–15.0)
LYMPHS ABS: 2.6 10*3/uL (ref 0.7–4.0)
LYMPHS PCT: 28 %
MCH: 29.5 pg (ref 26.0–34.0)
MCHC: 32.7 g/dL (ref 30.0–36.0)
MCV: 90.1 fL (ref 78.0–100.0)
MONO ABS: 0.7 10*3/uL (ref 0.1–1.0)
MONOS PCT: 8 %
Neutro Abs: 5.7 10*3/uL (ref 1.7–7.7)
Neutrophils Relative %: 63 %
PLATELETS: 171 10*3/uL (ref 150–400)
RBC: 4.34 MIL/uL (ref 3.87–5.11)
RDW: 14.6 % (ref 11.5–15.5)
WBC: 9.1 10*3/uL (ref 4.0–10.5)

## 2016-08-30 NOTE — ED Triage Notes (Signed)
Pt. reports chronic dizziness/lightheaded for several weeks with right lateral neck pain , "I'm sleeping a lot "  denies injury , respirations unlabored , alert and oriented .

## 2016-08-31 ENCOUNTER — Emergency Department (HOSPITAL_COMMUNITY): Payer: BLUE CROSS/BLUE SHIELD

## 2016-08-31 ENCOUNTER — Encounter (HOSPITAL_COMMUNITY): Payer: Self-pay | Admitting: Radiology

## 2016-08-31 ENCOUNTER — Emergency Department (HOSPITAL_COMMUNITY)
Admission: EM | Admit: 2016-08-31 | Discharge: 2016-08-31 | Disposition: A | Discharge status: Home / Self Care | Payer: BLUE CROSS/BLUE SHIELD | Attending: Emergency Medicine | Admitting: Emergency Medicine

## 2016-08-31 DIAGNOSIS — R531 Weakness: Secondary | ICD-10-CM

## 2016-08-31 DIAGNOSIS — N390 Urinary tract infection, site not specified: Secondary | ICD-10-CM

## 2016-08-31 LAB — URINALYSIS, ROUTINE W REFLEX MICROSCOPIC
Bilirubin Urine: NEGATIVE
GLUCOSE, UA: NEGATIVE mg/dL
Ketones, ur: NEGATIVE mg/dL
Nitrite: NEGATIVE
PROTEIN: NEGATIVE mg/dL
Specific Gravity, Urine: 1.009 (ref 1.005–1.030)
pH: 6 (ref 5.0–8.0)

## 2016-08-31 LAB — URINE MICROSCOPIC-ADD ON

## 2016-08-31 LAB — I-STAT TROPONIN, ED: TROPONIN I, POC: 0 ng/mL (ref 0.00–0.08)

## 2016-08-31 MED ORDER — CEPHALEXIN 500 MG PO CAPS: 500.0000 mg | ORAL_CAPSULE | Freq: Two times a day (BID) | ORAL | 0 refills | Status: DC

## 2016-08-31 MED ORDER — DEXTROSE 5 % IV SOLN
1.0000 g | Freq: Once | INTRAVENOUS | Status: AC
  Administered 2016-08-31: 1 g via INTRAVENOUS
  Filled 2016-08-31: qty 10

## 2016-08-31 MED FILL — AMLODIPINE BESYLATE 5 MG TA: 5 | 30 days supply | Qty: 30 | Fill #3

## 2016-08-31 NOTE — ED Notes (Signed)
Signature pad not working. Pt acknowledges understanding of discharge paperwork

## 2016-08-31 NOTE — ED Provider Notes (Signed)
[facility] Provider Note   CSN: [id number] Arrival date & time: 08/30/16  2049  By signing my name below, I, Renee Murray, attest that this documentation has been prepared under the direction and in the presence of Loren Raceravid Iisha Soyars, MD . Electronically Signed: Majel HomerPeyton Murray, Scribe. 08/31/2016. 6:12 AM.  History   Chief Complaint Chief Complaint  Patient presents with  . Dizziness    Lightheaded   The history is provided by the patient. No language interpreter was used.   HPI Comments: Renee Murray is a 60 y.o. female with PMHx of HTN and brain aneurysm, who presents to the Emergency Department complaining of gradually worsening, headache centralized to her forehead and dizziness that began 2 weeks ago and worsened today. Pt reports she has become increasingly "unsteady" over the past 2 weeks and has been "walking really slow." States she feels like "she is in a fog". She states she has been sleeping more often lately. She has a history of sinusitis and has been taking Claritin. States she has pressure in her sinuses which is chronic for her. She denies cough, nausea, vomiting, abdominal pain and shortness of breath. No focal weakness or numbness.   Past Medical History:  Diagnosis Date  . Brain aneurysm   . Hypertension     Patient Active Problem List   Diagnosis Date Noted  . Essential hypertension 01/25/2016  . Chronic pain syndrome 01/25/2016  . Prediabetes 10/13/2015  . Anxiety about health 09/13/2015  . Neuropathy (HCC) 09/13/2015  . Chronic generalized pain 09/13/2015  . History of seasonal allergies 09/13/2015  . SAH (subarachnoid hemorrhage) (HCC)   . Subarachnoid hemorrhage (HCC) 02/04/2015  . History of sinus disease on CT 08/10/2014  . Hypertension, goal below 150/90 03/17/2014  . Health care maintenance 03/17/2014  . Tobacco abuse 03/17/2014  . Neuropathy, upper extremity bilaterally  08/06/2013    Past Surgical History:  Procedure Laterality Date  .  ABDOMINAL HYSTERECTOMY    . CHOLECYSTECTOMY    . CRANIOTOMY Right 02/04/2015   Procedure: RIGHT CRANIOTOMY FOR ANEURYSM ;  Surgeon: Lisbeth RenshawNeelesh Nundkumar, MD;  Location: [facility];  Service: Neurosurgery;  Laterality: Right;    OB History    No data available       Home Medications    Prior to Admission medications   Medication Sig Start Date End Date Taking? Authorizing Provider  amLODipine (NORVASC) 5 MG tablet Take 1 tablet (5 mg total) by mouth daily. 07/26/16  Yes Henrietta HooverLinda C Bernhardt, NP  fluticasone (FLONASE) 50 MCG/ACT nasal spray Place 2 sprays into both nostrils daily. Patient taking differently: Place 2 sprays into both nostrils daily as needed for allergies or rhinitis.  07/26/16  Yes Henrietta HooverLinda C Bernhardt, NP  gabapentin (NEURONTIN) 300 MG capsule Take 1 capsule (300 mg total) by mouth 3 (three) times daily. 07/26/16  Yes Henrietta HooverLinda C Bernhardt, NP  ibuprofen (ADVIL,MOTRIN) 200 MG tablet Take 400-800 mg by mouth every 8 (eight) hours as needed for mild pain or moderate pain.   Yes Historical Provider, MD  loratadine (CLARITIN) 10 MG tablet Take 10 mg by mouth daily. Reported on 01/25/2016   Yes Historical Provider, MD  trolamine salicylate (ASPERCREME) 10 % cream Apply 1 application topically as needed for muscle pain.   Yes Historical Provider, MD  cephALEXin (KEFLEX) 500 MG capsule Take 1 capsule (500 mg total) by mouth 2 (two) times daily. 08/31/16   Loren Raceravid Harvy Riera, MD    Family History No family history on file.  Social  History Social History  Substance Use Topics  . Smoking status: Current Every Day Smoker    Packs/day: 0.25    Years: 20.00    Types: Cigarettes  . Smokeless tobacco: Never Used  . Alcohol use No     Allergies   Demerol and Morphine and related   Review of Systems Review of Systems  Constitutional: Positive for chills and fever.  HENT: Positive for congestion and sinus pressure. Negative for sore throat.   Eyes: Negative for visual disturbance.    Respiratory: Negative for cough and shortness of breath.   Cardiovascular: Negative for chest pain, palpitations and leg swelling.  Gastrointestinal: Negative for abdominal pain, constipation, diarrhea, nausea and vomiting.  Musculoskeletal: Positive for myalgias and neck pain. Negative for arthralgias, back pain, gait problem, joint swelling and neck stiffness.  Skin: Negative for rash and wound.  Neurological: Positive for dizziness, weakness (generalized) and headaches. Negative for tremors, syncope, facial asymmetry, speech difficulty, light-headedness and numbness.  Psychiatric/Behavioral: The patient is nervous/anxious.   All other systems reviewed and are negative.  Physical Exam Updated Vital Signs BP 125/70 (BP Location: Right Arm)   Pulse 92   Temp 100.5 F (38.1 C) (Oral)   Resp 18   Ht 4\' 11"  (1.499 m)   Wt 163 lb (73.9 kg)   SpO2 98%   BMI 32.92 kg/m   Physical Exam  Constitutional: She is oriented to person, place, and time. She appears well-developed and well-nourished.  HENT:  Head: Normocephalic and atraumatic.  Mouth/Throat: Oropharynx is clear and moist.  Nasal mucosal edema without sinus tenderness to percussion.  Eyes: EOM are normal. Pupils are equal, round, and reactive to light.  No nystagmus  Neck: Normal range of motion. Neck supple.  No meningismus. Patient does have some right sternocleidomastoid tenderness to palpation. No masses or evidence of injury.  Cardiovascular: Normal rate and regular rhythm.   Pulmonary/Chest: Effort normal and breath sounds normal. No respiratory distress. She has no wheezes. She has no rales. She exhibits no tenderness.  Abdominal: Soft. Bowel sounds are normal. There is no tenderness. There is no rebound and no guarding.  Musculoskeletal: Normal range of motion. She exhibits no edema or tenderness.  No CVA tenderness. No lower extremity swelling, asymmetry or tenderness. 2+ distal pulses in all extremities.   Lymphadenopathy:    She has no cervical adenopathy.  Neurological: She is alert and oriented to person, place, and time.  Patient is alert and oriented x3 with clear, goal oriented speech. Patient has 5/5 motor in all extremities. Sensation is intact to light touch. Bilateral finger-to-nose is normal with no signs of dysmetria. Patient has a normal gait and walks without assistance.  Skin: Skin is warm and dry. Capillary refill takes less than 2 seconds. No rash noted. No erythema.  Psychiatric: She has a normal mood and affect. Her behavior is normal.  Nursing note and vitals reviewed.   ED Treatments / Results  Labs (all labs ordered are listed, but only abnormal results are displayed) Labs Reviewed  COMPREHENSIVE METABOLIC PANEL - Abnormal; Notable for the following:       Result Value   CO2 21 (*)    Glucose, Bld 189 (*)    Calcium 8.6 (*)    Total Protein 6.3 (*)    All other components within normal limits  URINALYSIS, ROUTINE W REFLEX MICROSCOPIC (NOT AT [facility]) - Abnormal; Notable for the following:    APPearance CLOUDY (*)    Hgb urine dipstick SMALL (*)  Leukocytes, UA LARGE (*)    All other components within normal limits  URINE MICROSCOPIC-ADD ON - Abnormal; Notable for the following:    Squamous Epithelial / LPF 0-5 (*)    Bacteria, UA MANY (*)    All other components within normal limits  CBC WITH DIFFERENTIAL/PLATELET  Rosezena Sensor, ED    EKG  EKG Interpretation  Date/Time:  Wednesday August 30 2016 20:55:09 EDT Ventricular Rate:  101 PR Interval:  138 QRS Duration: 70 QT Interval:  330 QTC Calculation: 427 R Axis:   59 Text Interpretation:  Sinus tachycardia Nonspecific ST and T wave abnormality Abnormal ECG Confirmed by Ranae Palms  MD, Rillie Riffel ([id number]) on 08/31/2016 3:28:34 AM       Radiology Dg Chest 2 View  Result Date: 08/31/2016 CLINICAL DATA:  Chest pain EXAM: CHEST  2 VIEW COMPARISON:  Chest radiograph 06/02/2016 FINDINGS:  Cardiomediastinal contours are normal. No pneumothorax or sizable pleural effusion. No focal airspace consolidation or pulmonary edema. IMPRESSION: Clear lungs. Electronically Signed   By: Deatra Robinson M.D.   On: 08/31/2016 04:15   Ct Maxillofacial Wo Contrast  Result Date: 08/31/2016 CLINICAL DATA:  Facial pain and dizziness EXAM: CT MAXILLOFACIAL WITHOUT CONTRAST TECHNIQUE: Multidetector CT imaging of the maxillofacial structures was performed. Multiplanar CT image reconstructions were also generated. A small metallic BB was placed on the right temple in order to reliably differentiate right from left. COMPARISON:  None. FINDINGS: Osseous: --Complex facial fracture types: No LeFort, zygomaticomaxillary complex or nasoorbitoethmoidal fracture. --Simple fracture types: None. -- Visualized Mandible: No fracture or dislocation. Orbits: The globes appear intact. Normal appearance of the intra- and extraconal fat. Symmetric extraocular muscles. Sinuses: No fluid levels or advanced mucosal thickening. Soft tissues: Normal visualized extracranial soft tissues. Limited intracranial: Status post right pterional craniotomy for aneurysm clipping. IMPRESSION: 1. No finding to explain the reported facial pain. 2. Clear paranasal sinuses and sinus drainage pathways. Electronically Signed   By: Deatra Robinson M.D.   On: 08/31/2016 05:26    Procedures Procedures (including critical care time)  Medications Ordered in ED Medications  cefTRIAXone (ROCEPHIN) 1 g in dextrose 5 % 50 mL IVPB (not administered)     Initial Impression / Assessment and Plan / ED Course  I have reviewed the triage vital signs and the nursing notes.  Pertinent labs & imaging results that were available during my care of the patient were reviewed by me and considered in my medical decision making (see chart for details).  Clinical Course  DIAGNOSTIC STUDIES:  Oxygen Saturation is 98% on RA, normal by my interpretation.    COORDINATION  OF CARE:  3:46 AM Discussed treatment plan with pt at bedside and pt agreed to plan.  Patient has a normal neurologic exam. No new headaches.Patient for subarachnoid hemorrhage. Symptoms are fairly vague and appear most consistent with generalized weakness and fatigue. Patient with low-grade temperature in the emergency department and evidence of urinary tract infection. No evidence of sinus infection on CT. We'll give dose of IV Rocephin in the emergency department and sent home with prescription for antibiotics. Patient understands the need to return immediately for any worsening of her symptoms and to follow-up with her primary physician.  I personally performed the services described in this documentation, which was scribed in my presence. The recorded information has been reviewed and is accurate.   Final Clinical Impressions(s) / ED Diagnoses   Final diagnoses:  UTI (lower urinary tract infection)  Generalized weakness    New Prescriptions  New Prescriptions   CEPHALEXIN (KEFLEX) 500 MG CAPSULE    Take 1 capsule (500 mg total) by mouth 2 (two) times daily.     Loren Racer, MD 08/31/16 [phone]

## 2016-08-31 NOTE — ED Notes (Signed)
Patient transported to CT 

## 2016-08-31 NOTE — ED Notes (Signed)
Patient transported to X-ray 

## 2016-09-29 MED FILL — AMLODIPINE BESYLATE 5 MG TA: 5 | 30 days supply | Qty: 30 | Fill #4

## 2016-10-02 MED FILL — GABAPENTIN 300 MG CAPSULE: 300 | 30 days supply | Qty: 90 | Fill #1

## 2016-10-02 MED FILL — FLUTICASONE PROP 50 MCG SPR: 50 | 30 days supply | Qty: 16 | Fill #0

## 2016-10-09 ENCOUNTER — Encounter (HOSPITAL_COMMUNITY): Payer: Self-pay | Admitting: *Deleted

## 2016-10-09 ENCOUNTER — Ambulatory Visit (HOSPITAL_COMMUNITY)
Admission: EM | Admit: 2016-10-09 | Discharge: 2016-10-09 | Disposition: A | Discharge status: Home / Self Care | Payer: BLUE CROSS/BLUE SHIELD | Attending: Family Medicine | Admitting: Family Medicine

## 2016-10-09 DIAGNOSIS — L723 Sebaceous cyst: Secondary | ICD-10-CM | POA: Diagnosis not present

## 2016-10-09 NOTE — ED Triage Notes (Signed)
Pt    Reports     Symptoms   Of     An  abcess         To  Her  Mid       Back   That has  Been there  For  Quite  A  While      Getting  Bigger     Pt    Ambulated  To  Room  With a  Steady  Fluid  Gait

## 2016-10-09 NOTE — ED Provider Notes (Signed)
[facility]    CSN: [id number] Arrival date & time: 10/09/16  1321     History   Chief Complaint Chief Complaint  Patient presents with  . Abscess    HPI Renee Murray is a 60 y.o. female.   The history is provided by the patient.  Abscess  Location:  Torso Torso abscess location:  Upper back Size:  1.5cm Abscess quality: induration and painful   Red streaking: no   Duration:  6 months Progression:  Worsening Pain details:    Progression:  Worsening Chronicity:  New Context comment:  Daughter expressed fluid from lesion and advised further i+d. Relieved by:  Draining/squeezing Ineffective treatments:  Draining/squeezing Associated symptoms: no fever     Past Medical History:  Diagnosis Date  . Brain aneurysm   . Hypertension     Patient Active Problem List   Diagnosis Date Noted  . Essential hypertension 01/25/2016  . Chronic pain syndrome 01/25/2016  . Prediabetes 10/13/2015  . Anxiety about health 09/13/2015  . Neuropathy (HCC) 09/13/2015  . Chronic generalized pain 09/13/2015  . History of seasonal allergies 09/13/2015  . SAH (subarachnoid hemorrhage) (HCC)   . Subarachnoid hemorrhage (HCC) 02/04/2015  . History of sinus disease on CT 08/10/2014  . Hypertension, goal below 150/90 03/17/2014  . Health care maintenance 03/17/2014  . Tobacco abuse 03/17/2014  . Neuropathy, upper extremity bilaterally  08/06/2013    Past Surgical History:  Procedure Laterality Date  . ABDOMINAL HYSTERECTOMY    . CHOLECYSTECTOMY    . CRANIOTOMY Right 02/04/2015   Procedure: RIGHT CRANIOTOMY FOR ANEURYSM ;  Surgeon: Lisbeth RenshawNeelesh Nundkumar, MD;  Location: [facility];  Service: Neurosurgery;  Laterality: Right;    OB History    No data available       Home Medications    Prior to Admission medications   Medication Sig Start Date End Date Taking? Authorizing Provider  amLODipine (NORVASC) 5 MG tablet Take 1 tablet (5 mg total) by mouth daily. 07/26/16    Henrietta HooverLinda C Bernhardt, NP  cephALEXin (KEFLEX) 500 MG capsule Take 1 capsule (500 mg total) by mouth 2 (two) times daily. 08/31/16   Loren Raceravid Yelverton, MD  fluticasone (FLONASE) 50 MCG/ACT nasal spray Place 2 sprays into both nostrils daily. Patient taking differently: Place 2 sprays into both nostrils daily as needed for allergies or rhinitis.  07/26/16   Henrietta HooverLinda C Bernhardt, NP  gabapentin (NEURONTIN) 300 MG capsule Take 1 capsule (300 mg total) by mouth 3 (three) times daily. 07/26/16   Henrietta HooverLinda C Bernhardt, NP  ibuprofen (ADVIL,MOTRIN) 200 MG tablet Take 400-800 mg by mouth every 8 (eight) hours as needed for mild pain or moderate pain.    Historical Provider, MD  loratadine (CLARITIN) 10 MG tablet Take 10 mg by mouth daily. Reported on 01/25/2016    Historical Provider, MD  trolamine salicylate (ASPERCREME) 10 % cream Apply 1 application topically as needed for muscle pain.    Historical Provider, MD    Family History No family history on file.  Social History Social History  Substance Use Topics  . Smoking status: Current Every Day Smoker    Packs/day: 0.25    Years: 20.00    Types: Cigarettes  . Smokeless tobacco: Never Used  . Alcohol use No     Allergies   Demerol and Morphine and related   Review of Systems Review of Systems  Constitutional: Negative.  Negative for fever.  Skin: Positive for wound.  All other systems reviewed  and are negative.    Physical Exam Triage Vital Signs ED Triage Vitals [10/09/16 1327]  Enc Vitals Group     BP 146/76     Pulse Rate 67     Resp 12     Temp 98.2 F (36.8 C)     Temp Source Oral     SpO2 98 %     Weight      Height      Head Circumference      Peak Flow      Pain Score      Pain Loc      Pain Edu?      Excl. in GC?    No data found.   Updated Vital Signs BP 146/76 (BP Location: Right Arm)   Pulse 67   Temp 98.2 F (36.8 C) (Oral)   Resp 12   SpO2 98%   Visual Acuity Right Eye Distance:   Left Eye Distance:     Bilateral Distance:    Right Eye Near:   Left Eye Near:    Bilateral Near:     Physical Exam  Constitutional: She is oriented to person, place, and time. She appears well-developed and well-nourished.  Neurological: She is alert and oriented to person, place, and time.  Skin: Skin is warm and dry. Rash noted. There is erythema.  Sebaceous cyst  Nursing note and vitals reviewed.    UC Treatments / Results  Labs (all labs ordered are listed, but only abnormal results are displayed) Labs Reviewed - No data to display  EKG  EKG Interpretation None       Radiology No results found.  Procedures .Marland KitchenIncision and Drainage Date/Time: 10/09/2016 1:57 PM Performed by: Linna Hoff Authorized by: Bradd Canary D   Consent:    Consent obtained:  Verbal   Risks discussed:  Incomplete drainage and pain   Alternatives discussed:  No treatment Location:    Type:  Cyst   Size:  1.5   Location:  Trunk   Trunk location:  Back Pre-procedure details:    Skin preparation:  Betadine Procedure type:    Complexity:  Simple Procedure details:    Needle aspiration: no     Incision types:  Cruciate   Incision depth:  Dermal   Scalpel blade:  11   Wound management:  Probed and deloculated   Drainage amount:  Moderate   Wound treatment:  Wound left open   Packing materials:  None Post-procedure details:    Patient tolerance of procedure:  Tolerated well, no immediate complications   (including critical care time)  Medications Ordered in UC Medications - No data to display   Initial Impression / Assessment and Plan / UC Course  I have reviewed the triage vital signs and the nursing notes.  Pertinent labs & imaging results that were available during my care of the patient were reviewed by me and considered in my medical decision making (see chart for details).  Clinical Course      Final Clinical Impressions(s) / UC Diagnoses   Final diagnoses:  None    New  Prescriptions New Prescriptions   No medications on file     Linna Hoff, MD 10/09/16 1404

## 2016-10-26 ENCOUNTER — Ambulatory Visit: Payer: Self-pay | Admitting: Family Medicine

## 2016-10-30 MED FILL — AMLODIPINE BESYLATE 5 MG TA: 5 | 30 days supply | Qty: 30 | Fill #5

## 2016-11-02 MED FILL — GABAPENTIN 300 MG CAPSULE: 300 | 30 days supply | Qty: 90 | Fill #2

## 2016-11-02 MED FILL — FLUTICASONE PROP 50 MCG SPR: 50 | 30 days supply | Qty: 16 | Fill #1

## 2016-11-27 ENCOUNTER — Other Ambulatory Visit: Payer: Self-pay | Admitting: Internal Medicine

## 2016-11-27 DIAGNOSIS — I10 Essential (primary) hypertension: Secondary | ICD-10-CM

## 2016-11-28 MED FILL — AMLODIPINE BESYLATE 5 MG TA: 5 | 30 days supply | Qty: 30 | Fill #0

## 2016-12-04 ENCOUNTER — Ambulatory Visit (INDEPENDENT_AMBULATORY_CARE_PROVIDER_SITE_OTHER): Payer: BLUE CROSS/BLUE SHIELD | Admitting: Family Medicine

## 2016-12-04 ENCOUNTER — Encounter: Payer: Self-pay | Admitting: Family Medicine

## 2016-12-04 VITALS — BP 136/67 | HR 69 | Temp 98.0°F | Resp 16 | Ht 59.0 in | Wt 161.0 lb

## 2016-12-04 DIAGNOSIS — M25551 Pain in right hip: Secondary | ICD-10-CM

## 2016-12-04 DIAGNOSIS — S4991XD Unspecified injury of right shoulder and upper arm, subsequent encounter: Secondary | ICD-10-CM | POA: Diagnosis not present

## 2016-12-04 DIAGNOSIS — S161XXD Strain of muscle, fascia and tendon at neck level, subsequent encounter: Secondary | ICD-10-CM

## 2016-12-04 DIAGNOSIS — S79921S Unspecified injury of right thigh, sequela: Secondary | ICD-10-CM

## 2016-12-04 DIAGNOSIS — M62838 Other muscle spasm: Secondary | ICD-10-CM

## 2016-12-04 DIAGNOSIS — S79911S Unspecified injury of right hip, sequela: Secondary | ICD-10-CM | POA: Diagnosis not present

## 2016-12-04 DIAGNOSIS — M25511 Pain in right shoulder: Secondary | ICD-10-CM

## 2016-12-04 MED ORDER — IBUPROFEN 600 MG PO TABS: 600.0000 mg | ORAL_TABLET | Freq: Three times a day (TID) | ORAL | 0 refills | Status: DC | PRN

## 2016-12-04 MED ORDER — CYCLOBENZAPRINE HCL 5 MG PO TABS: 5.0000 mg | ORAL_TABLET | Freq: Three times a day (TID) | ORAL | 0 refills | Status: DC | PRN
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
## 2016-12-19 ENCOUNTER — Ambulatory Visit: Payer: Self-pay | Admitting: Family Medicine

## 2016-12-19 ENCOUNTER — Ambulatory Visit: Payer: BLUE CROSS/BLUE SHIELD | Admitting: Family Medicine

## 2016-12-21 ENCOUNTER — Encounter (HOSPITAL_COMMUNITY): Payer: Self-pay | Admitting: Neurosurgery

## 2016-12-21 IMAGING — CT CT MAXILLOFACIAL W/O CM
3 series · 15 of 47 positions shown, 18 images · non-contrast
Comparison: None.

CLINICAL DATA: Facial pain and dizziness

EXAM:
CT MAXILLOFACIAL WITHOUT CONTRAST
TECHNIQUE: Multidetector CT imaging of the maxillofacial structures was
performed. Multiplanar CT image reconstructions were also generated.
A small metallic BB was placed on the right temple in order to
reliably differentiate right from left.

[Series 202: sinus standard · axial · 0.36mm/px · z∈[+87,+173]mm · 9 of 51 slices shown, 12 images]
[im 4/51  brain]
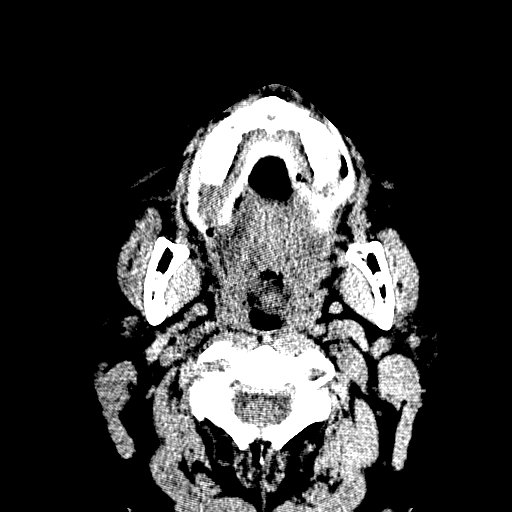
[im 4/51  bone]
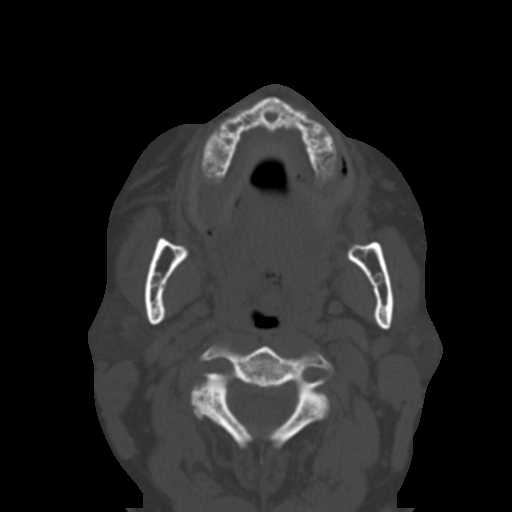
[im 9/51  bone]
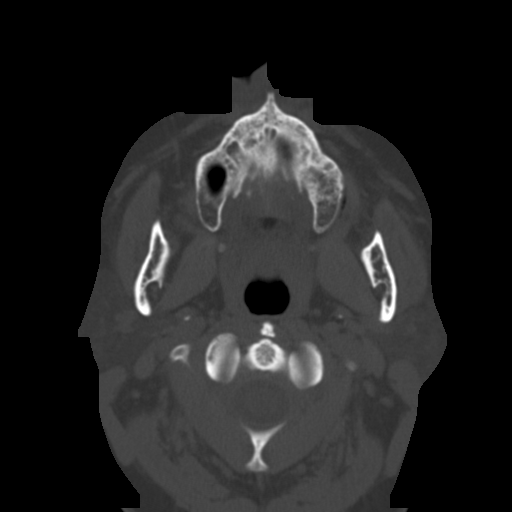
[im 14/51  bone]
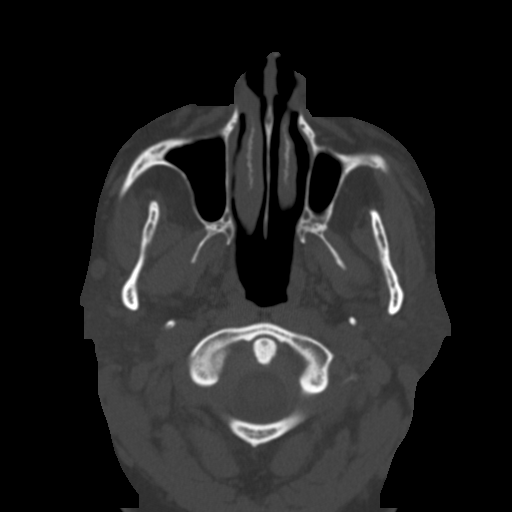
[im 19/51  bone]
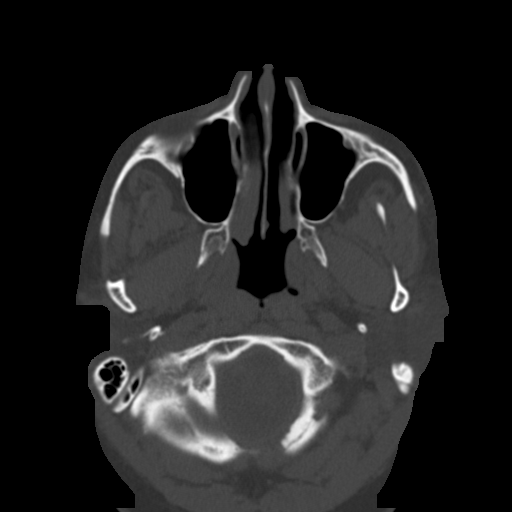
[im 26/51  brain]
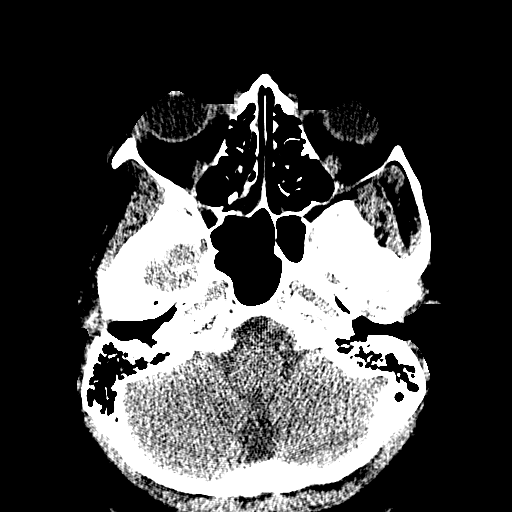
[im 26/51  bone]
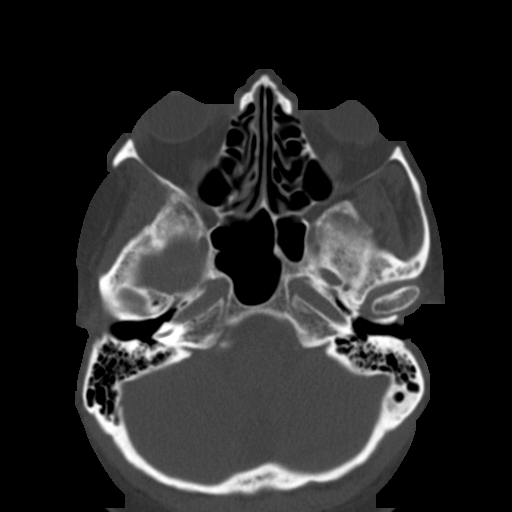
[im 32/51  bone]
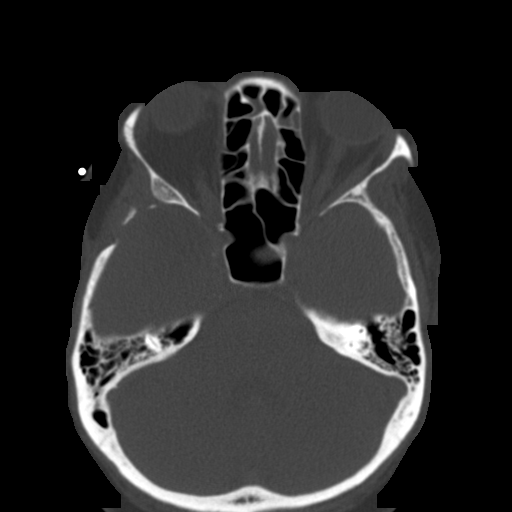
[im 37/51  bone]
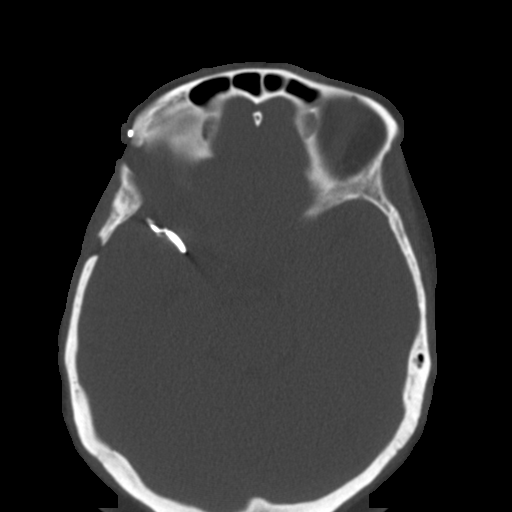
[im 42/51  bone]
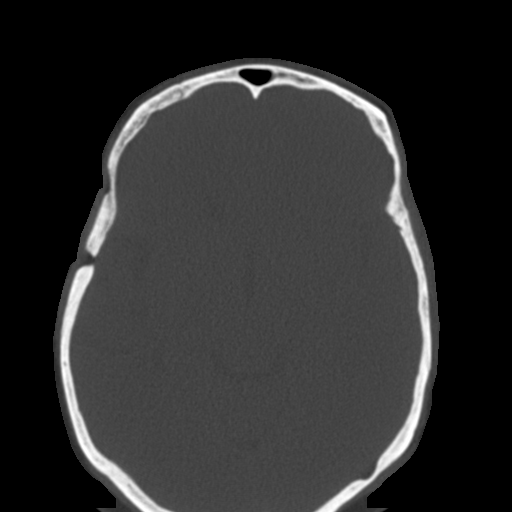
[im 47/51  brain]
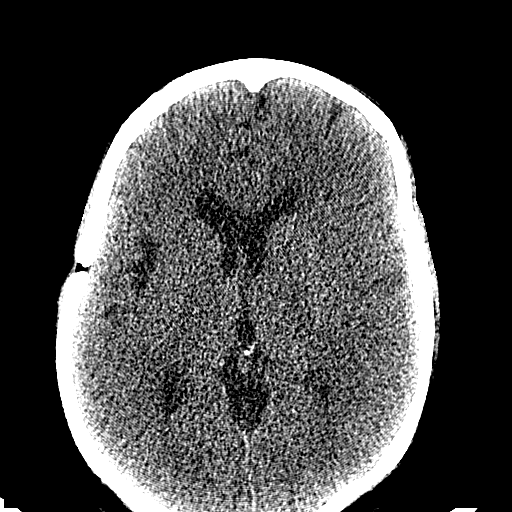
[im 47/51  bone]
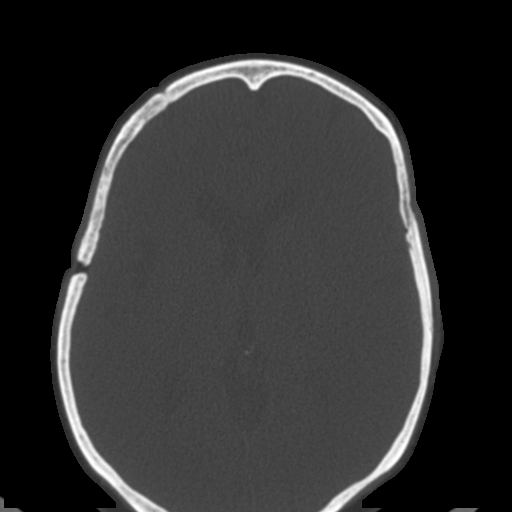

[Series 204: cor · coronal · 0.36mm/px · 3 of 66 slices shown]
[im 22/66  bone]
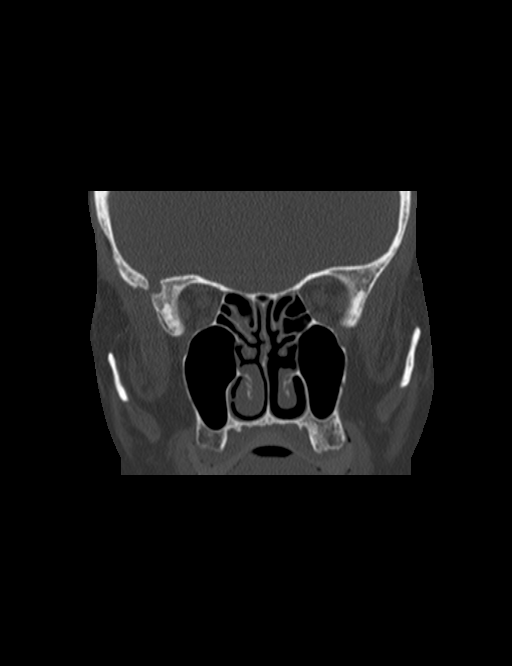
[im 29/66  bone]
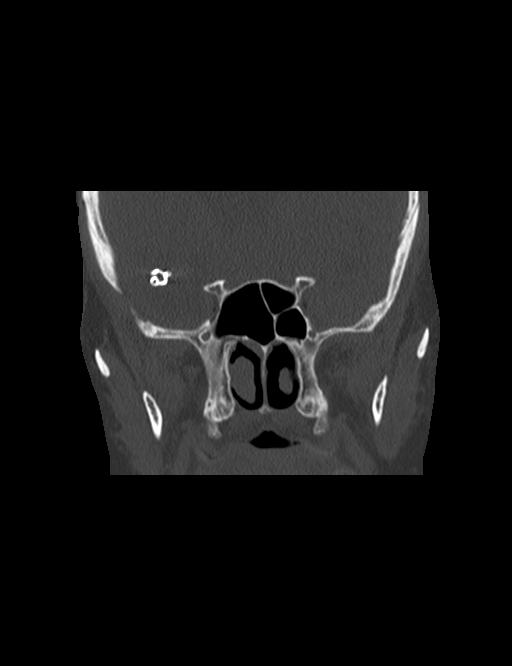
[im 37/66  bone]
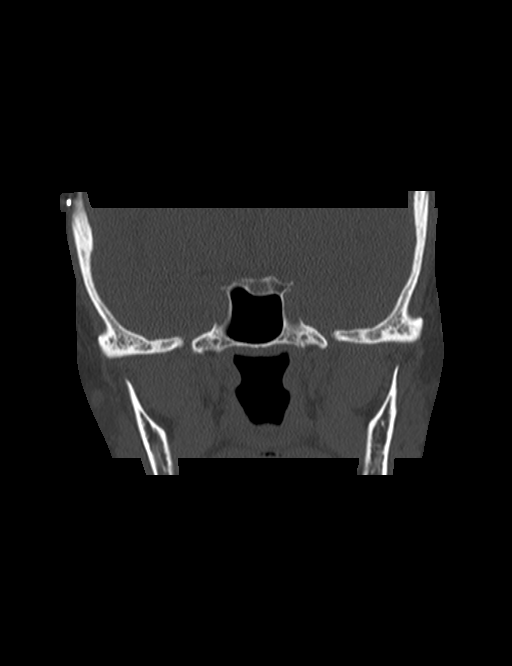

[Series 205: sag · sagittal · 0.36mm/px · 3 of 76 slices shown]
[im 26/76  bone]
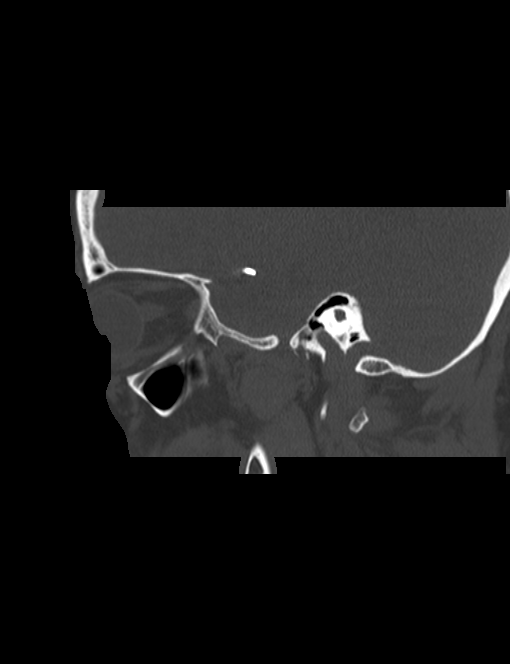
[im 38/76  bone]
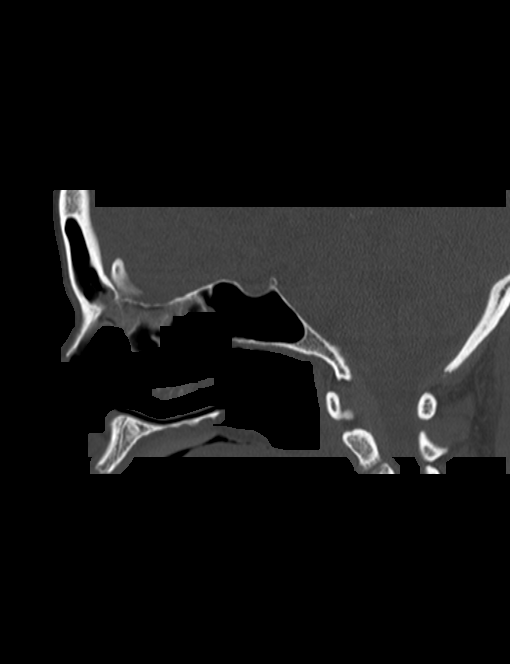
[im 51/76  bone]
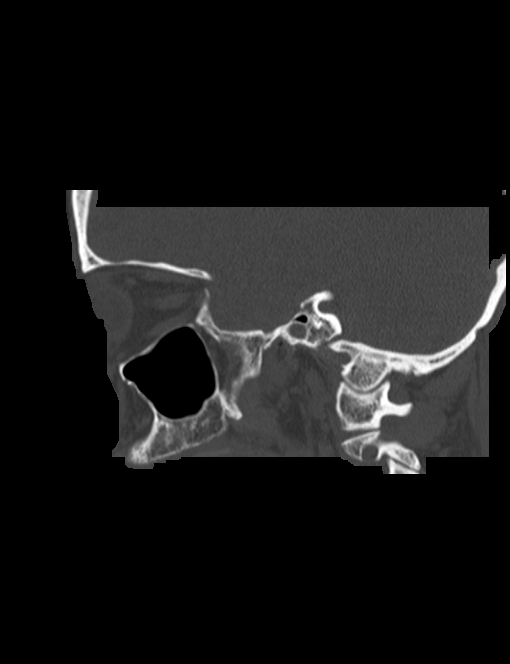

[15 of 47 positions shown; findings below may reference images not displayed]

FINDINGS: Osseous:

--Complex facial fracture types: No LeFort, zygomaticomaxillary
complex or nasoorbitoethmoidal fracture.

--Simple fracture types: None.

-- Visualized Mandible: No fracture or dislocation.

Orbits: The globes appear intact. Normal appearance of the intra-
and extraconal fat. Symmetric extraocular muscles.

Sinuses: No fluid levels or advanced mucosal thickening.

Soft tissues: Normal visualized extracranial soft tissues.

Limited intracranial: Status post right pterional craniotomy for
aneurysm clipping.
IMPRESSION: 1. No finding to explain the reported facial pain.
2. Clear paranasal sinuses and sinus drainage pathways.

## 2016-12-27 ENCOUNTER — Encounter: Payer: Self-pay | Admitting: Family Medicine

## 2016-12-27 ENCOUNTER — Ambulatory Visit (INDEPENDENT_AMBULATORY_CARE_PROVIDER_SITE_OTHER): Payer: BLUE CROSS/BLUE SHIELD | Admitting: Family Medicine

## 2016-12-27 ENCOUNTER — Ambulatory Visit (INDEPENDENT_AMBULATORY_CARE_PROVIDER_SITE_OTHER): Payer: BLUE CROSS/BLUE SHIELD | Admitting: Student

## 2016-12-27 VITALS — BP 126/60 | HR 70 | Temp 98.2°F | Resp 14 | Ht 59.0 in | Wt 161.0 lb

## 2016-12-27 VITALS — BP 144/70 | Ht 59.0 in | Wt 160.0 lb

## 2016-12-27 DIAGNOSIS — R7303 Prediabetes: Secondary | ICD-10-CM

## 2016-12-27 DIAGNOSIS — M545 Low back pain, unspecified: Secondary | ICD-10-CM | POA: Insufficient documentation

## 2016-12-27 DIAGNOSIS — Z720 Tobacco use: Secondary | ICD-10-CM

## 2016-12-27 DIAGNOSIS — M25511 Pain in right shoulder: Secondary | ICD-10-CM

## 2016-12-27 DIAGNOSIS — Z1211 Encounter for screening for malignant neoplasm of colon: Secondary | ICD-10-CM

## 2016-12-27 DIAGNOSIS — Z8679 Personal history of other diseases of the circulatory system: Secondary | ICD-10-CM

## 2016-12-27 DIAGNOSIS — I10 Essential (primary) hypertension: Secondary | ICD-10-CM | POA: Diagnosis not present

## 2016-12-27 DIAGNOSIS — M7540 Impingement syndrome of unspecified shoulder: Secondary | ICD-10-CM | POA: Diagnosis not present

## 2016-12-27 DIAGNOSIS — M25512 Pain in left shoulder: Secondary | ICD-10-CM

## 2016-12-27 DIAGNOSIS — Z8669 Personal history of other diseases of the nervous system and sense organs: Secondary | ICD-10-CM

## 2016-12-27 MED ORDER — METAXALONE 800 MG PO TABS: 800.0000 mg | ORAL_TABLET | Freq: Two times a day (BID) | ORAL | 0 refills | Status: DC | PRN

## 2016-12-27 MED ORDER — MELOXICAM 15 MG PO TABS: 15.0000 mg | ORAL_TABLET | Freq: Every day | ORAL | 2 refills | Status: DC

## 2016-12-27 MED ORDER — AMLODIPINE BESYLATE 5 MG PO TABS: 5.0000 mg | ORAL_TABLET | Freq: Every day | ORAL | 5 refills | Status: DC

## 2016-12-27 MED FILL — MELOXICAM 15 MG TABLET: 15 | 30 days supply | Qty: 30 | Fill #0

## 2016-12-27 MED FILL — AMLODIPINE BESYLATE 5 MG TA: 5 | 30 days supply | Qty: 30 | Fill #1

## 2016-12-27 NOTE — Progress Notes (Signed)
Subjective:    Patient ID: Renee Murray, female    DOB: 12/01/1956, 61 y.o.   MRN: [id number]  HPI [name], a 61 year old female with a history of a brain aneurysm, hypertension, and prediabetes presents for a follow up of hypertension.  She is not exercising and is not adherent to low salt diet. She does not check blood pressure at home.  Patient denies chest pain, dyspnea, fatigue, orthopnea, palpitations, syncope and tachypnea.  Cardiovascular risk factors include: obesity (BMI >= 30 kg/m2) and sedentary lifestyle.  Past Medical History:  Diagnosis Date  . Brain aneurysm   . Hypertension    Social History   Social History  . Marital status: Divorced    Spouse name: N/A  . Number of children: N/A  . Years of education: N/A   Occupational History  . Not on file.   Social History Main Topics  . Smoking status: Current Every Day Smoker    Packs/day: 0.25    Years: 20.00    Types: Cigarettes  . Smokeless tobacco: Never Used  . Alcohol use No  . Drug use: No  . Sexual activity: Not on file   Other Topics Concern  . Not on file   Social History Narrative  . No narrative on file   Immunization History  Administered Date(s) Administered  . Influenza,inj,Quad PF,36+ Mos 09/13/2015  . Tdap 07/26/2016   Allergies  Allergen Reactions  . Demerol Other (See Comments)    "almost died"  . Morphine And Related Other (See Comments)    "Almost Died"   Review of Systems  Constitutional: Negative for fatigue, fever and unexpected weight change.  HENT: Negative for tinnitus.   Eyes: Negative for visual disturbance.  Respiratory: Negative for chest tightness.   Cardiovascular: Negative for chest pain and palpitations.  Gastrointestinal: Negative for constipation, diarrhea and nausea.  Endocrine: Negative for cold intolerance, heat intolerance, polydipsia, polyphagia and polyuria.  Genitourinary: Negative.   Musculoskeletal: Positive for back pain and myalgias.   Skin: Negative.   Neurological: Positive for numbness (numbness and tingling to right upper extremity). Negative for dizziness, tremors, facial asymmetry, weakness and headaches.  Psychiatric/Behavioral: Negative for agitation and suicidal ideas. The patient is not nervous/anxious.        Objective:   Physical Exam  Constitutional: She appears well-developed and well-nourished.  HENT:  Head: Normocephalic and atraumatic.  Right Ear: External ear normal.  Left Ear: External ear normal.  Mouth/Throat: Oropharynx is clear and moist.  Eyes: Conjunctivae and EOM are normal. Pupils are equal, round, and reactive to light.  Neck: Trachea normal and normal range of motion.  Cardiovascular: Normal rate, regular rhythm, normal heart sounds and normal pulses.   Pulmonary/Chest: Effort normal and breath sounds normal.  Abdominal: Soft. Normal appearance and bowel sounds are normal.  Musculoskeletal:       Left shoulder: She exhibits normal range of motion and no tenderness.       Right wrist: She exhibits decreased range of motion and tenderness.  Lymphadenopathy:       Head (right side): No submental and no submandibular adenopathy present.       Head (left side): No submental and no submandibular adenopathy present.  Neurological: She has normal strength. No cranial nerve deficit or sensory deficit.  Psychiatric: She has a normal mood and affect. Her speech is normal and behavior is normal. Judgment and thought content normal. Cognition and memory are normal. She expresses no homicidal and no  suicidal ideation.      BP 126/60 (BP Location: Right Arm, Patient Position: Sitting, Cuff Size: Normal)   Pulse 70   Temp 98.2 F (36.8 C) (Oral)   Resp 14   Ht 4\' 11"  (1.499 m)   Wt 161 lb (73 kg)   SpO2 100%   BMI 32.52 kg/m  Assessment & Plan:  1. Essential hypertension Blood pressure is at goal on medication regimen. Will continue Norvasc - amLODipine (NORVASC) 5 MG tablet; Take 1 tablet  (5 mg total) by mouth daily.  Dispense: 30 tablet; Refill: 5 - Ambulatory referral to Ophthalmology - Ambulatory referral to Ophthalmology - COMPLETE METABOLIC PANEL WITH GFR; Future - Urinalysis, Complete; Future  2. Prediabetes Recommend a lowfat, low carbohydrate diet divided over 5-6 small meals, increase water intake to 6-8 glasses, and 150 minutes per week of cardiovascular exercise.    3. History of aneurysm Follow up with neurology as previously scheduled.  - Ambulatory referral to Ophthalmology  4. History of blurred vision - Ambulatory referral to Ophthalmology  5. Tobacco abuse Smoking cessation instruction/counseling given:  counseled patient on the dangers of tobacco use, advised patient to stop smoking, and reviewed strategies to maximize success   6. Colon cancer screening - Ambulatory referral to Gastroenterology    RTC: Return to office on 12/30/2015 for fasting labs 3 months for hypertension      Renee Maroon, FNP

## 2016-12-27 NOTE — Patient Instructions (Addendum)
Return to office on 12/30/2015 for fasting labs.     Hypertension Hypertension, commonly called high blood pressure, is when the force of blood pumping through your arteries is too strong. Your arteries are the blood vessels that carry blood from your heart throughout your body. A blood pressure reading consists of a higher number over a lower number, such as 110/72. The higher number (systolic) is the pressure inside your arteries when your heart pumps. The lower number (diastolic) is the pressure inside your arteries when your heart relaxes. Ideally you want your blood pressure below 120/80. Hypertension forces your heart to work harder to pump blood. Your arteries may become narrow or stiff. Having untreated or uncontrolled hypertension can cause heart attack, stroke, kidney disease, and other problems. What increases the risk? Some risk factors for high blood pressure are controllable. Others are not. Risk factors you cannot control include:  Race. You may be at higher risk if you are African American.  Age. Risk increases with age.  Gender. Men are at higher risk than women before age 79 years. After age 101, women are at higher risk than men. Risk factors you can control include:  Not getting enough exercise or physical activity.  Being overweight.  Getting too much fat, sugar, calories, or salt in your diet.  Drinking too much alcohol. What are the signs or symptoms? Hypertension does not usually cause signs or symptoms. Extremely high blood pressure (hypertensive crisis) may cause headache, anxiety, shortness of breath, and nosebleed. How is this diagnosed? To check if you have hypertension, your health care provider will measure your blood pressure while you are seated, with your arm held at the level of your heart. It should be measured at least twice using the same arm. Certain conditions can cause a difference in blood pressure between your right and left arms. A blood  pressure reading that is higher than normal on one occasion does not mean that you need treatment. If it is not clear whether you have high blood pressure, you may be asked to return on a different day to have your blood pressure checked again. Or, you may be asked to monitor your blood pressure at home for 1 or more weeks. How is this treated? Treating high blood pressure includes making lifestyle changes and possibly taking medicine. Living a healthy lifestyle can help lower high blood pressure. You may need to change some of your habits. Lifestyle changes may include:  Following the DASH diet. This diet is high in fruits, vegetables, and whole grains. It is low in salt, red meat, and added sugars.  Keep your sodium intake below 2,300 mg per day.  Getting at least 30-45 minutes of aerobic exercise at least 4 times per week.  Losing weight if necessary.  Not smoking.  Limiting alcoholic beverages.  Learning ways to reduce stress. Your health care provider may prescribe medicine if lifestyle changes are not enough to get your blood pressure under control, and if one of the following is true:  You are 42-76 years of age and your systolic blood pressure is above 140.  You are 32 years of age or older, and your systolic blood pressure is above 150.  Your diastolic blood pressure is above 90.  You have diabetes, and your systolic blood pressure is over 140 or your diastolic blood pressure is over 90.  You have kidney disease and your blood pressure is above 140/90.  You have heart disease and your blood pressure is above  140/90. Your personal target blood pressure may vary depending on your medical conditions, your age, and other factors. Follow these instructions at home:  Have your blood pressure rechecked as directed by your health care provider.  Take medicines only as directed by your health care provider. Follow the directions carefully. Blood pressure medicines must be taken as  prescribed. The medicine does not work as well when you skip doses. Skipping doses also puts you at risk for problems.  Do not smoke.  Monitor your blood pressure at home as directed by your health care provider. Contact a health care provider if:  You think you are having a reaction to medicines taken.  You have recurrent headaches or feel dizzy.  You have swelling in your ankles.  You have trouble with your vision. Get help right away if:  You develop a severe headache or confusion.  You have unusual weakness, numbness, or feel faint.  You have severe chest or abdominal pain.  You vomit repeatedly.  You have trouble breathing. This information is not intended to replace advice given to you by your health care provider. Make sure you discuss any questions you have with your health care provider. Document Released: 12/04/2005 Document Revised: 05/11/2016 Document Reviewed: 09/26/2013 Elsevier Interactive Patient Education  2017 [employer]

## 2016-12-27 NOTE — Assessment & Plan Note (Signed)
Having sciatic nerve pain likely. Offered MRI to patient to assess for disc Z and nerve impingement. She'll like to hold off on that and try anti-inflammatories also relaxer. She will also try physical therapy. She'll follow-up in 3-4 weeks.
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
## 2016-12-27 NOTE — Assessment & Plan Note (Addendum)
This is present bilaterally. Gave exercises for patient to do at home. We'll do motivated for pain. Also placed her in physical therapy. I offered injections but the patient would like to try these first and will return if she would like to pursue injection therapy.

## 2016-12-29 ENCOUNTER — Other Ambulatory Visit: Payer: Self-pay

## 2017-01-02 ENCOUNTER — Ambulatory Visit: Payer: BLUE CROSS/BLUE SHIELD | Admitting: Physical Therapy

## 2017-01-11 ENCOUNTER — Encounter: Payer: Self-pay | Admitting: Physical Therapy

## 2017-01-11 ENCOUNTER — Ambulatory Visit: Payer: BLUE CROSS/BLUE SHIELD | Attending: Family Medicine | Admitting: Physical Therapy

## 2017-01-11 DIAGNOSIS — M79604 Pain in right leg: Secondary | ICD-10-CM | POA: Diagnosis present

## 2017-01-11 DIAGNOSIS — M6281 Muscle weakness (generalized): Secondary | ICD-10-CM | POA: Diagnosis present

## 2017-01-11 DIAGNOSIS — M542 Cervicalgia: Secondary | ICD-10-CM

## 2017-01-11 DIAGNOSIS — R262 Difficulty in walking, not elsewhere classified: Secondary | ICD-10-CM | POA: Diagnosis present
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
## 2017-01-15 ENCOUNTER — Ambulatory Visit: Payer: BLUE CROSS/BLUE SHIELD | Admitting: Physical Therapy

## 2017-01-15 ENCOUNTER — Encounter: Payer: Self-pay | Admitting: Physical Therapy

## 2017-01-15 DIAGNOSIS — M6281 Muscle weakness (generalized): Secondary | ICD-10-CM

## 2017-01-15 DIAGNOSIS — M79604 Pain in right leg: Secondary | ICD-10-CM

## 2017-01-15 DIAGNOSIS — M542 Cervicalgia: Secondary | ICD-10-CM | POA: Diagnosis not present

## 2017-01-15 DIAGNOSIS — R262 Difficulty in walking, not elsewhere classified: Secondary | ICD-10-CM
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
## 2017-01-22 ENCOUNTER — Ambulatory Visit: Payer: BLUE CROSS/BLUE SHIELD | Attending: Family Medicine | Admitting: Physical Therapy

## 2017-01-22 DIAGNOSIS — M6281 Muscle weakness (generalized): Secondary | ICD-10-CM | POA: Insufficient documentation

## 2017-01-22 DIAGNOSIS — M542 Cervicalgia: Secondary | ICD-10-CM | POA: Insufficient documentation

## 2017-01-22 DIAGNOSIS — R262 Difficulty in walking, not elsewhere classified: Secondary | ICD-10-CM | POA: Insufficient documentation

## 2017-01-22 DIAGNOSIS — M79604 Pain in right leg: Secondary | ICD-10-CM | POA: Insufficient documentation

## 2017-01-24 ENCOUNTER — Ambulatory Visit: Payer: BLUE CROSS/BLUE SHIELD | Admitting: Student

## 2017-01-25 ENCOUNTER — Ambulatory Visit: Payer: BLUE CROSS/BLUE SHIELD | Admitting: Physical Therapy

## 2017-01-25 ENCOUNTER — Telehealth: Payer: Self-pay | Admitting: Physical Therapy

## 2017-01-25 NOTE — Telephone Encounter (Signed)
Left message at pt mobile number. Told her she missed two appointments this week. Asked her to call us back and if she does not by the end of the day, apts will be cancelled and she will have to schedule one at a time.  Malana Eberwein C. Suki Crockett PT, DPT 01/25/17 10:56 AM

## 2017-01-29 ENCOUNTER — Ambulatory Visit: Payer: BLUE CROSS/BLUE SHIELD | Admitting: Physical Therapy

## 2017-01-29 ENCOUNTER — Encounter: Payer: Self-pay | Admitting: Family Medicine

## 2017-02-01 ENCOUNTER — Ambulatory Visit: Payer: BLUE CROSS/BLUE SHIELD | Admitting: Physical Therapy

## 2017-02-02 ENCOUNTER — Ambulatory Visit: Payer: BLUE CROSS/BLUE SHIELD | Admitting: Physical Therapy

## 2017-02-02 ENCOUNTER — Encounter: Payer: Self-pay | Admitting: Physical Therapy

## 2017-02-02 DIAGNOSIS — M79604 Pain in right leg: Secondary | ICD-10-CM | POA: Diagnosis present

## 2017-02-02 DIAGNOSIS — R262 Difficulty in walking, not elsewhere classified: Secondary | ICD-10-CM | POA: Diagnosis present

## 2017-02-02 DIAGNOSIS — M542 Cervicalgia: Secondary | ICD-10-CM

## 2017-02-02 DIAGNOSIS — M6281 Muscle weakness (generalized): Secondary | ICD-10-CM | POA: Diagnosis present

## 2017-02-02 NOTE — Therapy (Signed)
[facility] Outpatient Rehabilitation [facility] [address] [place], [place], [address] Phone: [phone]   Fax:  [phone]  Physical Therapy Treatment  Patient Details  Name: Renee Murray MRN: [id number] Date of Birth: Mar 14, 1956 Referring Provider: Cardell Peach, MD  Encounter Date: 02/02/2017      PT End of Session - 02/02/17 1059    Visit Number 3   Number of Visits 16   Date for PT Re-Evaluation 03/11/17   Authorization Type BCBS   PT Start Time 1059   PT Stop Time 1144   PT Time Calculation (min) 45 min   Activity Tolerance Patient tolerated treatment well   Behavior During Therapy [facility] for tasks assessed/performed      Past Medical History:  Diagnosis Date  . Brain aneurysm   . Hypertension     Past Surgical History:  Procedure Laterality Date  . ABDOMINAL HYSTERECTOMY    . CHOLECYSTECTOMY    . CRANIOTOMY Right 02/04/2015   Procedure: RIGHT CRANIOTOMY FOR ANEURYSM ;  Surgeon: Lisbeth Renshaw, MD;  Location: [facility];  Service: Neurosurgery;  Laterality: Right;  . IR GENERIC HISTORICAL  02/04/2015   IR ANGIO INTRA EXTRACRAN SEL COM CAROTID INNOMINATE BILAT MOD SED 02/04/2015 Lisbeth Renshaw, MD [facility]  . IR GENERIC HISTORICAL  02/04/2015   IR ANGIO VERTEBRAL SEL SUBCLAVIAN INNOMINATE UNI R MOD SED 02/04/2015 Lisbeth Renshaw, MD [facility]  . IR GENERIC HISTORICAL  02/04/2015   IR ANGIO VERTEBRAL SEL VERTEBRAL UNI L MOD SED 02/04/2015 Lisbeth Renshaw, MD [facility]    There were no vitals filed for this visit.      Subjective Assessment - 02/02/17 1059    Subjective pt reports being very ill so she could not come to appts. Reports daughter was helpful in making her do exercises. Still getting stiffness on R side of neck, left her good pillow in [place] and has to find another good one for neck support. Reports leg is coming along a lot better. Has not noticed swelling in leg.    Patient Stated Goals I want to be able  to garden again. Move without pain   Currently in Pain? Yes   Pain Score 4    Pain Location Neck   Pain Orientation Right   Pain Descriptors / Indicators Discomfort   Aggravating Factors  cervical ROM   Pain Relieving Factors rest   Pain Score 0                         [facility] Adult PT Treatment/Exercise - 02/02/17 0001      Exercises   Exercises Neck     Neck Exercises: Machines for Strengthening   UBE (Upper Arm Bike) retro 4 min L1     Knee/Hip Exercises: Aerobic   Nustep 4 min L3     Knee/Hip Exercises: Supine   Other Supine Knee/Hip Exercises posterior pelvic tilt     Shoulder Exercises: Supine   Horizontal ABduction 15 reps   Theraband Level (Shoulder Horizontal ABduction) Level 1 (Yellow)     Manual Therapy   Manual therapy comments edu in use of theracane     Neck Exercises: Stretches   Other Neck Stretches door pec stretch                PT Education - 02/02/17 1109    Education provided Yes   Education Details exercise form/rationale, HEP, theracane and trigger points, posture and alignment effects  Person(s) Educated Patient   Methods Explanation;Demonstration;Tactile cues;Verbal cues;Handout   Comprehension Verbalized understanding;Returned demonstration;Verbal cues required;Tactile cues required;Need further instruction          PT Short Term Goals - 01/11/17 1400      PT SHORT TERM GOAL #1   Title pt to be independent with her HEP.    Time 4   Period Weeks   Status New     PT SHORT TERM GOAL #2   Title Pt will be able to perform sit to stand with single UE support in less than 5 seconds in order to improve functional mobility.    Baseline requires bilateral UE support and increased time   Time 4   Period Weeks   Status New           PT Long Term Goals - 01/11/17 1401      PT LONG TERM GOAL #1   Title Pt will be able to perform 5 times sit to stand in </= 20 seconds in order to improve functional mobilty.     Baseline 78 seconds on 01/11/17 using bilateral UE"s   Time 8   Period Weeks   Status New     PT LONG TERM GOAL #2   Title Pt will improve R UE shoulder flexion to >/= 4/5 in order to improve ADL's   Baseline -3/5   Time 8   Period Weeks   Status New     PT LONG TERM GOAL #3   Title Pt will improve bilateral hip flexion and abduction to >/= 4/5 in order to improve gait and functional mobility.    Time 8   Period Weeks     PT LONG TERM GOAL #4   Title Pt will be able to walk 300 feet with step through gait pattern with equalized step length complaining of no pain.    Time 8   Period Weeks     PT LONG TERM GOAL #5   Title pt will improve bilateral cervical rotation to >/= 60 degrees in order to improve driving safety and function.    Baseline R: 45 degrees  L: 50 degrees   Time 8   Period Weeks   Status New               Plan - 02/02/17 1148    Clinical Impression Statement Pt tolerated exercises well and was able to perform with good form after moderate cuing. continues to demo GHJ elevation initially with exercises but relaxes with cuing. will do 2 more weeks of PT, believed insurance may be cut off on 3/1   PT Next Visit Plan Cervical ROM, postural endurance   PT Home Exercise Plan Upper trap stretch, cervical retraction, heel sides, LAQ, scapular retraction, heel strike and arm swing in gait, cervical retraction, pelvic tilt, door pec stretch   Consulted and Agree with Plan of Care Patient      Patient will benefit from skilled therapeutic intervention in order to improve the following deficits and impairments:     Visit Diagnosis: Acute neck pain  Muscle weakness (generalized)  Pain of right lower extremity  Difficulty in walking, not elsewhere classified     Problem List Patient Active Problem List   Diagnosis Date Noted  . Acute midline low back pain 12/27/2016  . Acute pain of both shoulders 12/27/2016  . Rotator cuff impingement syndrome  12/27/2016  . Essential hypertension 01/25/2016  . Chronic pain syndrome 01/25/2016  . Prediabetes 10/13/2015  .  Anxiety about health 09/13/2015  . Neuropathy (HCC) 09/13/2015  . Chronic generalized pain 09/13/2015  . History of seasonal allergies 09/13/2015  . SAH (subarachnoid hemorrhage) (HCC)   . Subarachnoid hemorrhage (HCC) 02/04/2015  . History of sinus disease on CT 08/10/2014  . Hypertension, goal below 150/90 03/17/2014  . Health care maintenance 03/17/2014  . Tobacco abuse 03/17/2014  . Neuropathy, upper extremity bilaterally  08/06/2013    Natoshia Souter C. Ottilie Wigglesworth PT, DPT 02/02/17 11:52 AM   [facility] Outpatient Rehabilitation [facility] [address] [place], [place], [address] Phone: [phone]   Fax:  [phone]  Name: Renee Murray MRN: [id number] Date of Birth: [birth year]

## 2017-02-05 ENCOUNTER — Ambulatory Visit: Payer: BLUE CROSS/BLUE SHIELD | Admitting: Physical Therapy

## 2017-02-06 ENCOUNTER — Ambulatory Visit: Payer: BLUE CROSS/BLUE SHIELD | Admitting: Physical Therapy

## 2017-02-06 DIAGNOSIS — M542 Cervicalgia: Secondary | ICD-10-CM | POA: Diagnosis not present

## 2017-02-06 DIAGNOSIS — M79604 Pain in right leg: Secondary | ICD-10-CM

## 2017-02-06 DIAGNOSIS — M6281 Muscle weakness (generalized): Secondary | ICD-10-CM

## 2017-02-06 DIAGNOSIS — R262 Difficulty in walking, not elsewhere classified: Secondary | ICD-10-CM

## 2017-02-06 NOTE — Therapy (Signed)
[facility] Outpatient Rehabilitation [facility] [address] [place], [place], [address] Phone: [phone]   Fax:  [phone]  Physical Therapy Treatment  Patient Details  Name: Renee Murray MRN: [id number] Date of Birth: 05/19/1956 Referring Provider: Cardell Peach, MD  Encounter Date: 02/06/2017      PT End of Session - 02/06/17 1316    Visit Number 4   Number of Visits 16   Date for PT Re-Evaluation 03/11/17   PT Start Time 1102   PT Stop Time 1155   PT Time Calculation (min) 53 min   Activity Tolerance Patient tolerated treatment well   Behavior During Therapy [facility] for tasks assessed/performed      Past Medical History:  Diagnosis Date  . Brain aneurysm   . Hypertension     Past Surgical History:  Procedure Laterality Date  . ABDOMINAL HYSTERECTOMY    . CHOLECYSTECTOMY    . CRANIOTOMY Right 02/04/2015   Procedure: RIGHT CRANIOTOMY FOR ANEURYSM ;  Surgeon: Lisbeth Renshaw, MD;  Location: [facility];  Service: Neurosurgery;  Laterality: Right;  . IR GENERIC HISTORICAL  02/04/2015   IR ANGIO INTRA EXTRACRAN SEL COM CAROTID INNOMINATE BILAT MOD SED 02/04/2015 Lisbeth Renshaw, MD [facility]  . IR GENERIC HISTORICAL  02/04/2015   IR ANGIO VERTEBRAL SEL SUBCLAVIAN INNOMINATE UNI R MOD SED 02/04/2015 Lisbeth Renshaw, MD [facility]  . IR GENERIC HISTORICAL  02/04/2015   IR ANGIO VERTEBRAL SEL VERTEBRAL UNI L MOD SED 02/04/2015 Lisbeth Renshaw, MD [facility]    There were no vitals filed for this visit.      Subjective Assessment - 02/06/17 1113    Subjective I was at the mall yesterday around 2 hours.  My right leg swelled again.  We ate and walked in [place].   Iwas very fatigued.    Currently in Pain? Yes   Pain Score 4    Pain Location Neck   Pain Orientation Right   Pain Descriptors / Indicators Shooting   Pain Type Acute pain   Pain Radiating Towards shoulder, head   Aggravating Factors  sitting   Pain Relieving Factors  Standing outside in the cold with her bare feet on concrete,  meds   Effect of Pain on Daily Activities ADL difficuly   Pain Location --  Leg swollen,  no mention of pain today.                         [facility] Adult PT Treatment/Exercise - 02/06/17 0001      Self-Care   Self-Care Posture   Posture sitting, supine posture ed,  supine to sit transfer education,  multiple questions answered muscle chart used for education,  patient verbalized understanding.  Foot stool suggested ,  rolled towels suggested.  and more,.     Neck Exercises: Machines for Strengthening   UBE (Upper Arm Bike) retro 6 min L1   Other Machines for Strengthening Nu step 2 minutes     Shoulder Exercises: Supine   Other Supine Exercises Decompression 5 minutes,  shoulder press,  head press, added to HEP,  multiple cues needed ,pain decreased                 PT Education - 02/06/17 1312    Education provided Yes   Education Details HEP,  Sitting posture,  sleeping posture,  supine to sit transfer technique   Person(s) Educated Patient   Methods Explanation;Demonstration;Verbal cues;Handout   Comprehension Verbalized  understanding;Returned demonstration;Need further instruction  ADL's          PT Short Term Goals - 02/06/17 1324      PT SHORT TERM GOAL #1   Title pt to be independent with her HEP.    Baseline cues needed with today's exercises   Time 4   Period Weeks   Status On-going     PT SHORT TERM GOAL #2   Title Pt will be able to perform sit to stand with single UE support in less than 5 seconds in order to improve functional mobility.    Baseline requires bilateral UE support and increased time   Time 4   Period Weeks   Status On-going           PT Long Term Goals - 01/11/17 1401      PT LONG TERM GOAL #1   Title Pt will be able to perform 5 times sit to stand in </= 20 seconds in order to improve functional mobilty.    Baseline 78 seconds on 01/11/17 using  bilateral UE"s   Time 8   Period Weeks   Status New     PT LONG TERM GOAL #2   Title Pt will improve R UE shoulder flexion to >/= 4/5 in order to improve ADL's   Baseline -3/5   Time 8   Period Weeks   Status New     PT LONG TERM GOAL #3   Title Pt will improve bilateral hip flexion and abduction to >/= 4/5 in order to improve gait and functional mobility.    Time 8   Period Weeks     PT LONG TERM GOAL #4   Title Pt will be able to walk 300 feet with step through gait pattern with equalized step length complaining of no pain.    Time 8   Period Weeks     PT LONG TERM GOAL #5   Title pt will improve bilateral cervical rotation to >/= 60 degrees in order to improve driving safety and function.    Baseline R: 45 degrees  L: 50 degrees   Time 8   Period Weeks   Status New               Plan - 02/06/17 1316    Clinical Impression Statement Patient able to make progress toward her HEP goals.  She worries about swelling in her leg,  She worries when the pain in her neck increases.  Education continued today with posture sitting and pain.  Muscles in neck were able to relax in decompression position with pillows and rolled towel.  She needs intermittant re-direction to keep on task.     PT Next Visit Plan Cervical ROM, postural endurance,  ADL handout .  review decompression exercises.     PT Home Exercise Plan Upper trap stretch, cervical retraction, heel sides, LAQ, scapular retraction, heel strike and arm swing in gait, cervical retraction, pelvic tilt, door pec stretch,  head press, shoulder press, decompression.     Consulted and Agree with Plan of Care Patient      Patient will benefit from skilled therapeutic intervention in order to improve the following deficits and impairments:  Pain, Postural dysfunction, Decreased balance, Difficulty walking, Decreased activity tolerance, Decreased range of motion, Decreased strength, Impaired perceived functional ability, Impaired  UE functional use, Decreased mobility, Abnormal gait  Visit Diagnosis: Acute neck pain  Muscle weakness (generalized)  Pain of right lower extremity  Difficulty in  walking, not elsewhere classified     Problem List Patient Active Problem List   Diagnosis Date Noted  . Acute midline low back pain 12/27/2016  . Acute pain of both shoulders 12/27/2016  . Rotator cuff impingement syndrome 12/27/2016  . Essential hypertension 01/25/2016  . Chronic pain syndrome 01/25/2016  . Prediabetes 10/13/2015  . Anxiety about health 09/13/2015  . Neuropathy (HCC) 09/13/2015  . Chronic generalized pain 09/13/2015  . History of seasonal allergies 09/13/2015  . SAH (subarachnoid hemorrhage) (HCC)   . Subarachnoid hemorrhage (HCC) 02/04/2015  . History of sinus disease on CT 08/10/2014  . Hypertension, goal below 150/90 03/17/2014  . Health care maintenance 03/17/2014  . Tobacco abuse 03/17/2014  . Neuropathy, upper extremity bilaterally  08/06/2013    Mattingly Fountaine PTA 02/06/2017, 1:26 PM  [facility] [address] [place], [place], [address] Phone: [phone]   Fax:  [phone]  Name: Renee Murray MRN: [id number] Date of Birth: 09/02/1956

## 2017-02-06 NOTE — Patient Instructions (Addendum)
Issued from exercise drawer: Decompression 5-15 minutes Shoulder press 5 x 5 seconds Head press 5 x 5 seconds Daily

## 2017-02-08 ENCOUNTER — Encounter: Payer: Self-pay | Admitting: Physical Therapy

## 2017-02-08 ENCOUNTER — Ambulatory Visit: Payer: BLUE CROSS/BLUE SHIELD | Admitting: Physical Therapy

## 2017-02-08 DIAGNOSIS — M542 Cervicalgia: Secondary | ICD-10-CM

## 2017-02-08 DIAGNOSIS — M6281 Muscle weakness (generalized): Secondary | ICD-10-CM

## 2017-02-08 DIAGNOSIS — R262 Difficulty in walking, not elsewhere classified: Secondary | ICD-10-CM

## 2017-02-08 DIAGNOSIS — M79604 Pain in right leg: Secondary | ICD-10-CM

## 2017-02-08 MED FILL — GABAPENTIN 300 MG CAPSULE: 300 | 30 days supply | Qty: 90 | Fill #0

## 2017-02-08 MED FILL — AMLODIPINE BESYLATE 5 MG TA: 5 | 30 days supply | Qty: 30 | Fill #2

## 2017-02-08 NOTE — Therapy (Signed)
[facility] [address] [place], [place], [address] Phone: [phone]   Fax:  [phone]  Physical Therapy Treatment  Patient Details  Name: Renee Murray MRN: [id number] Date of Birth: 04-29-1956 Referring Provider: Cardell Peach, MD  Encounter Date: 02/08/2017      PT End of Session - 02/08/17 0901    Visit Number 5   Number of Visits 16   Date for PT Re-Evaluation 03/11/17   Authorization Type BCBS   PT Start Time 0901   PT Stop Time 0942   PT Time Calculation (min) 41 min   Activity Tolerance Patient tolerated treatment well   Behavior During Therapy [facility] for tasks assessed/performed      Past Medical History:  Diagnosis Date  . Brain aneurysm   . Hypertension     Past Surgical History:  Procedure Laterality Date  . ABDOMINAL HYSTERECTOMY    . CHOLECYSTECTOMY    . CRANIOTOMY Right 02/04/2015   Procedure: RIGHT CRANIOTOMY FOR ANEURYSM ;  Surgeon: Lisbeth Renshaw, MD;  Location: [facility];  Service: Neurosurgery;  Laterality: Right;  . IR GENERIC HISTORICAL  02/04/2015   IR ANGIO INTRA EXTRACRAN SEL COM CAROTID INNOMINATE BILAT MOD SED 02/04/2015 Lisbeth Renshaw, MD [facility]  . IR GENERIC HISTORICAL  02/04/2015   IR ANGIO VERTEBRAL SEL SUBCLAVIAN INNOMINATE UNI R MOD SED 02/04/2015 Lisbeth Renshaw, MD [facility]  . IR GENERIC HISTORICAL  02/04/2015   IR ANGIO VERTEBRAL SEL VERTEBRAL UNI L MOD SED 02/04/2015 Lisbeth Renshaw, MD [facility]    There were no vitals filed for this visit.      Subjective Assessment - 02/08/17 0904    Subjective Was outside yesterday maybe a little longer than I shoulde have been, feels sore today    Patient Stated Goals I want to be able to garden again. Move without pain   Pain Location Leg   Pain Orientation Right   Pain Descriptors / Indicators Sore                         [facility] Adult PT Treatment/Exercise - 02/08/17 0001      Knee/Hip Exercises: Stretches   Passive Hamstring Stretch Both;2 reps;30 seconds   Passive Hamstring Stretch Limitations supine with green strap   Other Knee/Hip Stretches figure 4 bilat pull and push 30s holds   Other Knee/Hip Stretches door QL stretch     Knee/Hip Exercises: Aerobic   Nustep L5 5 min     Manual Therapy   Manual Therapy Myofascial release   Manual therapy comments guided through use of tennis ball for trigger point release   Myofascial Release trigger point release in R piriformis                PT Education - 02/08/17 0907    Education provided Yes   Education Details soreness with increased activities. exercise form/rationale, used pictures to educate on anatomy of condition   Person(s) Educated Patient   Methods Explanation;Demonstration;Tactile cues;Verbal cues   Comprehension Verbalized understanding;Returned demonstration;Verbal cues required;Tactile cues required;Need further instruction          PT Short Term Goals - 02/06/17 1324      PT SHORT TERM GOAL #1   Title pt to be independent with her HEP.    Baseline cues needed with today's exercises   Time 4   Period Weeks   Status On-going     PT SHORT TERM GOAL #2  Title Pt will be able to perform sit to stand with single UE support in less than 5 seconds in order to improve functional mobility.    Baseline requires bilateral UE support and increased time   Time 4   Period Weeks   Status On-going           PT Long Term Goals - 01/11/17 1401      PT LONG TERM GOAL #1   Title Pt will be able to perform 5 times sit to stand in </= 20 seconds in order to improve functional mobilty.    Baseline 78 seconds on 01/11/17 using bilateral UE"s   Time 8   Period Weeks   Status New     PT LONG TERM GOAL #2   Title Pt will improve R UE shoulder flexion to >/= 4/5 in order to improve ADL's   Baseline -3/5   Time 8   Period Weeks   Status New     PT LONG TERM GOAL #3   Title Pt will  improve bilateral hip flexion and abduction to >/= 4/5 in order to improve gait and functional mobility.    Time 8   Period Weeks     PT LONG TERM GOAL #4   Title Pt will be able to walk 300 feet with step through gait pattern with equalized step length complaining of no pain.    Time 8   Period Weeks     PT LONG TERM GOAL #5   Title pt will improve bilateral cervical rotation to >/= 60 degrees in order to improve driving safety and function.    Baseline R: 45 degrees  L: 50 degrees   Time 8   Period Weeks   Status New               Plan - 02/08/17 0943    Clinical Impression Statement Utilized stretching and tennis ball to decrease soreness and "bee sting" sensation in hip. Pt reported feeling much better after treatment. Heavy education so pt understands why she is having pain and rationale for stretches.    PT Next Visit Plan postural endurance, d/c end of week.    PT Home Exercise Plan Upper trap stretch, cervical retraction, heel sides, LAQ, scapular retraction, heel strike and arm swing in gait, cervical retraction, pelvic tilt, door pec stretch,  head press, shoulder press, decompression.     Consulted and Agree with Plan of Care Patient      Patient will benefit from skilled therapeutic intervention in order to improve the following deficits and impairments:     Visit Diagnosis: Acute neck pain  Muscle weakness (generalized)  Pain of right lower extremity  Difficulty in walking, not elsewhere classified     Problem List Patient Active Problem List   Diagnosis Date Noted  . Acute midline low back pain 12/27/2016  . Acute pain of both shoulders 12/27/2016  . Rotator cuff impingement syndrome 12/27/2016  . Essential hypertension 01/25/2016  . Chronic pain syndrome 01/25/2016  . Prediabetes 10/13/2015  . Anxiety about health 09/13/2015  . Neuropathy (HCC) 09/13/2015  . Chronic generalized pain 09/13/2015  . History of seasonal allergies 09/13/2015  .  SAH (subarachnoid hemorrhage) (HCC)   . Subarachnoid hemorrhage (HCC) 02/04/2015  . History of sinus disease on CT 08/10/2014  . Hypertension, goal below 150/90 03/17/2014  . Health care maintenance 03/17/2014  . Tobacco abuse 03/17/2014  . Neuropathy, upper extremity bilaterally  08/06/2013   Lena Fieldhouse C. Ranger Petrich  PT, DPT 02/08/17 9:45 AM   [facility] [address] [place], [place], [address] Phone: [phone]   Fax:  [phone]  Name: Renee Murray MRN: [id number] Date of Birth: 27-Feb-1956

## 2017-02-09 ENCOUNTER — Ambulatory Visit (HOSPITAL_COMMUNITY)
Admission: EM | Admit: 2017-02-09 | Discharge: 2017-02-09 | Disposition: A | Discharge status: Home / Self Care | Payer: BLUE CROSS/BLUE SHIELD | Attending: Family Medicine | Admitting: Family Medicine

## 2017-02-09 ENCOUNTER — Encounter (HOSPITAL_COMMUNITY): Payer: Self-pay

## 2017-02-09 DIAGNOSIS — S46912A Strain of unspecified muscle, fascia and tendon at shoulder and upper arm level, left arm, initial encounter: Secondary | ICD-10-CM | POA: Diagnosis not present

## 2017-02-09 DIAGNOSIS — M25512 Pain in left shoulder: Secondary | ICD-10-CM | POA: Diagnosis not present
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
## 2017-02-09 NOTE — Discharge Instructions (Signed)
Rest the arm is discussed. Apply heat to the area pain, be sure to keep the shoulder down and not up towards the neck. Limit the amount of work performed at the shoulder for the next several days. He may take 200 mg of ibuprofen at 6:30 this evening and then tomorrow take higher doses of ibuprofen and stop the meloxicam. Follow up with your doctor as needed.
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
## 2017-02-11 ENCOUNTER — Encounter: Payer: Self-pay | Admitting: Student

## 2017-02-11 ENCOUNTER — Encounter: Payer: Self-pay | Admitting: Family Medicine

## 2017-02-12 ENCOUNTER — Encounter: Payer: Self-pay | Admitting: Physical Therapy

## 2017-02-12 ENCOUNTER — Other Ambulatory Visit: Payer: Self-pay | Admitting: *Deleted

## 2017-02-13 ENCOUNTER — Ambulatory Visit: Payer: BLUE CROSS/BLUE SHIELD | Admitting: Physical Therapy

## 2017-02-13 ENCOUNTER — Encounter: Payer: Self-pay | Admitting: Physical Therapy

## 2017-02-13 DIAGNOSIS — M542 Cervicalgia: Secondary | ICD-10-CM

## 2017-02-13 DIAGNOSIS — R262 Difficulty in walking, not elsewhere classified: Secondary | ICD-10-CM

## 2017-02-13 DIAGNOSIS — M79604 Pain in right leg: Secondary | ICD-10-CM

## 2017-02-13 DIAGNOSIS — M6281 Muscle weakness (generalized): Secondary | ICD-10-CM
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
## 2017-02-15 ENCOUNTER — Ambulatory Visit: Payer: BLUE CROSS/BLUE SHIELD | Admitting: Physical Therapy

## 2017-02-15 ENCOUNTER — Encounter: Payer: Self-pay | Admitting: Physical Therapy

## 2017-02-20 ENCOUNTER — Ambulatory Visit: Payer: BLUE CROSS/BLUE SHIELD | Admitting: Physical Therapy

## 2017-02-23 ENCOUNTER — Ambulatory Visit: Payer: BLUE CROSS/BLUE SHIELD | Admitting: Physical Therapy

## 2017-03-22 MED FILL — AMLODIPINE BESYLATE 5 MG TA: 5 | 30 days supply | Qty: 30 | Fill #3

## 2017-03-27 ENCOUNTER — Ambulatory Visit (INDEPENDENT_AMBULATORY_CARE_PROVIDER_SITE_OTHER): Payer: BLUE CROSS/BLUE SHIELD | Admitting: Family Medicine

## 2017-03-27 ENCOUNTER — Encounter: Payer: Self-pay | Admitting: Family Medicine

## 2017-03-27 VITALS — BP 135/65 | HR 67 | Temp 98.2°F | Resp 16 | Ht 59.0 in | Wt 159.0 lb

## 2017-03-27 DIAGNOSIS — F418 Other specified anxiety disorders: Secondary | ICD-10-CM | POA: Diagnosis not present

## 2017-03-27 DIAGNOSIS — G5690 Unspecified mononeuropathy of unspecified upper limb: Secondary | ICD-10-CM | POA: Diagnosis not present

## 2017-03-27 DIAGNOSIS — I10 Essential (primary) hypertension: Secondary | ICD-10-CM | POA: Diagnosis not present

## 2017-03-27 DIAGNOSIS — R7303 Prediabetes: Secondary | ICD-10-CM | POA: Diagnosis not present

## 2017-03-27 DIAGNOSIS — R8299 Other abnormal findings in urine: Secondary | ICD-10-CM | POA: Diagnosis not present

## 2017-03-27 DIAGNOSIS — R82998 Other abnormal findings in urine: Secondary | ICD-10-CM

## 2017-03-27 LAB — BASIC METABOLIC PANEL WITH GFR
BUN: 8 mg/dL (ref 7–25)
CHLORIDE: 106 mmol/L (ref 98–110)
CO2: 26 mmol/L (ref 20–31)
CREATININE: 0.73 mg/dL (ref 0.50–0.99)
Calcium: 8.6 mg/dL (ref 8.6–10.4)
GFR, Est Non African American: 88 mL/min (ref >=60)
Glucose, Bld: 92 mg/dL (ref 65–99)
POTASSIUM: 4 mmol/L (ref 3.5–5.3)
SODIUM: 141 mmol/L (ref 135–146)

## 2017-03-27 LAB — POCT URINALYSIS DIP (DEVICE)
BILIRUBIN URINE: NEGATIVE
Glucose, UA: NEGATIVE mg/dL
Ketones, ur: NEGATIVE mg/dL
Nitrite: NEGATIVE
PH: 6.5 (ref 5.0–8.0)
Protein, ur: NEGATIVE mg/dL
SPECIFIC GRAVITY, URINE: 1.01 (ref 1.005–1.030)
Urobilinogen, UA: 0.2 mg/dL (ref 0.0–1.0)

## 2017-03-27 LAB — LIPID PANEL
CHOL/HDL RATIO: 4.3 ratio (ref <5.0)
CHOLESTEROL: 164 mg/dL (ref <200)
HDL: 38 mg/dL — ABNORMAL LOW (ref >50)
LDL Cholesterol: 100 mg/dL — ABNORMAL HIGH (ref <100)
TRIGLYCERIDES: 130 mg/dL (ref <150)
VLDL: 26 mg/dL (ref <30)

## 2017-03-27 LAB — POCT GLYCOSYLATED HEMOGLOBIN (HGB A1C): Hemoglobin A1C: 5.8

## 2017-03-27 MED ORDER — BUSPIRONE HCL 10 MG PO TABS: 10.0000 mg | ORAL_TABLET | Freq: Three times a day (TID) | ORAL | 5 refills | Status: DC

## 2017-03-27 MED FILL — busPIRone HCL 10 MG TABS: 10 | 20 days supply | Qty: 60 | Fill #0

## 2017-03-27 NOTE — Patient Instructions (Addendum)
Anxiety: Will start a trial of Buspar 10 mg twice daily. Please refer to handout for potential side effects. Also, sent a referral to psychology for anxiety.  Hypertension: Will continue Amlodpine 5 mg daily.  Neuropathy: Sent a referral to physical therapy for further treatment and evaluation.    Buspirone tablets What is this medicine? BUSPIRONE (byoo SPYE rone) is used to treat anxiety disorders. This medicine may be used for other purposes; ask your health care provider or pharmacist if you have questions. COMMON BRAND NAME(S): BuSpar What should I tell my health care provider before I take this medicine? They need to know if you have any of these conditions: -kidney or liver disease -an unusual or allergic reaction to buspirone, other medicines, foods, dyes, or preservatives -pregnant or trying to get pregnant -breast-feeding How should I use this medicine? Take this medicine by mouth with a glass of water. Follow the directions on the prescription label. You may take this medicine with or without food. To ensure that this medicine always works the same way for you, you should take it either always with or always without food. Take your doses at regular intervals. Do not take your medicine more often than directed. Do not stop taking except on the advice of your doctor or health care professional. Talk to your pediatrician regarding the use of this medicine in children. Special care may be needed. Overdosage: If you think you have taken too much of this medicine contact a poison control center or emergency room at once. NOTE: This medicine is only for you. Do not share this medicine with others. What if I miss a dose? If you miss a dose, take it as soon as you can. If it is almost time for your next dose, take only that dose. Do not take double or extra doses. What may interact with this medicine? Do not take this medicine with any of the following medications: -linezolid -MAOIs like  Carbex, Eldepryl, Marplan, Nardil, and Parnate -methylene blue -procarbazine This medicine may also interact with the following medications: -diazepam -digoxin -diltiazem -erythromycin -grapefruit juice -haloperidol -medicines for mental depression or mood problems -medicines for seizures like carbamazepine, phenobarbital and phenytoin -nefazodone -other medications for anxiety -rifampin -ritonavir -some antifungal medicines like itraconazole, ketoconazole, and voriconazole -verapamil -warfarin This list may not describe all possible interactions. Give your health care provider a list of all the medicines, herbs, non-prescription drugs, or dietary supplements you use. Also tell them if you smoke, drink alcohol, or use illegal drugs. Some items may interact with your medicine. What should I watch for while using this medicine? Visit your doctor or health care professional for regular checks on your progress. It may take 1 to 2 weeks before your anxiety gets better. You may get drowsy or dizzy. Do not drive, use machinery, or do anything that needs mental alertness until you know how this drug affects you. Do not stand or sit up quickly, especially if you are an older patient. This reduces the risk of dizzy or fainting spells. Alcohol can make you more drowsy and dizzy. Avoid alcoholic drinks. What side effects may I notice from receiving this medicine? Side effects that you should report to your doctor or health care professional as soon as possible: -blurred vision or other vision changes -chest pain -confusion -difficulty breathing -feelings of hostility or anger -muscle aches and pains -numbness or tingling in hands or feet -ringing in the ears -skin rash and itching -vomiting -weakness Side effects that usually  do not require medical attention (report to your doctor or health care professional if they continue or are bothersome): -disturbed dreams,  nightmares -headache -nausea -restlessness or nervousness -sore throat and nasal congestion -stomach upset This list may not describe all possible side effects. Call your doctor for medical advice about side effects. You may report side effects to FDA at 1-800-FDA-1088. Where should I keep my medicine? Keep out of the reach of children. Store at room temperature below 30 degrees C (86 degrees F). Protect from light. Keep container tightly closed. Throw away any unused medicine after the expiration date. NOTE: This sheet is a summary. It may not cover all possible information. If you have questions about this medicine, talk to your doctor, pharmacist, or health care provider.  2018 Elsevier/Gold Standard (2010-07-14 18:06:11)  DASH Eating Plan DASH stands for "Dietary Approaches to Stop Hypertension." The DASH eating plan is a healthy eating plan that has been shown to reduce high blood pressure (hypertension). It may also reduce your risk for type 2 diabetes, heart disease, and stroke. The DASH eating plan may also help with weight loss. What are tips for following this plan? General guidelines   Avoid eating more than 2,300 mg (milligrams) of salt (sodium) a day. If you have hypertension, you may need to reduce your sodium intake to 1,500 mg a day.  Limit alcohol intake to no more than 1 drink a day for nonpregnant women and 2 drinks a day for men. One drink equals 12 oz of beer, 5 oz of wine, or 1 oz of hard liquor.  Work with your health care provider to maintain a healthy body weight or to lose weight. Ask what an ideal weight is for you.  Get at least 30 minutes of exercise that causes your heart to beat faster (aerobic exercise) most days of the week. Activities may include walking, swimming, or biking.  Work with your health care provider or diet and nutrition specialist (dietitian) to adjust your eating plan to your individual calorie needs. Reading food labels   Check food  labels for the amount of sodium per serving. Choose foods with less than 5 percent of the Daily Value of sodium. Generally, foods with less than 300 mg of sodium per serving fit into this eating plan.  To find whole grains, look for the word "whole" as the first word in the ingredient list. Shopping   Buy products labeled as "low-sodium" or "no salt added."  Buy fresh foods. Avoid canned foods and premade or frozen meals. Cooking   Avoid adding salt when cooking. Use salt-free seasonings or herbs instead of table salt or sea salt. Check with your health care provider or pharmacist before using salt substitutes.  Do not fry foods. Cook foods using healthy methods such as baking, boiling, grilling, and broiling instead.  Cook with heart-healthy oils, such as olive, canola, soybean, or sunflower oil. Meal planning    Eat a balanced diet that includes:  5 or more servings of fruits and vegetables each day. At each meal, try to fill half of your plate with fruits and vegetables.  Up to 6-8 servings of whole grains each day.  Less than 6 oz of lean meat, poultry, or fish each day. A 3-oz serving of meat is about the same size as a deck of cards. One egg equals 1 oz.  2 servings of low-fat dairy each day.  A serving of nuts, seeds, or beans 5 times each week.  Heart-healthy fats.  Healthy fats called Omega-3 fatty acids are found in foods such as flaxseeds and coldwater fish, like sardines, salmon, and mackerel.  Limit how much you eat of the following:  Canned or prepackaged foods.  Food that is high in trans fat, such as fried foods.  Food that is high in saturated fat, such as fatty meat.  Sweets, desserts, sugary drinks, and other foods with added sugar.  Full-fat dairy products.  Do not salt foods before eating.  Try to eat at least 2 vegetarian meals each week.  Eat more home-cooked food and less restaurant, buffet, and fast food.  When eating at a restaurant, ask that  your food be prepared with less salt or no salt, if possible. What foods are recommended? The items listed may not be a complete list. Talk with your dietitian about what dietary choices are best for you. Grains  Whole-grain or whole-wheat bread. Whole-grain or whole-wheat pasta. Brown rice. Orpah Cobb. Bulgur. Whole-grain and low-sodium cereals. Pita bread. Low-fat, low-sodium crackers. Whole-wheat flour tortillas. Vegetables  Fresh or frozen vegetables (raw, steamed, roasted, or grilled). Low-sodium or reduced-sodium tomato and vegetable juice. Low-sodium or reduced-sodium tomato sauce and tomato paste. Low-sodium or reduced-sodium canned vegetables. Fruits  All fresh, dried, or frozen fruit. Canned fruit in natural juice (without added sugar). Meat and other protein foods  Skinless chicken or [place]. Ground chicken or [place]. Pork with fat trimmed off. Fish and seafood. Egg whites. Dried beans, peas, or lentils. Unsalted nuts, nut butters, and seeds. Unsalted canned beans. Lean cuts of beef with fat trimmed off. Low-sodium, lean deli meat. Dairy  Low-fat (1%) or fat-free (skim) milk. Fat-free, low-fat, or reduced-fat cheeses. Nonfat, low-sodium ricotta or cottage cheese. Low-fat or nonfat yogurt. Low-fat, low-sodium cheese. Fats and oils  Soft margarine without trans fats. Vegetable oil. Low-fat, reduced-fat, or light mayonnaise and salad dressings (reduced-sodium). Canola, safflower, olive, soybean, and sunflower oils. Avocado. Seasoning and other foods  Herbs. Spices. Seasoning mixes without salt. Unsalted popcorn and pretzels. Fat-free sweets. What foods are not recommended? The items listed may not be a complete list. Talk with your dietitian about what dietary choices are best for you. Grains  Baked goods made with fat, such as croissants, muffins, or some breads. Dry pasta or rice meal packs. Vegetables  Creamed or fried vegetables. Vegetables in a cheese sauce. Regular canned  vegetables (not low-sodium or reduced-sodium). Regular canned tomato sauce and paste (not low-sodium or reduced-sodium). Regular tomato and vegetable juice (not low-sodium or reduced-sodium). Rosita Fire. Olives. Fruits  Canned fruit in a light or heavy syrup. Fried fruit. Fruit in cream or butter sauce. Meat and other protein foods  Fatty cuts of meat. Ribs. Fried meat. Tomasa Blase. Sausage. Bologna and other processed lunch meats. Salami. Fatback. Hotdogs. Bratwurst. Salted nuts and seeds. Canned beans with added salt. Canned or smoked fish. Whole eggs or egg yolks. Chicken or [place] with skin. Dairy  Whole or 2% milk, cream, and half-and-half. Whole or full-fat cream cheese. Whole-fat or sweetened yogurt. Full-fat cheese. Nondairy creamers. Whipped toppings. Processed cheese and cheese spreads. Fats and oils  Butter. Stick margarine. Lard. Shortening. Ghee. Bacon fat. Tropical oils, such as coconut, palm kernel, or palm oil. Seasoning and other foods  Salted popcorn and pretzels. Onion salt, garlic salt, seasoned salt, table salt, and sea salt. Worcestershire sauce. Tartar sauce. Barbecue sauce. Teriyaki sauce. Soy sauce, including reduced-sodium. Steak sauce. Canned and packaged gravies. Fish sauce. Oyster sauce. Cocktail sauce. Horseradish that you find on the shelf. Ketchup.  Mustard. Meat flavorings and tenderizers. Bouillon cubes. Hot sauce and Tabasco sauce. Premade or packaged marinades. Premade or packaged taco seasonings. Relishes. Regular salad dressings. Where to find more information:  National Heart, Lung, and Blood Institute: PopSteam.is  American Heart Association: www.heart.org Summary  The DASH eating plan is a healthy eating plan that has been shown to reduce high blood pressure (hypertension). It may also reduce your risk for type 2 diabetes, heart disease, and stroke.  With the DASH eating plan, you should limit salt (sodium) intake to 2,300 mg a day. If you have hypertension,  you may need to reduce your sodium intake to 1,500 mg a day.  When on the DASH eating plan, aim to eat more fresh fruits and vegetables, whole grains, lean proteins, low-fat dairy, and heart-healthy fats.  Work with your health care provider or diet and nutrition specialist (dietitian) to adjust your eating plan to your individual calorie needs. This information is not intended to replace advice given to you by your health care provider. Make sure you discuss any questions you have with your health care provider. Document Released: 11/23/2011 Document Revised: 11/27/2016 Document Reviewed: 11/27/2016 Elsevier Interactive Patient Education  2017 Elsevier Inc.  Peripheral Neuropathy Peripheral neuropathy is a type of nerve damage. It affects nerves that carry signals between the spinal cord and other parts of the body. These are called peripheral nerves. With peripheral neuropathy, one nerve or a group of nerves may be damaged. What are the causes? Many things can damage peripheral nerves. For some people with peripheral neuropathy, the cause is unknown. Some causes include:  Diabetes. This is the most common cause of peripheral neuropathy.  Injury to a nerve.  Pressure or stress on a nerve that lasts a long time.  Too little vitamin B. Alcoholism can lead to this.  Infections.  Autoimmune diseases, such as multiple sclerosis and systemic lupus erythematosus.  Inherited nerve diseases.  Some medicines, such as cancer drugs.  Toxic substances, such as lead and mercury.  Too little blood flowing to the legs.  Kidney disease.  Thyroid disease. What are the signs or symptoms? Different people have different symptoms. The symptoms you have will depend on which of your nerves is damaged. Common symptoms include:  Loss of feeling (numbness) in the feet and hands.  Tingling in the feet and hands.  Pain that burns.  Very sensitive skin.  Weakness.  Not being able to move a  part of the body (paralysis).  Muscle twitching.  Clumsiness or poor coordination.  Loss of balance.  Not being able to control your bladder.  Feeling dizzy.  Sexual problems. How is this diagnosed? Peripheral neuropathy is a symptom, not a disease. Finding the cause of peripheral neuropathy can be hard. To figure that out, your health care provider will take a medical history and do a physical exam. A neurological exam will also be done. This involves checking things affected by your brain, spinal cord, and nerves (nervous system). For example, your health care provider will check your reflexes, how you move, and what you can feel. Other types of tests may also be ordered, such as:  Blood tests.  A test of the fluid in your spinal cord.  Imaging tests, such as CT scans or an MRI.  Electromyography (EMG). This test checks the nerves that control muscles.  Nerve conduction velocity tests. These tests check how fast messages pass through your nerves.  Nerve biopsy. A small piece of nerve is removed. It is  then checked under a microscope. How is this treated?  Medicine is often used to treat peripheral neuropathy. Medicines may include:  Pain-relieving medicines. Prescription or over-the-counter medicine may be suggested.  Antiseizure medicine. This may be used for pain.  Antidepressants. These also may help ease pain from neuropathy.  Lidocaine. This is a numbing medicine. You might wear a patch or be given a shot.  Mexiletine. This medicine is typically used to help control irregular heart rhythms.  Surgery. Surgery may be needed to relieve pressure on a nerve or to destroy a nerve that is causing pain.  Physical therapy to help movement.  Assistive devices to help movement. Follow these instructions at home:  Only take over-the-counter or prescription medicines as directed by your health care provider. Follow the instructions carefully for any given medicines. Do not  take any other medicines without first getting approval from your health care provider.  If you have diabetes, work closely with your health care provider to keep your blood sugar under control.  If you have numbness in your feet:  Check every day for signs of injury or infection. Watch for redness, warmth, and swelling.  Wear padded socks and comfortable shoes. These help protect your feet.  Do not do things that put pressure on your damaged nerve.  Do not smoke. Smoking keeps blood from getting to damaged nerves.  Avoid or limit alcohol. Too much alcohol can cause a lack of B vitamins. These vitamins are needed for healthy nerves.  Develop a good support system. Coping with peripheral neuropathy can be stressful. Talk to a mental health specialist or join a support group if you are struggling.  Follow up with your health care provider as directed. Contact a health care provider if:  You have new signs or symptoms of peripheral neuropathy.  You are struggling emotionally from dealing with peripheral neuropathy.  You have a fever. Get help right away if:  You have an injury or infection that is not healing.  You feel very dizzy or begin vomiting.  You have chest pain.  You have trouble breathing. This information is not intended to replace advice given to you by your health care provider. Make sure you discuss any questions you have with your health care provider. Document Released: 11/24/2002 Document Revised: 05/11/2016 Document Reviewed: 08/11/2013 Elsevier Interactive Patient Education  2017 [employer].

## 2017-03-27 NOTE — Progress Notes (Signed)
Subjective:    Patient ID: Renee Murray, female    DOB: 1956-03-10, 61 y.o.   MRN: [id number]  HPI [name], a 61 year old female with a history of a brain aneurysm, hypertension, and prediabetes presents for a follow up of hypertension. [name] says that Renee Murray has been taking antihypertensive medication consistently. Renee Murray is not exercising, but does follow a low sodium diet. Patient denies chest pain, dyspnea, fatigue, orthopnea, palpitations, syncope and tachypnea.  Cardiovascular risk factors include: obesity (BMI >= 30 kg/m2) and sedentary lifestyle.   Renee Murray also has a history of neuropathy, primarily to her upper extremities. Renee Murray says that symptoms of neuropathy include shooting pain, numbness, and tingling. Symptoms are moderately controlled on Gabapentin 300 mg TID. Renee Murray says that pain is worsened at night.  Past Medical History:  Diagnosis Date  . Brain aneurysm   . Hypertension    Social History   Social History  . Marital status: Divorced    Spouse name: N/A  . Number of children: N/A  . Years of education: N/A   Occupational History  . Not on file.   Social History Main Topics  . Smoking status: Current Every Day Smoker    Packs/day: 0.25    Years: 20.00    Types: Cigarettes  . Smokeless tobacco: Never Used  . Alcohol use No  . Drug use: No  . Sexual activity: Not on file   Other Topics Concern  . Not on file   Social History Narrative  . No narrative on file   Immunization History  Administered Date(s) Administered  . Influenza,inj,Quad PF,36+ Mos 09/13/2015  . Tdap 07/26/2016   Allergies  Allergen Reactions  . Demerol Other (See Comments)    "almost died"  . Morphine And Related Other (See Comments)    "Almost Died"   Review of Systems  Constitutional: Negative for fatigue, fever and unexpected weight change.  HENT: Negative for tinnitus.   Eyes: Negative for visual disturbance.  Respiratory: Negative for chest tightness.    Gastrointestinal: Negative for constipation, diarrhea and nausea.  Endocrine: Negative for cold intolerance, heat intolerance, polydipsia, polyphagia and polyuria.  Genitourinary: Negative.   Musculoskeletal: Positive for back pain and myalgias.  Skin: Negative.   Neurological: Positive for numbness (numbness and tingling to right upper extremity). Negative for dizziness, tremors, facial asymmetry and weakness.  Psychiatric/Behavioral: Negative for agitation and suicidal ideas. The patient is not nervous/anxious.        Objective:   Physical Exam  Constitutional: Renee Murray appears well-developed and well-nourished.  HENT:  Head: Normocephalic and atraumatic.  Right Ear: External ear normal.  Left Ear: External ear normal.  Mouth/Throat: Oropharynx is clear and moist.  Eyes: Conjunctivae and EOM are normal. Pupils are equal, round, and reactive to light.  Neck: Trachea normal and normal range of motion.  Cardiovascular: Normal rate, regular rhythm, normal heart sounds and normal pulses.   Pulmonary/Chest: Effort normal and breath sounds normal.  Abdominal: Soft. Normal appearance and bowel sounds are normal.  Musculoskeletal:       Left shoulder: Renee Murray exhibits normal range of motion and no tenderness.       Right wrist: Renee Murray exhibits decreased range of motion and tenderness.  Lymphadenopathy:       Head (right side): No submental and no submandibular adenopathy present.       Head (left side): No submental and no submandibular adenopathy present.  Neurological: Renee Murray has normal strength. No cranial nerve deficit  or sensory deficit.  Psychiatric: Renee Murray has a normal mood and affect. Her speech is normal and behavior is normal. Judgment and thought content normal. Cognition and memory are normal. Renee Murray expresses no homicidal and no suicidal ideation.      BP 135/65 (BP Location: Left Arm, Patient Position: Sitting, Cuff Size: Large)   Pulse 67   Temp 98.2 F (36.8 C) (Oral)   Resp 16   Ht 4'  11" (1.499 m)   Wt 159 lb (72.1 kg)   SpO2 100%   BMI 32.11 kg/m  Assessment & Plan:  1. Anxiety about health - busPIRone (BUSPAR) 10 MG tablet; Take 1 tablet (10 mg total) by mouth 3 (three) times daily.  Dispense: 60 tablet; Refill: 5 - Ambulatory referral to Psychology  2. Essential hypertension The patient is asked to make an attempt to improve diet and exercise patterns to aid in medical management of this problem. - BASIC METABOLIC PANEL WITH GFR - Lipid Panel  3. Prediabetes Recommend a lowfat, low carbohydrate diet divided over 5-6 small meals, increase water intake to 6-8 glasses, and 150 minutes per week of cardiovascular exercise.   - HgB A1c - BASIC METABOLIC PANEL WITH GFR  4. Neuropathy of upper extremity, unspecified laterality Patient to continue Gabapentin 300 mg TID as previously prescribed - PT evaluation; Future  5. Leukocytes in urine - Urine culture   RTC; 1 month for anxiety   Nolon Nations  MSN, FNP-C [facility] [address] [place], [place] [address] [phone]

## 2017-03-28 LAB — URINE CULTURE: Organism ID, Bacteria: NO GROWTH

## 2017-04-02 ENCOUNTER — Telehealth: Payer: Self-pay

## 2017-04-04 ENCOUNTER — Other Ambulatory Visit: Payer: Self-pay | Admitting: Family Medicine

## 2017-04-04 DIAGNOSIS — F418 Other specified anxiety disorders: Secondary | ICD-10-CM

## 2017-04-04 MED ORDER — BUSPIRONE HCL 5 MG PO TABS: 5.0000 mg | ORAL_TABLET | Freq: Every day | ORAL | 0 refills | Status: DC

## 2017-04-04 NOTE — Progress Notes (Signed)
Meds ordered this encounter  Medications  . busPIRone (BUSPAR) 5 MG tablet    Sig: Take 1 tablet (5 mg total) by mouth daily.    Dispense:  30 tablet    Refill:  0  Will reduce dose of Buspar due to patient reported side effects. Patient to follow up in clinic as scheduled.    Nolon Nations  MSN, FNP-C [facility] [address] [place], [place] [address] [phone]

## 2017-04-20 MED FILL — AMLODIPINE BESYLATE 5 MG TA: 5 | 30 days supply | Qty: 30 | Fill #4

## 2017-04-27 MED FILL — FLUTICASONE PROP 50 MCG SPR: 50 | 30 days supply | Qty: 16 | Fill #2

## 2017-06-18 ENCOUNTER — Other Ambulatory Visit: Payer: Self-pay | Admitting: Internal Medicine

## 2017-06-18 DIAGNOSIS — I10 Essential (primary) hypertension: Secondary | ICD-10-CM

## 2017-06-18 MED FILL — AMLODIPINE BESYLATE 5 MG TA: 5 | 30 days supply | Qty: 30 | Fill #5

## 2017-06-25 ENCOUNTER — Ambulatory Visit: Payer: Self-pay | Admitting: Family Medicine

## 2017-06-26 ENCOUNTER — Ambulatory Visit: Payer: Self-pay | Admitting: Family Medicine

## 2017-07-16 ENCOUNTER — Other Ambulatory Visit: Payer: Self-pay | Admitting: Internal Medicine

## 2017-07-16 DIAGNOSIS — I10 Essential (primary) hypertension: Secondary | ICD-10-CM

## 2017-07-16 MED FILL — AMLODIPINE BESYLATE 5 MG TA: 5 | 30 days supply | Qty: 30 | Fill #0

## 2017-07-30 ENCOUNTER — Other Ambulatory Visit: Payer: Self-pay | Admitting: Family Medicine

## 2017-07-30 MED FILL — GABAPENTIN 300 MG CAPSULE: 300 | 30 days supply | Qty: 90 | Fill #0

## 2017-08-30 ENCOUNTER — Ambulatory Visit: Payer: Self-pay | Admitting: Family Medicine

## 2017-12-19 MED FILL — GABAPENTIN 300 MG CAPSULE: 300 | 30 days supply | Qty: 90 | Fill #1

## 2017-12-19 MED FILL — AMLODIPINE BESYLATE 5 MG TA: 5 | 30 days supply | Qty: 30 | Fill #1

## 2018-01-04 ENCOUNTER — Ambulatory Visit (INDEPENDENT_AMBULATORY_CARE_PROVIDER_SITE_OTHER): Payer: Medicare Other | Admitting: Family Medicine

## 2018-01-04 ENCOUNTER — Encounter: Payer: Self-pay | Admitting: Family Medicine

## 2018-01-04 VITALS — BP 132/66 | HR 75 | Temp 98.2°F | Resp 16 | Ht 59.0 in | Wt 159.0 lb

## 2018-01-04 DIAGNOSIS — Z720 Tobacco use: Secondary | ICD-10-CM | POA: Diagnosis not present

## 2018-01-04 DIAGNOSIS — R7303 Prediabetes: Secondary | ICD-10-CM | POA: Diagnosis not present

## 2018-01-04 DIAGNOSIS — N6089 Other benign mammary dysplasias of unspecified breast: Secondary | ICD-10-CM | POA: Diagnosis not present

## 2018-01-04 DIAGNOSIS — F418 Other specified anxiety disorders: Secondary | ICD-10-CM | POA: Diagnosis not present

## 2018-01-04 DIAGNOSIS — J309 Allergic rhinitis, unspecified: Secondary | ICD-10-CM | POA: Diagnosis not present

## 2018-01-04 DIAGNOSIS — I10 Essential (primary) hypertension: Secondary | ICD-10-CM | POA: Diagnosis not present

## 2018-01-04 DIAGNOSIS — R928 Other abnormal and inconclusive findings on diagnostic imaging of breast: Secondary | ICD-10-CM | POA: Diagnosis not present

## 2018-01-04 LAB — POCT GLYCOSYLATED HEMOGLOBIN (HGB A1C): Hemoglobin A1C: 5.8

## 2018-01-04 MED ORDER — CETIRIZINE HCL 10 MG PO TABS: 10.0000 mg | ORAL_TABLET | Freq: Every day | ORAL | 11 refills | Status: DC

## 2018-01-04 MED ORDER — BUSPIRONE HCL 5 MG PO TABS: 5.0000 mg | ORAL_TABLET | Freq: Two times a day (BID) | ORAL | 2 refills | Status: DC

## 2018-01-04 MED FILL — busPIRone HCL 5 MG TABS: 5 | 15 days supply | Qty: 30 | Fill #0

## 2018-01-04 NOTE — Patient Instructions (Signed)
Your blood pressure is at goal, no medication changes warranted on today.   Continue medication, monitor blood pressure at home. Continue DASH diet. Reminder to go to the ER if any CP, SOB, nausea, dizziness, severe HA, changes vision/speech, left arm numbness and tingling and jaw pain.

## 2018-01-04 NOTE — Progress Notes (Signed)
Subjective:    Patient ID: Renee Murray, female    DOB: 01/08/1956, 62 y.o.   MRN: [id number]  HPI [name] Murray, a 62 year old female with a history of a brain aneurysm, hypertension, and prediabetes presents for a follow up of hypertension. [name] says that she has been taking antihypertensive medication consistently. She is not exercising, but does follow a low sodium diet. Patient denies chest pain, dyspnea, fatigue, orthopnea, palpitations, syncope and tachypnea.  Cardiovascular risk factors include: obesity (BMI >= 30 kg/m2) and sedentary lifestyle.   [name] also has a history of neuropathy, primarily to her upper extremities. She says that symptoms of neuropathy include shooting pain, numbness, and tingling. Symptoms are moderately controlled on Gabapentin 300 mg TID.  Past Medical History:  Diagnosis Date  . Brain aneurysm   . Hypertension    Social History   Socioeconomic History  . Marital status: Divorced    Spouse name: Not on file  . Number of children: Not on file  . Years of education: Not on file  . Highest education level: Not on file  Social Needs  . Financial resource strain: Not on file  . Food insecurity - worry: Not on file  . Food insecurity - inability: Not on file  . Transportation needs - medical: Not on file  . Transportation needs - non-medical: Not on file  Occupational History  . Not on file  Tobacco Use  . Smoking status: Current Every Day Smoker    Packs/day: 0.25    Years: 20.00    Pack years: 5.00    Types: Cigarettes  . Smokeless tobacco: Never Used  Substance and Sexual Activity  . Alcohol use: No  . Drug use: No  . Sexual activity: Not on file  Other Topics Concern  . Not on file  Social History Narrative  . Not on file   Immunization History  Administered Date(s) Administered  . Influenza,inj,Quad PF,6+ Mos 09/13/2015  . Tdap 07/26/2016   Allergies  Allergen Reactions  . Demerol Other (See Comments)   "almost died"  . Morphine And Related Other (See Comments)    "Almost Died"   Review of Systems  Constitutional: Negative for fatigue, fever and unexpected weight change.  HENT: Negative for tinnitus.   Eyes: Negative for visual disturbance.  Respiratory: Negative.  Negative for chest tightness.   Gastrointestinal: Negative for constipation, diarrhea and nausea.  Endocrine: Negative for cold intolerance, heat intolerance, polydipsia, polyphagia and polyuria.  Genitourinary: Negative.   Skin: Negative.   Neurological: Positive for numbness (numbness and tingling to right upper extremity). Negative for dizziness, tremors, facial asymmetry and weakness.  Psychiatric/Behavioral: Negative for agitation and suicidal ideas. The patient is not nervous/anxious.        Objective:   Physical Exam  Constitutional: She appears well-developed and well-nourished.  HENT:  Head: Normocephalic and atraumatic.  Right Ear: External ear normal.  Left Ear: External ear normal.  Mouth/Throat: Oropharynx is clear and moist.  Eyes: Conjunctivae and EOM are normal. Pupils are equal, round, and reactive to light.  Neck: Trachea normal and normal range of motion.  Cardiovascular: Normal rate, regular rhythm, normal heart sounds and normal pulses.  Pulmonary/Chest: Effort normal and breath sounds normal.  Abdominal: Soft. Normal appearance and bowel sounds are normal.  Musculoskeletal:       Left shoulder: She exhibits normal range of motion and no tenderness.       Right wrist: She exhibits decreased range  of motion and tenderness.  Lymphadenopathy:       Head (right side): No submental and no submandibular adenopathy present.       Head (left side): No submental and no submandibular adenopathy present.  Neurological: She has normal strength. No cranial nerve deficit or sensory deficit.  Psychiatric: She has a normal mood and affect. Her speech is normal and behavior is normal. Judgment and thought content  normal. Cognition and memory are normal. She expresses no homicidal and no suicidal ideation.      BP 132/66 (BP Location: Left Arm, Patient Position: Sitting, Cuff Size: Normal) Comment: manually  Pulse 75   Temp 98.2 F (36.8 C) (Oral)   Resp 16   Ht 4\' 11"  (1.499 m)   Wt 159 lb (72.1 kg)   SpO2 100%   BMI 32.11 kg/m  Assessment & Plan:   Essential hypertension Blood pressure is at goal on current medication regimen, no changes warranted.  - Basic Metabolic Panel   Tobacco abuse Smoking cessation instruction/counseling given:  counseled patient on the dangers of tobacco use, advised patient to stop smoking, and reviewed strategies to maximize success  Prediabetes Previous hemoglobin a1C is 5.8, which is consistent with prediabetes. Recommend a lowfat, low carbohydrate diet divided over 5-6 small meals, increase water intake to 6-8 glasses, and 150 minutes per week of cardiovascular exercise.   - Basic Metabolic Panel - HgB A1c  Allergic rhinitis, unspecified seasonality, unspecified trigger - cetirizine (ZYRTEC) 10 MG tablet; Take 1 tablet (10 mg total) by mouth daily.  Dispense: 30 tablet; Refill: 11   Anxiety about health Will continue Buspar as previously prescribed.    - busPIRone (BUSPAR) 5 MG tablet; Take 1 tablet (5 mg total) by mouth 2 (two) times daily.  Dispense: 30 tablet; Refill: 2   Abnormal mammogram Patient has a history of abnormal mammogram, a follow up breast ultrasound recommended in 6 months, patient is past due.   Apocrine metaplasia of breast, unspecified laterality  - US BREAST LTD UNI LEFT INC AXILLA; Future   RTC: 6 months for Medicare Wellness Visit   Nolon Nations  MSN, FNP-C Patient [facility] [address] [place], [place] [address] [phone]

## 2018-01-05 LAB — BASIC METABOLIC PANEL
BUN / CREAT RATIO: 19 (ref 12–28)
BUN: 12 mg/dL (ref 8–27)
CO2: 21 mmol/L (ref 20–29)
CREATININE: 0.63 mg/dL (ref 0.57–1.00)
Calcium: 8.7 mg/dL (ref 8.7–10.3)
Chloride: 106 mmol/L (ref 96–106)
GFR, EST AFRICAN AMERICAN: 110 mL/min/{1.73_m2} (ref >59)
GFR, EST NON AFRICAN AMERICAN: 96 mL/min/{1.73_m2} (ref >59)
Glucose: 89 mg/dL (ref 65–99)
Potassium: 4.3 mmol/L (ref 3.5–5.2)
SODIUM: 141 mmol/L (ref 134–144)

## 2018-01-21 ENCOUNTER — Encounter: Payer: Self-pay | Admitting: Family Medicine

## 2018-01-21 DIAGNOSIS — Z1211 Encounter for screening for malignant neoplasm of colon: Secondary | ICD-10-CM | POA: Diagnosis not present

## 2018-01-21 DIAGNOSIS — Z1212 Encounter for screening for malignant neoplasm of rectum: Secondary | ICD-10-CM | POA: Diagnosis not present

## 2018-01-21 LAB — COLOGUARD

## 2018-01-29 ENCOUNTER — Other Ambulatory Visit: Payer: Self-pay | Admitting: Family Medicine

## 2018-01-29 DIAGNOSIS — N63 Unspecified lump in unspecified breast: Secondary | ICD-10-CM

## 2018-01-30 ENCOUNTER — Telehealth: Payer: Self-pay | Admitting: Family Medicine

## 2018-01-30 DIAGNOSIS — R195 Other fecal abnormalities: Secondary | ICD-10-CM

## 2018-01-30 NOTE — Telephone Encounter (Signed)
Renee Murray, a 62 year old patient was sent a cologuard test for routine screening for colon cancer. Cologuard test was positive, diagnostic colonoscopy recommended for further evaluation. Will send a referral to gastroenterology. Left patient a message. Will continue to attempt to reach patient.    Renee [name]  MSN, FNP-C Patient Care [facility] [address]  [place], [place] [address] [phone]

## 2018-02-05 ENCOUNTER — Ambulatory Visit
Admission: RE | Admit: 2018-02-05 | Discharge: 2018-02-05 | Disposition: A | Discharge status: Home / Self Care | Payer: Medicare Other | Source: Ambulatory Visit | Attending: Family Medicine | Admitting: Family Medicine

## 2018-02-05 DIAGNOSIS — R922 Inconclusive mammogram: Secondary | ICD-10-CM | POA: Diagnosis not present

## 2018-02-05 DIAGNOSIS — N6323 Unspecified lump in the left breast, lower outer quadrant: Secondary | ICD-10-CM | POA: Diagnosis not present

## 2018-02-05 DIAGNOSIS — N63 Unspecified lump in unspecified breast: Secondary | ICD-10-CM

## 2018-02-05 DIAGNOSIS — R928 Other abnormal and inconclusive findings on diagnostic imaging of breast: Secondary | ICD-10-CM

## 2018-02-05 DIAGNOSIS — N6321 Unspecified lump in the left breast, upper outer quadrant: Secondary | ICD-10-CM | POA: Diagnosis not present

## 2018-02-08 DIAGNOSIS — Z0000 Encounter for general adult medical examination without abnormal findings: Secondary | ICD-10-CM | POA: Diagnosis not present

## 2018-02-08 DIAGNOSIS — I10 Essential (primary) hypertension: Secondary | ICD-10-CM | POA: Diagnosis not present

## 2018-02-08 DIAGNOSIS — Z131 Encounter for screening for diabetes mellitus: Secondary | ICD-10-CM | POA: Diagnosis not present

## 2018-02-08 DIAGNOSIS — Z789 Other specified health status: Secondary | ICD-10-CM | POA: Diagnosis not present

## 2018-02-08 DIAGNOSIS — Z13220 Encounter for screening for lipoid disorders: Secondary | ICD-10-CM | POA: Diagnosis not present

## 2018-02-08 DIAGNOSIS — Z1329 Encounter for screening for other suspected endocrine disorder: Secondary | ICD-10-CM | POA: Diagnosis not present

## 2018-02-08 DIAGNOSIS — B373 Candidiasis of vulva and vagina: Secondary | ICD-10-CM | POA: Diagnosis not present

## 2018-02-14 ENCOUNTER — Encounter: Payer: Self-pay | Admitting: Internal Medicine

## 2018-02-19 DIAGNOSIS — H40033 Anatomical narrow angle, bilateral: Secondary | ICD-10-CM | POA: Diagnosis not present

## 2018-02-19 DIAGNOSIS — H1013 Acute atopic conjunctivitis, bilateral: Secondary | ICD-10-CM | POA: Diagnosis not present

## 2018-03-04 DIAGNOSIS — F17200 Nicotine dependence, unspecified, uncomplicated: Secondary | ICD-10-CM | POA: Diagnosis not present

## 2018-03-04 DIAGNOSIS — I10 Essential (primary) hypertension: Secondary | ICD-10-CM | POA: Diagnosis not present

## 2018-03-04 DIAGNOSIS — M1990 Unspecified osteoarthritis, unspecified site: Secondary | ICD-10-CM | POA: Diagnosis not present

## 2018-03-04 DIAGNOSIS — E669 Obesity, unspecified: Secondary | ICD-10-CM | POA: Diagnosis not present

## 2018-04-08 ENCOUNTER — Other Ambulatory Visit: Payer: Self-pay | Admitting: Family Medicine

## 2018-04-08 DIAGNOSIS — F418 Other specified anxiety disorders: Secondary | ICD-10-CM

## 2018-04-08 MED ORDER — BUSPIRONE HCL 10 MG PO TABS: 5.0000 mg | ORAL_TABLET | Freq: Two times a day (BID) | ORAL | 5 refills | Status: DC

## 2018-04-08 NOTE — Progress Notes (Signed)
Meds ordered this encounter  Medications  . busPIRone (BUSPAR) 10 MG tablet    Sig: Take 0.5 tablets (5 mg total) by mouth 2 (two) times daily.    Dispense:  30 tablet    Refill:  5    Renee [name]  MSN, FNP-C Patient [facility] [address]  [place], [place] [address] [phone]

## 2018-04-16 ENCOUNTER — Telehealth: Payer: Self-pay

## 2018-04-16 NOTE — Telephone Encounter (Signed)
Patient No Showed for Pre-Visit. A message was left to call and reschedule before 5:00 Pm today. If patient does not reschedule a no show letter will be mailed at the end of today. The colonoscopy will be cancelled per [facility] guidelines.   Janalee Dane, LPN

## 2018-04-18 ENCOUNTER — Encounter: Payer: Self-pay | Admitting: Family

## 2018-04-30 ENCOUNTER — Encounter: Payer: Self-pay | Admitting: Internal Medicine

## 2018-05-07 MED FILL — AMLODIPINE BESYLATE 5 MG TA: 5 | 30 days supply | Qty: 30 | Fill #2

## 2018-05-07 MED FILL — GABAPENTIN 300 MG CAPSULE: 300 | 30 days supply | Qty: 90 | Fill #2

## 2018-05-14 MED FILL — busPIRone HCL 10 MG TABS: 10 | 30 days supply | Qty: 30 | Fill #0

## 2018-07-04 ENCOUNTER — Ambulatory Visit: Payer: Self-pay | Admitting: Family Medicine

## 2019-04-16 ENCOUNTER — Ambulatory Visit: Payer: Self-pay | Admitting: Family Medicine

## 2019-04-17 NOTE — Telephone Encounter (Signed)
Message sent to provider 

## 2019-04-30 ENCOUNTER — Other Ambulatory Visit: Payer: Self-pay

## 2019-04-30 ENCOUNTER — Ambulatory Visit (HOSPITAL_COMMUNITY)
Admission: EM | Admit: 2019-04-30 | Discharge: 2019-04-30 | Disposition: A | Discharge status: Home / Self Care | Payer: Medicare Other | Attending: Family Medicine | Admitting: Family Medicine

## 2019-04-30 ENCOUNTER — Encounter (HOSPITAL_COMMUNITY): Payer: Self-pay

## 2019-04-30 DIAGNOSIS — I10 Essential (primary) hypertension: Secondary | ICD-10-CM

## 2019-04-30 DIAGNOSIS — R4589 Other symptoms and signs involving emotional state: Secondary | ICD-10-CM

## 2019-04-30 DIAGNOSIS — J309 Allergic rhinitis, unspecified: Secondary | ICD-10-CM

## 2019-04-30 DIAGNOSIS — F418 Other specified anxiety disorders: Secondary | ICD-10-CM

## 2019-04-30 DIAGNOSIS — Z20828 Contact with and (suspected) exposure to other viral communicable diseases: Secondary | ICD-10-CM | POA: Diagnosis not present

## 2019-04-30 DIAGNOSIS — H5789 Other specified disorders of eye and adnexa: Secondary | ICD-10-CM

## 2019-04-30 DIAGNOSIS — Z760 Encounter for issue of repeat prescription: Secondary | ICD-10-CM

## 2019-04-30 MED ORDER — BUSPIRONE HCL 10 MG PO TABS: 5.0000 mg | ORAL_TABLET | Freq: Two times a day (BID) | ORAL | 1 refills | Status: DC

## 2019-04-30 MED ORDER — CETIRIZINE HCL 10 MG PO TABS: 10.0000 mg | ORAL_TABLET | Freq: Every day | ORAL | 1 refills | Status: DC

## 2019-04-30 MED ORDER — AMLODIPINE BESYLATE 5 MG PO TABS: 5.0000 mg | ORAL_TABLET | Freq: Every day | ORAL | 1 refills | Status: DC

## 2019-04-30 MED ORDER — GABAPENTIN 300 MG PO CAPS: 300.0000 mg | ORAL_CAPSULE | Freq: Three times a day (TID) | ORAL | 1 refills | Status: DC

## 2019-04-30 NOTE — ED Triage Notes (Addendum)
Pt presents with complaints of redness in right eye that started this AM. Reports using allergy drops with no relief. Denies any pain or vision loss.  Pt reports needing all home medications refilled.

## 2019-04-30 NOTE — Discharge Instructions (Signed)
Schedule an appointment with your eye doctor if your current symptoms are worsening over the next 24 hours.

## 2019-04-30 NOTE — ED Notes (Signed)
Patient on the phone and requested to have someone go ahead of her while she finished her phone call.

## 2019-05-06 NOTE — ED Provider Notes (Signed)
[facility]   [id number] 04/30/19 Arrival Time: 0936  ASSESSMENT & PLAN:  1. Redness of right eye   2. Essential hypertension   3. Anxiety about health   4. Allergic rhinitis, unspecified seasonality, unspecified trigger   5. Medication refill    Eye redness likely related to seasonal allergies. No sign of infection. Discussed.  Refilled requested medication: Meds ordered this encounter  Medications  . amLODipine (NORVASC) 5 MG tablet    Sig: Take 1 tablet (5 mg total) by mouth daily.    Dispense:  30 tablet    Refill:  1  . gabapentin (NEURONTIN) 300 MG capsule    Sig: Take 1 capsule (300 mg total) by mouth 3 (three) times daily.    Dispense:  90 capsule    Refill:  1  . busPIRone (BUSPAR) 10 MG tablet    Sig: Take 0.5 tablets (5 mg total) by mouth 2 (two) times daily.    Dispense:  30 tablet    Refill:  1  . cetirizine (ZYRTEC) 10 MG tablet    Sig: Take 1 tablet (10 mg total) by mouth daily.    Dispense:  30 tablet    Refill:  1   Follow-up Information    [facility].   Specialty:  Urgent Care Why:  As needed. Contact information: [address] [place] [id number] [phone]         Trying to est care with PCP.  Reviewed expectations re: course of current medical issues. Questions answered. Outlined signs and symptoms indicating need for more acute intervention. Patient verbalized understanding. After Visit Summary given.   SUBJECTIVE:  Renee Murray is a 63 y.o. female who presents with complaint of intermittent irritation/redness of her R eye.  Onset gradual. Has noticed off/on over the past few weeks. Sneezing more than usual. Occasional watery drainage from eye. No photophobia. Injury: no. Visual changes: no. Contact lens use: no. Recent illness: no. Self treatment: none reported.  Increased blood pressure noted today. Reports that she has been treated for hypertension in the  past. Out of medication.  She reports no chest pain on exertion, no dyspnea on exertion, no swelling of ankles, no orthostatic dizziness or lightheadedness, no orthopnea or paroxysmal nocturnal dyspnea, no palpitations and no intermittent claudication symptoms.  ROS: As per HPI. All other systems negative.   OBJECTIVE:  Vitals:   04/30/19 1014  BP: (!) 155/81  Pulse: 64  Resp: 18  Temp: 97.9 F (36.6 C)  SpO2: 96%    General appearance: alert; no distress HEENT: [place]; AT; PERRLA; EOMI OS: without reported pain; without conjunctival injection; without drainage; without corneal opacities; without limbal flush; without periorbital swelling or erythema OD: without reported pain; with mild conjunctival injection; without drainage; without corneal opacities; without limbal flush; without periorbital swelling or erythema Neck: supple without LAD Lungs: clear to auscultation bilaterally; unlabored respirations Heart: regular rate and rhythm Ext: No LE edema Skin: warm and dry Psychological: alert and cooperative; normal mood and affect     Allergies  Allergen Reactions  . Demerol Other (See Comments)    "almost died"  . Morphine And Related Other (See Comments)    "Almost Died"    Past Medical History:  Diagnosis Date  . Brain aneurysm   . Hypertension    Social History   Socioeconomic History  . Marital status: Divorced    Spouse name: Not on file  . Number of children:  Not on file  . Years of education: Not on file  . Highest education level: Not on file  Occupational History  . Not on file  Social Needs  . Financial resource strain: Not on file  . Food insecurity:    Worry: Not on file    Inability: Not on file  . Transportation needs:    Medical: Not on file    Non-medical: Not on file  Tobacco Use  . Smoking status: Current Every Day Smoker    Packs/day: 0.25    Years: 20.00    Pack years: 5.00    Types: Cigarettes  . Smokeless tobacco: Never Used   Substance and Sexual Activity  . Alcohol use: No  . Drug use: No  . Sexual activity: Not on file  Lifestyle  . Physical activity:    Days per week: Not on file    Minutes per session: Not on file  . Stress: Not on file  Relationships  . Social connections:    Talks on phone: Not on file    Gets together: Not on file    Attends religious service: Not on file    Active member of club or organization: Not on file    Attends meetings of clubs or organizations: Not on file    Relationship status: Not on file  . Intimate partner violence:    Fear of current or ex partner: Not on file    Emotionally abused: Not on file    Physically abused: Not on file    Forced sexual activity: Not on file  Other Topics Concern  . Not on file  Social History Narrative  . Not on file   Family History  Problem Relation Age of Onset  . Diabetes Mother   . Heart failure Mother   . Cancer Father    Past Surgical History:  Procedure Laterality Date  . ABDOMINAL HYSTERECTOMY    . CHOLECYSTECTOMY    . CRANIOTOMY Right 02/04/2015   Procedure: RIGHT CRANIOTOMY FOR ANEURYSM ;  Surgeon: Lisbeth Renshaw, MD;  Location: [facility];  Service: Neurosurgery;  Laterality: Right;  . IR GENERIC HISTORICAL  02/04/2015   IR ANGIO INTRA EXTRACRAN SEL COM CAROTID INNOMINATE BILAT MOD SED 02/04/2015 Lisbeth Renshaw, MD [facility]  . IR GENERIC HISTORICAL  02/04/2015   IR ANGIO VERTEBRAL SEL SUBCLAVIAN INNOMINATE UNI R MOD SED 02/04/2015 Lisbeth Renshaw, MD [facility]  . IR GENERIC HISTORICAL  02/04/2015   IR ANGIO VERTEBRAL SEL VERTEBRAL UNI L MOD SED 02/04/2015 Lisbeth Renshaw, MD [facility]     Mardella Layman, MD 05/06/19 [phone]

## 2019-05-19 ENCOUNTER — Ambulatory Visit: Payer: Medicare Other | Admitting: Family Medicine

## 2019-05-19 ENCOUNTER — Other Ambulatory Visit: Payer: Self-pay

## 2019-05-22 ENCOUNTER — Other Ambulatory Visit: Payer: Self-pay

## 2019-05-22 ENCOUNTER — Ambulatory Visit: Payer: Medicare Other | Attending: Family Medicine | Admitting: Family Medicine

## 2019-05-22 ENCOUNTER — Encounter: Payer: Self-pay | Admitting: Family Medicine

## 2019-05-22 DIAGNOSIS — F418 Other specified anxiety disorders: Secondary | ICD-10-CM

## 2019-05-22 DIAGNOSIS — R200 Anesthesia of skin: Secondary | ICD-10-CM

## 2019-05-22 DIAGNOSIS — R7303 Prediabetes: Secondary | ICD-10-CM

## 2019-05-22 DIAGNOSIS — J309 Allergic rhinitis, unspecified: Secondary | ICD-10-CM

## 2019-05-22 DIAGNOSIS — Z8679 Personal history of other diseases of the circulatory system: Secondary | ICD-10-CM | POA: Diagnosis not present

## 2019-05-22 DIAGNOSIS — R202 Paresthesia of skin: Secondary | ICD-10-CM | POA: Diagnosis not present

## 2019-05-22 DIAGNOSIS — R4589 Other symptoms and signs involving emotional state: Secondary | ICD-10-CM

## 2019-05-22 DIAGNOSIS — I10 Essential (primary) hypertension: Secondary | ICD-10-CM

## 2019-05-22 MED ORDER — AMLODIPINE BESYLATE 5 MG PO TABS: 5.0000 mg | ORAL_TABLET | Freq: Every day | ORAL | 5 refills | Status: DC

## 2019-05-22 MED ORDER — BUSPIRONE HCL 10 MG PO TABS: 5.0000 mg | ORAL_TABLET | Freq: Two times a day (BID) | ORAL | 1 refills | Status: DC

## 2019-05-22 NOTE — Progress Notes (Signed)
Virtual Visit via Telephone Note  I connected with Renee Murray on 05/22/19 at  4:10 PM EDT by telephone and verified that I am speaking with the correct person using two identifiers.   I discussed the limitations, risks, security and privacy concerns of performing an evaluation and management service by telephone and the availability of in person appointments. I also discussed with the patient that there may be a patient responsible charge related to this service. The patient expressed understanding and agreed to proceed.  Patient Location: Home Provider Location: Office Others participating in call: Guillermina [place], CMA   History of Present Illness:      63 yo female who was last seen at the [facility] on 01/04/2018 in follow-up of Hypertension. Patient with a history of Hypertension, brain aneurysm, neuropathy and pre-diabetes. She reports that she no longer had transportation to her prior caregivers office and she had been taking her medications sporadically in order to stretch them.  She also reports that she missed her last neurosurgery visit in follow-up of her prior brain aneurysm.  She has had no significant headaches or dizziness.  No focal weakness.  She has had issues with numbness and tingling in both hands.  She states that she has been told that it was carpal tunnel syndrome but then told by another doctor that she did not have carpal tunnel syndrome.  She has taken gabapentin and sometimes uses an over-the-counter arthritis cream which has helped.  She would however like to know why she has the numbness and tingling in her hands.  She continues to be anxious about her health especially since her aneurysm and patient has been taking BuSpar and would like to have a refill of this medication to help with her anxiety.  She also has issues with allergic rhinitis with clear nasal discharge and sneezing.  She takes Flonase as needed and sometimes Zyrtec.  Upon  questioning she does recall being told that she was at increased risk of developing diabetes.  She denies any increased thirst or urinary frequency.  She does sometimes feel fatigued.  She feels that she is stable at this time overall.  She has had no recent fever or chills, no shortness of breath or cough, no chest pain or palpitations, no peripheral edema and no abdominal pain-no nausea/vomiting/diarrhea or constipation.  No dysuria and no muscle or joint pain other than the numbness in her hands.        Past Medical History:  Diagnosis Date   Brain aneurysm    Hypertension     Past Surgical History:  Procedure Laterality Date   ABDOMINAL HYSTERECTOMY     CHOLECYSTECTOMY     CRANIOTOMY Right 02/04/2015   Procedure: RIGHT CRANIOTOMY FOR ANEURYSM ;  Surgeon: Lisbeth Renshaw, MD;  Location: [facility];  Service: Neurosurgery;  Laterality: Right;   IR GENERIC HISTORICAL  02/04/2015   IR ANGIO INTRA EXTRACRAN SEL COM CAROTID INNOMINATE BILAT MOD SED 02/04/2015 Lisbeth Renshaw, MD [facility]   IR GENERIC HISTORICAL  02/04/2015   IR ANGIO VERTEBRAL SEL SUBCLAVIAN INNOMINATE UNI R MOD SED 02/04/2015 Lisbeth Renshaw, MD [facility]   IR GENERIC HISTORICAL  02/04/2015   IR ANGIO VERTEBRAL SEL VERTEBRAL UNI L MOD SED 02/04/2015 Lisbeth Renshaw, MD [facility]    Family History  Problem Relation Age of Onset   Diabetes Mother    Heart failure Mother    Cancer Father     Social History  Tobacco Use   Smoking status: Current Every Day Smoker    Packs/day: 0.25    Years: 20.00    Pack years: 5.00    Types: Cigarettes   Smokeless tobacco: Never Used  Substance Use Topics   Alcohol use: No   Drug use: No     Allergies  Allergen Reactions   Demerol Other (See Comments)    "almost died"   Morphine And Related Other (See Comments)    "Almost Died"       Observations/Objective: No vital signs or physical exam conducted as visit was done via  telephone  Assessment and Plan: 1. Essential hypertension Prescription refilled for amlodipine and importance of controlling the blood pressure especially in light of patient's prior subarachnoid hemorrhage and cerebral aneurysm discussed.  Discussed goal blood pressure of 130/80 or less.  DASH diet discussed and encouraged.  Continue to monitor home blood pressure - amLODipine (NORVASC) 5 MG tablet; Take 1 tablet (5 mg total) by mouth daily. To lower blood pressure  Dispense: 30 tablet; Refill: 5  2. Numbness and tingling in both hands Patient with complaint of numbness and tingling in her hands which likely represents carpal tunnel syndrome that could be radicular.  Referral placed to hand surgery - Ambulatory referral to Hand Surgery  3. History of spontaneous subarachnoid intracranial hemorrhage due to cerebral aneurysm Patient with history of a cerebral aneurysm causing a subarachnoid hemorrhage.  Patient reports that she missed her last visit with neurosurgery.  New appointment will be scheduled for further follow-up and discussed importance of controlling her blood pressure.  CBC at upcoming lab visit to look for anemia/blood loss - Ambulatory referral to Neurosurgery - CBC with Differential; Future  4.  Prediabetes Patient with hemoglobin A1c on 01/04/2018 of 5.8 consistent with prediabetes.  Patient will return to clinic for lab work and at that time will have repeat point-of-care hemoglobin A1c and BMP in follow-up of prediabetes.  Low carbohydrate diet and exercise as tolerated encouraged - Basic metabolic panel; Future - Hemoglobin A1c; Future  5. Anxiety about health Patient reports continued anxiety regarding her health and she is provided with refill of her BuSpar 5 mg twice daily.  Patient also made aware that referral for counseling can be made if this is something that she is interested in our in the future - busPIRone (BUSPAR) 10 MG tablet; Take 0.5 tablets (5 mg total) by  mouth 2 (two) times daily.  Dispense: 30 tablet; Refill: 1   6. Allergic rhinitis, unspecified seasonality, unspecified trigger Patient made aware that both Flonase and Zyrtec are currently available over-the-counter but she may wish to call this pharmacy to see if she can obtain these medications at a cheaper price than she would at an outside pharmacy.  Follow Up Instructions:Return in about 4 months (around 09/21/2019) for chronic issues-labs in 1-2 weeks.    I discussed the assessment and treatment plan with the patient. The patient was provided an opportunity to ask questions and all were answered. The patient agreed with the plan and demonstrated an understanding of the instructions.   The patient was advised to call back or seek an in-person evaluation if the symptoms worsen or if the condition fails to improve as anticipated.  I provided 14 minutes of non-face-to-face time during this encounter.   Cain Saupeammie Antonius Hartlage, MD

## 2019-05-22 NOTE — Progress Notes (Signed)
New patient and est care and to get a provider to refill all of her medications.   Talk about stop smoking  recent colonoscopy-----she is not sure how many years ago  Per pt chart, there was a Cologuard that was done 01-21-18 and it was Positive

## 2019-06-01 ENCOUNTER — Encounter: Payer: Self-pay | Admitting: Family Medicine

## 2019-09-10 ENCOUNTER — Encounter (HOSPITAL_COMMUNITY): Payer: Self-pay

## 2019-09-10 ENCOUNTER — Other Ambulatory Visit: Payer: Self-pay

## 2019-09-10 ENCOUNTER — Ambulatory Visit (HOSPITAL_COMMUNITY)
Admission: EM | Admit: 2019-09-10 | Discharge: 2019-09-10 | Disposition: A | Discharge status: Home / Self Care | Payer: Medicare Other | Attending: Family Medicine | Admitting: Family Medicine

## 2019-09-10 DIAGNOSIS — I10 Essential (primary) hypertension: Secondary | ICD-10-CM | POA: Diagnosis not present

## 2019-09-10 DIAGNOSIS — M545 Low back pain, unspecified: Secondary | ICD-10-CM

## 2019-09-10 LAB — POCT URINALYSIS DIP (DEVICE)
Bilirubin Urine: NEGATIVE
Glucose, UA: NEGATIVE mg/dL
Hgb urine dipstick: NEGATIVE
Ketones, ur: NEGATIVE mg/dL
Leukocytes,Ua: NEGATIVE
Nitrite: NEGATIVE
Protein, ur: NEGATIVE mg/dL
Specific Gravity, Urine: 1.015 (ref 1.005–1.030)
Urobilinogen, UA: 0.2 mg/dL (ref 0.0–1.0)
pH: 7.5 (ref 5.0–8.0)

## 2019-09-10 MED ORDER — NAPROXEN 500 MG PO TABS: 500.0000 mg | ORAL_TABLET | Freq: Two times a day (BID) | ORAL | 0 refills | Status: DC

## 2019-09-10 MED ORDER — METHOCARBAMOL 500 MG PO TABS: 500.0000 mg | ORAL_TABLET | Freq: Two times a day (BID) | ORAL | 0 refills | Status: DC | PRN

## 2019-09-10 MED ORDER — METHYLPREDNISOLONE ACETATE 80 MG/ML IJ SUSP
INTRAMUSCULAR | Status: AC
  Filled 2019-09-10: qty 1

## 2019-09-10 MED ORDER — METHYLPREDNISOLONE ACETATE 80 MG/ML IJ SUSP
80.0000 mg | Freq: Once | INTRAMUSCULAR | Status: AC
  Administered 2019-09-10: 80 mg via INTRAMUSCULAR

## 2019-09-10 NOTE — ED Notes (Signed)
Patient able to ambulate independently  

## 2019-09-10 NOTE — ED Triage Notes (Signed)
Pt states she has back pain. Pt stated she has tried the OTC meds. Pt states she has radiating pain. Pt thinks she may have a UTI.

## 2019-09-10 NOTE — Discharge Instructions (Signed)
We gave you an injection of Depo-Medrol to help with your back pain You may supplement with Robaxin as needed twice daily, may cause slight drowsiness as this is a muscle relaxer You may supplement with Tylenol 1000 mg every 6 hours or Naprosyn twice daily for pain with food  Please follow-up if symptoms not resolving worsening or changing

## 2019-09-10 NOTE — ED Provider Notes (Signed)
[facility]    CSN: [id number] Arrival date & time: 09/10/19  1503      History   Chief Complaint Chief Complaint  Patient presents with  . Back Pain    HPI Renee Murray is a 63 y.o. female history of hypertension, brain aneurysm, prediabetes, presenting today for evaluation of back pain.  Patient states that on Saturday she started to develop some back pain in her lower back bilaterally.  This is persisted over the past 4 to 5 days.  She denies any specific injury or inciting incident, but does relate the day prior she cut the grass which was thicker than normal and feels she had to push harder than normal.  She also notes that she has been moving some furniture as she has having work done in her house.  She has radiation of pain bilaterally into her lower abdomen/groin region.  Denies radiation down her legs.  Does not feel similar to when she has had sciatica in the past.  Denies weakness in legs.  Denies issues with urination or bowel movements.  She is concerned about possible UTI though.  Denies dysuria, increased frequency, cloudy urine or odor.  She denies blood in the urine.  Has previously had possible kidney stone.  Denies fever nausea or vomiting.  Does feel also since Friday she has had decreased bowel movements.  Is not eating her typical oatmeal/raisin breakfast.  She has been taking ibuprofen 800 and Tylenol 1000 mg with temporary relief.  HPI  Past Medical History:  Diagnosis Date  . Brain aneurysm   . Hypertension     Patient Active Problem List   Diagnosis Date Noted  . Acute midline low back pain 12/27/2016  . Acute pain of both shoulders 12/27/2016  . Rotator cuff impingement syndrome 12/27/2016  . Essential hypertension 01/25/2016  . Chronic pain syndrome 01/25/2016  . Prediabetes 10/13/2015  . Anxiety about health 09/13/2015  . Neuropathy 09/13/2015  . Chronic generalized pain 09/13/2015  . History of seasonal allergies 09/13/2015  . SAH  (subarachnoid hemorrhage) ([place])   . Subarachnoid hemorrhage ([place]) 02/04/2015  . History of sinus disease on CT 08/10/2014  . Hypertension, goal below 150/90 03/17/2014  . Health care maintenance 03/17/2014  . Tobacco abuse 03/17/2014  . Neuropathy, upper extremity bilaterally  08/06/2013    Past Surgical History:  Procedure Laterality Date  . ABDOMINAL HYSTERECTOMY    . CHOLECYSTECTOMY    . CRANIOTOMY Right 02/04/2015   Procedure: RIGHT CRANIOTOMY FOR ANEURYSM ;  Surgeon: Consuella Lose, MD;  Location: [facility];  Service: Neurosurgery;  Laterality: Right;  . IR GENERIC HISTORICAL  02/04/2015   IR ANGIO INTRA EXTRACRAN SEL COM CAROTID INNOMINATE BILAT MOD SED 02/04/2015 Consuella Lose, MD [facility]  . IR GENERIC HISTORICAL  02/04/2015   IR ANGIO VERTEBRAL SEL SUBCLAVIAN INNOMINATE UNI R MOD SED 02/04/2015 Consuella Lose, MD [facility]  . IR GENERIC HISTORICAL  02/04/2015   IR ANGIO VERTEBRAL SEL VERTEBRAL UNI L MOD SED 02/04/2015 Consuella Lose, MD [facility]    OB History   No obstetric history on file.      Home Medications    Prior to Admission medications   Medication Sig Start Date End Date Taking? Authorizing Provider  amLODipine (NORVASC) 5 MG tablet Take 1 tablet (5 mg total) by mouth daily. To lower blood pressure 05/22/19   Fulp, Cammie, MD  busPIRone (BUSPAR) 10 MG tablet Take 0.5 tablets (5 mg total) by mouth 2 (  two) times daily. 05/22/19   Fulp, Cammie, MD  cetirizine (ZYRTEC) 10 MG tablet Take 1 tablet (10 mg total) by mouth daily. 04/30/19   Mardella Layman, MD  fluticasone (FLONASE) 50 MCG/ACT nasal spray Place 2 sprays into both nostrils daily. Patient taking differently: Place 2 sprays into both nostrils daily as needed for allergies or rhinitis.  07/26/16   Henrietta Hoover, NP  gabapentin (NEURONTIN) 300 MG capsule Take 1 capsule (300 mg total) by mouth 3 (three) times daily. 04/30/19   Mardella Layman, MD  ibuprofen (ADVIL,MOTRIN) 600 MG tablet Take  1 tablet (600 mg total) by mouth every 8 (eight) hours as needed for mild pain or moderate pain. 12/04/16   Massie Maroon, FNP  methocarbamol (ROBAXIN) 500 MG tablet Take 1 tablet (500 mg total) by mouth 2 (two) times daily as needed for muscle spasms. 09/10/19   ,  C, PA-C  naproxen (NAPROSYN) 500 MG tablet Take 1 tablet (500 mg total) by mouth 2 (two) times daily. 09/10/19   ,  C, PA-C  trolamine salicylate (ASPERCREME) 10 % cream Apply 1 application topically as needed for muscle pain.    [provider]    Family History Family History  Problem Relation Age of Onset  . Diabetes Mother   . Heart failure Mother   . Cancer Father     Social History Social History   Tobacco Use  . Smoking status: Current Every Day Smoker    Packs/day: 0.25    Years: 20.00    Pack years: 5.00    Types: Cigarettes  . Smokeless tobacco: Never Used  Substance Use Topics  . Alcohol use: No  . Drug use: No     Allergies   Demerol and Morphine and related   Review of Systems Review of Systems  Constitutional: Negative for fever.  Respiratory: Negative for shortness of breath.   Cardiovascular: Negative for chest pain.  Gastrointestinal: Negative for abdominal pain, diarrhea, nausea and vomiting.  Genitourinary: Negative for dysuria, flank pain, genital sores, hematuria, menstrual problem, vaginal bleeding, vaginal discharge and vaginal pain.  Musculoskeletal: Positive for back pain.  Skin: Negative for rash.  Neurological: Negative for dizziness, light-headedness and headaches.     Physical Exam Triage Vital Signs ED Triage Vitals  Enc Vitals Group     BP 09/10/19 1540 (!) 160/73     Pulse Rate 09/10/19 1540 68     Resp 09/10/19 1540 18     Temp 09/10/19 1540 97.7 F (36.5 C)     Temp Source 09/10/19 1540 Oral     SpO2 09/10/19 1540 98 %     Weight 09/10/19 1538 154 lb (69.9 kg)     Height --      Head Circumference --      Peak Flow --       Pain Score 09/10/19 1538 8     Pain Loc --      Pain Edu? --      Excl. in GC? --    No data found.  Updated Vital Signs BP (!) 160/73 (BP Location: Right Arm)   Pulse 68   Temp 97.7 F (36.5 C) (Oral)   Resp 18   Wt 154 lb (69.9 kg)   SpO2 98%   BMI 31.10 kg/m   Visual Acuity Right Eye Distance:   Left Eye Distance:   Bilateral Distance:    Right Eye Near:   Left Eye Near:    Bilateral Near:  Physical Exam Vitals signs and nursing note reviewed.  Constitutional:      General: She is not in acute distress.    Appearance: She is well-developed.  HENT:     Head: Normocephalic and atraumatic.  Eyes:     Conjunctiva/sclera: Conjunctivae normal.  Neck:     Musculoskeletal: Neck supple.  Cardiovascular:     Rate and Rhythm: Normal rate and regular rhythm.     Heart sounds: No murmur.  Pulmonary:     Effort: Pulmonary effort is normal. No respiratory distress.     Breath sounds: Normal breath sounds.  Abdominal:     Palpations: Abdomen is soft.     Tenderness: There is no abdominal tenderness.  Musculoskeletal:     Comments: Nontender to palpation of entire cervical, thoracic and lumbar spine midline, nontender throughout palpation of bilateral lumbar musculature although patient does point to this area as area of discomfort.  Negative straight leg raise bilaterally  Strength 5/5 and equal bilaterally at hips and knees, hip flexion bilaterally does trigger pain Patellar reflexes difficult to obtain bilaterally Gait without abnormality  Skin:    General: Skin is warm and dry.  Neurological:     Mental Status: She is alert.      UC Treatments / Results  Labs (all labs ordered are listed, but only abnormal results are displayed) Labs Reviewed  POCT URINALYSIS DIP (DEVICE)    EKG   Radiology No results found.  Procedures Procedures (including critical care time)  Medications Ordered in UC Medications  methylPREDNISolone acetate (DEPO-MEDROL)  injection 80 mg (80 mg Intramuscular Given 09/10/19 1708)  methylPREDNISolone acetate (DEPO-MEDROL) 80 MG/ML injection (has no administration in time range)    Initial Impression / Assessment and Plan / UC Course  I have reviewed the triage vital signs and the nursing notes.  Pertinent labs & imaging results that were available during my care of the patient were reviewed by me and considered in my medical decision making (see chart for details).     UA negative for leuks, nitrites as well as hemoglobin.  Less likely kidney stone, UTI as cause of symptoms.  Given increased activity prior to onset, feel this likely is still MSK pain.  Will try steroids given not improving with NSAIDs.  Wishing to avoid oral steroids, will provide Depo-Medrol as alternative.  Providing Robaxin to use as needed in order to minimize sedation.  Patient expresses significant drowsiness with Flexeril.  Will provide Naprosyn as alternative to supplement as needed.  Discussed activity modification.  Discussed strict return precautions. Patient verbalized understanding and is agreeable with plan.  Final Clinical Impressions(s) / UC Diagnoses   Final diagnoses:  Acute bilateral low back pain without sciatica     Discharge Instructions     We gave you an injection of Depo-Medrol to help with your back pain You may supplement with Robaxin as needed twice daily, may cause slight drowsiness as this is a muscle relaxer You may supplement with Tylenol 1000 mg every 6 hours or Naprosyn twice daily for pain with food  Please follow-up if symptoms not resolving worsening or changing   ED Prescriptions    Medication Sig Dispense Auth. Provider   naproxen (NAPROSYN) 500 MG tablet Take 1 tablet (500 mg total) by mouth 2 (two) times daily. 30 tablet ,  C, PA-C   methocarbamol (ROBAXIN) 500 MG tablet Take 1 tablet (500 mg total) by mouth 2 (two) times daily as needed for muscle spasms. 20 tablet ,   C,  PA-C     PDMP not reviewed this encounter.   Lew DawesWieters,  C, [place]PA-C 09/10/19 [phone]

## 2019-09-25 DIAGNOSIS — Z20828 Contact with and (suspected) exposure to other viral communicable diseases: Secondary | ICD-10-CM | POA: Diagnosis not present

## 2019-10-16 ENCOUNTER — Encounter: Payer: Self-pay | Admitting: Family Medicine

## 2019-10-16 ENCOUNTER — Other Ambulatory Visit: Payer: Self-pay

## 2019-10-16 ENCOUNTER — Ambulatory Visit: Payer: Medicare Other | Attending: Family Medicine | Admitting: Family Medicine

## 2019-10-16 VITALS — BP 151/69 | HR 66 | Resp 18 | Ht 59.0 in | Wt 152.0 lb

## 2019-10-16 DIAGNOSIS — I10 Essential (primary) hypertension: Secondary | ICD-10-CM

## 2019-10-16 DIAGNOSIS — R7303 Prediabetes: Secondary | ICD-10-CM

## 2019-10-16 DIAGNOSIS — Z79899 Other long term (current) drug therapy: Secondary | ICD-10-CM | POA: Diagnosis not present

## 2019-10-16 DIAGNOSIS — Z8679 Personal history of other diseases of the circulatory system: Secondary | ICD-10-CM | POA: Diagnosis not present

## 2019-10-16 MED ORDER — AMLODIPINE BESYLATE 10 MG PO TABS: 10.0000 mg | ORAL_TABLET | Freq: Every day | ORAL | 1 refills | Status: DC

## 2019-10-16 NOTE — Progress Notes (Signed)
Virtual Visit via Telephone Note  I connected with Renee Murray on 10/16/19 at  8:30 AM EDT by telephone and verified that I am speaking with the correct person using two identifiers.   I discussed the limitations, risks, security and privacy concerns of performing an evaluation and management service by telephone and the availability of in person appointments. I also discussed with the patient that there may be a patient responsible charge related to this service. The patient expressed understanding and agreed to proceed.  Patient Location: Home Provider Location: [facility] Others participating in call: call initiated by [place], CMA then transferred to me   History of Present Illness:       63 yo female with history of hypertension, prediabetes and prior brain aneurysm who is seen in follow-up of chronic medical issues.  She had new patient telemedicine visit on 05/22/2019 to establish care but did not return for blood work that had been ordered in follow-up of her visit.  She is also status post recent urgent care visit on 09/10/2019 due to low back pain.  Patient thinks that the back pain was due to overexerting herself as she reports that she had some contractors at her home doing work and patient moved around furniture to get it out of their way.  She thought that the muscle relaxants made her feel funny but the naproxen has helped with her pain which has improved.        Patient reports no headaches or dizziness related to her blood pressure or prior history of brain aneurysm/intracranial hemorrhage.  She reports that her blood pressure at home this morning was 151/69.  She states that her blood pressures today around this range.  She continues to take amlodipine 5 mg.  She denies any issues with chest pain or palpitations, no shortness of breath or cough, no abdominal pain-no nausea or vomiting.  No dizziness.  No peripheral edema.         She also has history of prediabetes with  hemoglobin A1c of 5.8 in January.  Patient did not return for hemoglobin A1c and BMP which had been ordered in follow-up.  She denies any increased thirst or urinary frequency at this time.            Past Medical History:  Diagnosis Date  . Brain aneurysm   . Hypertension     Past Surgical History:  Procedure Laterality Date  . ABDOMINAL HYSTERECTOMY    . CHOLECYSTECTOMY    . CRANIOTOMY Right 02/04/2015   Procedure: RIGHT CRANIOTOMY FOR ANEURYSM ;  Surgeon: Consuella Lose, MD;  Location: [facility];  Service: Neurosurgery;  Laterality: Right;  . IR GENERIC HISTORICAL  02/04/2015   IR ANGIO INTRA EXTRACRAN SEL COM CAROTID INNOMINATE BILAT MOD SED 02/04/2015 Consuella Lose, MD [facility]  . IR GENERIC HISTORICAL  02/04/2015   IR ANGIO VERTEBRAL SEL SUBCLAVIAN INNOMINATE UNI R MOD SED 02/04/2015 Consuella Lose, MD [facility]  . IR GENERIC HISTORICAL  02/04/2015   IR ANGIO VERTEBRAL SEL VERTEBRAL UNI L MOD SED 02/04/2015 Consuella Lose, MD [facility]    Family History  Problem Relation Age of Onset  . Diabetes Mother   . Heart failure Mother   . Cancer Father     Social History   Tobacco Use  . Smoking status: Current Every Day Smoker    Packs/day: 0.25    Years: 20.00    Pack years: 5.00    Types: Cigarettes  .  Smokeless tobacco: Never Used  Substance Use Topics  . Alcohol use: No  . Drug use: No     Allergies  Allergen Reactions  . Demerol Other (See Comments)    "almost died"  . Morphine And Related Other (See Comments)    "Almost Died"       Observations/Objective: No vital signs or physical exam conducted as visit was done via telephone  Assessment and Plan:  1. Essential hypertension Patient reports that her blood sugars stay in the 150s over 70s.  Patient was asked to increase her amlodipine from current 5 mg to 10 mg daily to help get blood pressure to goal of 130/80.  She was also asked to have blood pressure rechecked in about 4 weeks to  make sure that blood pressure has now decreased.  Patient was hesitant to increase her dose but again discussed with the patient that with her history of aneurysm that I would like to get her blood pressure to around 130/80 or less of the goal.  Will place referral to clinical pharmacist to make sure that patient has follow-up of blood pressure and that blood pressure is at or near goal of 130/80.  Patient reports that she can come in next week for blood work and orders placed for lipid panel and comprehensive metabolic panel. - amLODipine (NORVASC) 10 MG tablet; Take 1 tablet (10 mg total) by mouth daily. To lower blood pressure  Dispense: 90 tablet; Refill: 1 - Lipid panel; Future - Comprehensive metabolic panel; Future  2. History of spontaneous subarachnoid intracranial hemorrhage due to cerebral aneurysm At her last visit, patient was referred back to her neurosurgeon as she reported that she had missed her follow-up appointment regarding her history of subarachnoid intracranial hemorrhage due to cerebral aneurysm.  Discussed importance of maintaining control blood pressure.  Patient is encouraged to make sure that she has continued follow-up with neurosurgeon or interventional radiologist. - Comprehensive metabolic panel; Future  3. Prediabetes Patient with prediabetes with hemoglobin A1c of 5.8 in January of this year.  New order will be placed for patient to have hemoglobin A1c and comprehensive metabolic panel at her upcoming visit - Comprehensive metabolic panel; Future - Hemoglobin A1c; Future  4. Encounter for long-term (current) use of medications Patient will have comprehensive metabolic panel in follow-up of long-term use of medication and also in case statin therapy is needed for treatment of hyperlipidemia as patient will also have lipid panel at her upcoming visit. - Comprehensive metabolic panel; Future  Follow Up Instructions:Return in about 4 months (around 02/15/2020) for  DM/HTN- labs; 4 weeks with CPP Franky Macho).    I discussed the assessment and treatment plan with the patient. The patient was provided an opportunity to ask questions and all were answered. The patient agreed with the plan and demonstrated an understanding of the instructions.   The patient was advised to call back or seek an in-person evaluation if the symptoms worsen or if the condition fails to improve as anticipated.  I provided 14 minutes of non-face-to-face time during this encounter.   Cain Saupe, MD

## 2019-10-16 NOTE — Progress Notes (Signed)
Patient verified DOB Patient is fixing breakfast now Patient will take medication after breakfast. Patient denies pain at this time.

## 2019-10-21 ENCOUNTER — Other Ambulatory Visit: Payer: Self-pay

## 2019-10-21 ENCOUNTER — Ambulatory Visit: Payer: Medicare Other | Attending: Family Medicine

## 2019-10-21 DIAGNOSIS — I10 Essential (primary) hypertension: Secondary | ICD-10-CM | POA: Diagnosis not present

## 2019-10-21 DIAGNOSIS — Z79899 Other long term (current) drug therapy: Secondary | ICD-10-CM

## 2019-10-21 DIAGNOSIS — Z8679 Personal history of other diseases of the circulatory system: Secondary | ICD-10-CM

## 2019-10-21 DIAGNOSIS — R7303 Prediabetes: Secondary | ICD-10-CM | POA: Diagnosis not present

## 2019-10-22 LAB — COMPREHENSIVE METABOLIC PANEL WITH GFR
ALT: 16 IU/L (ref 0–32)
AST: 15 IU/L (ref 0–40)
Albumin/Globulin Ratio: 2 (ref 1.2–2.2)
Albumin: 4.4 g/dL (ref 3.8–4.8)
Alkaline Phosphatase: 105 IU/L (ref 39–117)
BUN/Creatinine Ratio: 21 (ref 12–28)
BUN: 12 mg/dL (ref 8–27)
Bilirubin Total: <0.2 mg/dL (ref 0.0–1.2)
CO2: 24 mmol/L (ref 20–29)
Calcium: 9.1 mg/dL (ref 8.7–10.3)
Chloride: 105 mmol/L (ref 96–106)
Creatinine, Ser: 0.58 mg/dL (ref 0.57–1.00)
GFR calc Af Amer: 112 mL/min/1.73
GFR calc non Af Amer: 97 mL/min/1.73
Globulin, Total: 2.2 g/dL (ref 1.5–4.5)
Glucose: 84 mg/dL (ref 65–99)
Potassium: 4.5 mmol/L (ref 3.5–5.2)
Sodium: 142 mmol/L (ref 134–144)
Total Protein: 6.6 g/dL (ref 6.0–8.5)

## 2019-10-22 LAB — HEMOGLOBIN A1C
Est. average glucose Bld gHb Est-mCnc: 180 mg/dL
Hgb A1c MFr Bld: 7.9 % — ABNORMAL HIGH (ref 4.8–5.6)

## 2019-10-22 LAB — LIPID PANEL
Chol/HDL Ratio: 3.3 ratio (ref 0.0–4.4)
Cholesterol, Total: 160 mg/dL (ref 100–199)
HDL: 49 mg/dL
LDL Chol Calc (NIH): 92 mg/dL (ref 0–99)
Triglycerides: 103 mg/dL (ref 0–149)
VLDL Cholesterol Cal: 19 mg/dL (ref 5–40)

## 2019-10-27 ENCOUNTER — Encounter: Payer: Self-pay | Admitting: *Deleted

## 2019-10-28 ENCOUNTER — Telehealth: Payer: Self-pay

## 2019-10-28 ENCOUNTER — Telehealth: Payer: Self-pay | Admitting: Family Medicine

## 2019-10-28 DIAGNOSIS — R7303 Prediabetes: Secondary | ICD-10-CM

## 2019-10-28 MED ORDER — ONETOUCH DELICA PLUS LANCING MISC: 1.0000 | Freq: Every day | 11 refills | Status: DC

## 2019-10-28 MED ORDER — ONETOUCH VERIO VI STRP: ORAL_STRIP | 12 refills | Status: DC

## 2019-10-28 MED ORDER — ONETOUCH VERIO W/DEVICE KIT: PACK | 0 refills | Status: DC

## 2019-10-28 NOTE — Telephone Encounter (Signed)
Spoke with patient and informed her with what provider stated and she verbalized understanding.  Per pt she wants provider to send in prescription for her to be able to check her blood sugar at home. Per pt pharmacy did receive the script for the meter and the supplies but they need information.  Staff called pharmacy and spoke with Luella Cook and she stated that under the SIG they need to have actual directions not Use as Directed due to patient insurance won't pay for it. Per pharmacist, she is needing a new script to be sent with dx code and directions.

## 2019-10-28 NOTE — Telephone Encounter (Signed)
Patient called to see if she will be needing medication for her diabetes. Please f/u

## 2019-10-28 NOTE — Telephone Encounter (Signed)
She has an upcoming visit with the clinical pharmacist, Lurena Joiner, to talk about her diagnosis of diabetes. In the meantime she should follow a low carbohydrate diet- try to avoid foods that contain concentrated sweets such as juices, sweet tea, regular soda and pies/cakes/cookies.  Focus on eliminating breads, pasta and rice or make sure that the portions are small. Consider switching to whole grain bread, brown rice or whole wheat pasta.

## 2019-10-28 NOTE — Telephone Encounter (Signed)
Patient called asking what to do now that she has diabetes,

## 2019-10-28 NOTE — Telephone Encounter (Signed)
Spoke with patient and informed her that message was sent to provider. Per pt she felt like nothing was put in place when she was called with her results as to what she needs to do for being a just diagnosed diabetic. Per pt she wants to know what to do. Per pt she wants to know if she needs to be checking her blood sugar daily, if she needs to be on medications or what. Per patient she have family members that's a diabetic and she's just want to make sure she takes care of herself.   Informed patient message will be sent to provider

## 2019-10-28 NOTE — Telephone Encounter (Signed)
Spoke with patient and informed her that message was sent to provider. Per pt she felt like nothing was put in place when she was called with her results as to what she needs to do for being a just diagnosed diabetic. Per pt she wants to know what to do. Per pt she wants to know if she needs to be checking her blood sugar daily, if she needs to be on medications or what. Per patient she have family members that's a diabetic and she's just want to make sure she takes care of herself.   Informed patient message will be sent to provider 

## 2019-10-29 ENCOUNTER — Other Ambulatory Visit: Payer: Self-pay | Admitting: Family Medicine

## 2019-10-29 ENCOUNTER — Other Ambulatory Visit: Payer: Self-pay | Admitting: Pharmacist

## 2019-10-29 DIAGNOSIS — R7303 Prediabetes: Secondary | ICD-10-CM

## 2019-10-29 DIAGNOSIS — I10 Essential (primary) hypertension: Secondary | ICD-10-CM

## 2019-10-29 DIAGNOSIS — E119 Type 2 diabetes mellitus without complications: Secondary | ICD-10-CM

## 2019-10-29 MED ORDER — ONETOUCH VERIO VI STRP: ORAL_STRIP | 12 refills | Status: DC

## 2019-10-29 MED ORDER — ONETOUCH DELICA LANCETS 33G MISC: 11 refills | Status: DC

## 2019-10-29 MED ORDER — ONETOUCH DELICA PLUS LANCING MISC: 1.0000 | Freq: Two times a day (BID) | 11 refills | Status: DC

## 2019-10-29 MED ORDER — ONETOUCH VERIO W/DEVICE KIT: PACK | 0 refills | Status: DC

## 2019-10-29 NOTE — Progress Notes (Signed)
Patient ID: Renee Murray, female   DOB: [birth year], 63 y.o.   MRN: [id number]   Message received that patient needs new prescription sent to her pharmacy for glucometer and testing supplies with more specific testing instructions. New RX's sent to [facility]

## 2019-10-29 NOTE — Telephone Encounter (Signed)
New prescription sent to patient's pharmacy at [facility] for diabetic testing supplies to use twice daily.  Please call pharmacy to make sure that they receive the prescription and that patient can pick up the prescription on 10/30/2019.  Notify patient after contacting pharmacy.  Please notify me if pharmacy has any other difficulty with filling the prescription

## 2019-10-30 ENCOUNTER — Telehealth: Payer: Self-pay | Admitting: Family Medicine

## 2019-10-30 ENCOUNTER — Other Ambulatory Visit: Payer: Self-pay | Admitting: Pharmacist

## 2019-10-30 DIAGNOSIS — R7303 Prediabetes: Secondary | ICD-10-CM

## 2019-10-30 DIAGNOSIS — I10 Essential (primary) hypertension: Secondary | ICD-10-CM

## 2019-10-30 DIAGNOSIS — E119 Type 2 diabetes mellitus without complications: Secondary | ICD-10-CM

## 2019-10-30 MED ORDER — ONETOUCH VERIO W/DEVICE KIT: PACK | 0 refills | Status: DC

## 2019-10-30 MED ORDER — ONETOUCH VERIO VI STRP: ORAL_STRIP | 12 refills | Status: DC

## 2019-10-30 MED ORDER — ONETOUCH DELICA LANCETS 33G MISC: 11 refills | Status: DC

## 2019-10-30 MED ORDER — ONETOUCH DELICA PLUS LANCING MISC: 1.0000 | Freq: Two times a day (BID) | 11 refills | Status: DC

## 2019-10-30 NOTE — Telephone Encounter (Signed)
Dr. Chapman Fitch -   I spoke with the pharmacist at [facility]. They are still trying to use the prediabetes dx code from her A1c earlier this year. I let the pharmacy know her latest A1c is consistent with DM and that you provided an updated rx yesterday.

## 2019-10-30 NOTE — Telephone Encounter (Signed)
Patient has been directed to the pharmacy on [address].

## 2019-10-30 NOTE — Telephone Encounter (Signed)
Can you let her know that she can pick up her Rx

## 2019-10-30 NOTE — Telephone Encounter (Signed)
Patient called stating she went last night to pick up her meter and she was informed by [place] that the Rx is wrong. Patient states she was told by the pharmacy for [place] to call the pharmacy so they can tell us what the Rx needs to say. Please f/u

## 2019-10-31 ENCOUNTER — Other Ambulatory Visit: Payer: Self-pay

## 2019-10-31 DIAGNOSIS — R7303 Prediabetes: Secondary | ICD-10-CM

## 2019-10-31 MED ORDER — ONETOUCH DELICA LANCETS 33G MISC: 11 refills | Status: DC

## 2019-11-10 ENCOUNTER — Ambulatory Visit: Payer: Medicare Other | Attending: Family Medicine | Admitting: Pharmacist

## 2019-11-10 ENCOUNTER — Other Ambulatory Visit: Payer: Self-pay

## 2019-11-10 ENCOUNTER — Encounter: Payer: Self-pay | Admitting: Pharmacist

## 2019-11-10 VITALS — BP 132/77 | HR 80

## 2019-11-10 DIAGNOSIS — I10 Essential (primary) hypertension: Secondary | ICD-10-CM | POA: Insufficient documentation

## 2019-11-10 DIAGNOSIS — Z79899 Other long term (current) drug therapy: Secondary | ICD-10-CM | POA: Diagnosis not present

## 2019-11-10 DIAGNOSIS — E119 Type 2 diabetes mellitus without complications: Secondary | ICD-10-CM | POA: Diagnosis present

## 2019-11-10 DIAGNOSIS — F17210 Nicotine dependence, cigarettes, uncomplicated: Secondary | ICD-10-CM | POA: Diagnosis not present

## 2019-11-10 DIAGNOSIS — E785 Hyperlipidemia, unspecified: Secondary | ICD-10-CM | POA: Diagnosis not present

## 2019-11-10 NOTE — Progress Notes (Signed)
S:    PCP: Dr. Jillyn Hidden  No chief complaint on file.  Patient arrives in good spirits.  Presents for diabetes evaluation, education, and management Patient was referred and last seen by Primary Care Provider on 10/16/19.    Patient reports Diabetes was diagnosed this month.   Family/Social History:  - DM, HF (mother) - Current 0.25 PPD smoker - Denies alcohol use   Insurance coverage/medication affordability: Medicare A & B  Patient reports adherence with medications but has not taken today. She also continues to take 5 mg daily of amlodipine until 10mg .  Current diabetes medications include:  None  Current hypertension medications include: amlodipine 10 mg daily (still takes 5 mg daily) Current hyperlipidemia medications include:  None   Patient denies hypoglycemic events.  Patient reported dietary habits:  - Pt reports eliminating sugar from her diet - She has also reduced her carbs and switched from "white" to "brown" or whole wheat products   Patient-reported exercise habits:  - walks and gardens daily    Patient denies nocturia.  Patient denies neuropathy.  Patient denies visual changes. Patient reports self foot exams.    O:   Vitals:   11/10/19 1639  BP: 132/77  Pulse: 80    Lab Results  Component Value Date   HGBA1C 7.9 (H) 10/21/2019   Vitals:   11/10/19 1639  BP: 132/77  Pulse: 80    Lipid Panel     Component Value Date/Time   CHOL 160 10/21/2019 0939   TRIG 103 10/21/2019 0939   HDL 49 10/21/2019 0939   CHOLHDL 3.3 10/21/2019 0939   CHOLHDL 4.3 03/27/2017 1016   VLDL 26 03/27/2017 1016   LDLCALC 92 10/21/2019 0939   Home fasting blood sugars: 80 - 110s  2 hour post-meal/random blood sugars: 110 - 120s.  Clinical Atherosclerotic Cardiovascular Disease (ASCVD): No The 10-year ASCVD risk score 13/02/2019 DC Jr., et al., 2013) is: 33.8%   Values used to calculate the score:     Age: 63 years     Sex: Female     Is Non-Hispanic African  American: Yes     Diabetic: Yes     Tobacco smoker: Yes     Systolic Blood Pressure: 132 mmHg     Is BP treated: Yes     HDL Cholesterol: 49 mg/dL     Total Cholesterol: 160 mg/dL   A/P: Diabetes newly dx currently uncontrolled based on A1c but home sugars are at goal with dietary modification alone. Patient is able to verbalize appropriate hypoglycemia management plan. We talked extensively concerning metformin and its role in DM control. Pt does not wish to initiate at this time.   -Extensively discussed pathophysiology of diabetes, recommended lifestyle interventions, dietary effects on blood sugar control -Counseled on s/sx of and management of hypoglycemia -Next A1C anticipated 01/2020.   ASCVD risk - primary prevention in patient with diabetes. Last LDL is not <70. ASCVD risk score is >20%  - high intensity statin indicated. We talked extensively concerning hyperlipidemia and DM. We also discussed the ASCVD risk associated with DM and the role of statins to reduce this risk. Pt wants to think about this for now.   Hypertension longstanding currently close to goal.  Blood pressure goal = <130/80 mmHg. Patient is still only taking amlodipine 5 mg daily.  She reports home Bps in the 130s/80s. Additionally, she reports that her BP at last PCP visit was elevated because she did not take her medication before  the appointment.  - Will recheck in 1 month.  - Advised to continue amlodipine 5 mg daily for now.   HM: Due for influenza and PNA vaccines. Recently deferred PNA. Will address influenza at follow-up appointment.   Written patient instructions provided. Total time in face to face counseling 15 minutes.   Follow up Pharmacist Clinic Visit in 1 month.     Benard Halsted, PharmD, [place] [phone]

## 2019-12-10 ENCOUNTER — Ambulatory Visit: Payer: Medicare Other | Admitting: Pharmacist

## 2019-12-22 ENCOUNTER — Ambulatory Visit: Payer: Medicare Other | Admitting: Pharmacist

## 2019-12-29 ENCOUNTER — Ambulatory Visit: Payer: Medicare Other | Attending: Family Medicine | Admitting: Pharmacist

## 2019-12-29 ENCOUNTER — Other Ambulatory Visit: Payer: Self-pay

## 2019-12-29 ENCOUNTER — Encounter: Payer: Self-pay | Admitting: Pharmacist

## 2019-12-29 DIAGNOSIS — I10 Essential (primary) hypertension: Secondary | ICD-10-CM | POA: Insufficient documentation

## 2019-12-29 DIAGNOSIS — F17210 Nicotine dependence, cigarettes, uncomplicated: Secondary | ICD-10-CM | POA: Insufficient documentation

## 2019-12-29 DIAGNOSIS — I2510 Atherosclerotic heart disease of native coronary artery without angina pectoris: Secondary | ICD-10-CM | POA: Diagnosis not present

## 2019-12-29 DIAGNOSIS — E119 Type 2 diabetes mellitus without complications: Secondary | ICD-10-CM

## 2019-12-29 DIAGNOSIS — E1165 Type 2 diabetes mellitus with hyperglycemia: Secondary | ICD-10-CM | POA: Insufficient documentation

## 2019-12-29 LAB — GLUCOSE, POCT (MANUAL RESULT ENTRY): POC Glucose: 75 mg/dL (ref 70–99)

## 2019-12-29 NOTE — Progress Notes (Signed)
    S:    PCP: Dr. Jillyn Hidden  No chief complaint on file.  Patient arrives in good spirits.  Presents for diabetes evaluation, education, and management Patient was referred and last seen by Primary Care Provider on 10/16/19. I saw her on 11/10/19. Patient is reluctant to add medications. Her current level of glycemic control at home is due to lifestyle management.    Patient reports Diabetes was diagnosed in November.   Family/Social History:  - DM, HF (mother) - Current 0.25 PPD smoker - Denies alcohol use   Insurance coverage/medication affordability: Medicare A & B  Patient reports adherence with medications.  Current diabetes medications include:  None  Current hypertension medications include: amlodipine 10 mg daily Current hyperlipidemia medications include:  None   Patient denies hypoglycemic events.  Patient reported dietary habits:  - Pt reports eliminating sugar from her diet - She has also reduced her carbs and switched from "white" to "brown" or whole wheat products   Patient-reported exercise habits:  - walks and gardens daily when the weather allows   Patient denies nocturia.  Patient denies neuropathy.  Patient denies visual changes. Patient reports self foot exams.    O:   There were no vitals filed for this visit.  Lab Results  Component Value Date   HGBA1C 7.9 (H) 10/21/2019   There were no vitals filed for this visit.  Lipid Panel     Component Value Date/Time   CHOL 160 10/21/2019 0939   TRIG 103 10/21/2019 0939   HDL 49 10/21/2019 0939   CHOLHDL 3.3 10/21/2019 0939   CHOLHDL 4.3 03/27/2017 1016   VLDL 26 03/27/2017 1016   LDLCALC 92 10/21/2019 0939   Home fasting blood sugars: 96-134 2 hour post-meal/random blood sugars: 165 - 193.  Clinical Atherosclerotic Cardiovascular Disease (ASCVD): No The 10-year ASCVD risk score Denman George DC Jr., et al., 2013) is: 33.8%   Values used to calculate the score:     Age: 64 years     Sex: Female     Is  Non-Hispanic African American: Yes     Diabetic: Yes     Tobacco smoker: Yes     Systolic Blood Pressure: 132 mmHg     Is BP treated: Yes     HDL Cholesterol: 49 mg/dL     Total Cholesterol: 160 mg/dL   A/P: Diabetes dx's in 10/2019 currently uncontrolled based on A1c but home sugars are at goal with dietary modification alone. Patient is able to verbalize appropriate hypoglycemia management plan. We talked extensively concerning metformin and its role in DM control. Pt does not wish to initiate at this time.   -Extensively discussed pathophysiology of diabetes, recommended lifestyle interventions, dietary effects on blood sugar control -Counseled on s/sx of and management of hypoglycemia -Next A1C anticipated 01/2020.   ASCVD risk - primary prevention in patient with diabetes. Last LDL is not <70. ASCVD risk score is >20%  - high intensity statin indicated. We talked extensively concerning hyperlipidemia and DM. We also discussed the ASCVD risk associated with DM and the role of statins to reduce this risk. Pt denies statin use.  HM: deferred influenza, PNA vaccines.   Written patient instructions provided. Total time in face to face counseling 15 minutes.   Follow up w/ PCP next month.   Butch Penny, PharmD, CPP Clinical Pharmacist [facility] [phone]

## 2020-01-23 ENCOUNTER — Ambulatory Visit: Payer: Medicare Other | Admitting: Family Medicine

## 2020-02-03 ENCOUNTER — Ambulatory Visit: Payer: Self-pay

## 2020-02-03 ENCOUNTER — Other Ambulatory Visit: Payer: Self-pay

## 2020-02-03 NOTE — Patient Outreach (Signed)
[facility] Evaluation Interviewer made contact with patient. Aging Gracefully survey completed.   Interviewer will send referral to Ricke Hey, RN and OT for follow up.   [facility] Management Assistant

## 2020-02-12 NOTE — Telephone Encounter (Signed)
error 

## 2020-03-02 ENCOUNTER — Other Ambulatory Visit: Payer: Self-pay | Admitting: Occupational Therapy

## 2020-03-02 ENCOUNTER — Other Ambulatory Visit: Payer: Self-pay

## 2020-03-02 NOTE — Patient Outreach (Signed)
Aging Gracefully Program  OT Initial Visit  03/02/2020  Renee Murray 1956-06-11 [id number]  Visit:  1- Initial Visit  Start Time:  1400 End Time:  1540 Total Minutes:  100  CCAP: Typical Daily Routine: Typical Daily Routine:: lives alone, rises around 530 am does a bible study makes breakfast, completes housework, naps in afternoon.  not going out as much as she used to. What Types Of Care Problems Are You Having Throughout The Day?: bathing safety, some difficulty with fasteners and tying shoes  What Kind Of Help Do You Receive?: food bank for food Do You Think You Need Other Types Of Help?: no not really - maybe some home repairs  What Do You Think Would Make Everyday Life Easier For You?: grabbars a new shower seat a new railing outside  What Is A Good Day Like?: no pain  What Is A Bad Day Like?: pain from arthritis and sciatica Do You Have Time For Yourself?: yes  Patient Reported Equipment: Patient Reported Equipment Currently Used: Long Handle Sponge, Agricultural consultant, Network engineer:: hand held shower in Estate agent, comfort level commodes  Functional Mobility-Walking Indoors/Getting Around the [employer]:   Functional Mobility-Walk A Block: Walk A Block: Unable To Do Do You:: N/A- Not Applicable Functional Mobility-Maintain Balance While Showering: Maintaining Balance While Showering: Moderate Difficulty Do You:: Use A Device Importance Of Learning New Strategies:: Very Much Observation: Maintain Balance While Showering: Independent With Pain, Difficulty, Or Use Of Device Safety: Moderate/Extreme Risk Efficiency: Not At All Intervention: Yes Functional Mobility-Stooping, Crouching, Kneeling To Retreive Item: Stooping, Crouching, or Kneeling To Retrieve Item: A Little Difficulty Do You:: No Device/No Assistance Importance Of Learning New Strategies:: Not At All Functional Mobility-Bending From Standing Position To Pick Up Clothing Off The Floor: Bending  Over From Standing Position To Pick Up Clothing Off The Floor: No Difficulty Do You:: No Device/No Assistance Importance Of Learning New Strategies:: Not At All Functional Mobility-Reaching For Items Above Shoulder Level: Reaching For Items Above Shoulder Level: No Difficulty Do You:: Use A Device Other Comments:: uses a step stool and a stool Functional Mobility-Climb 1 Flight Of Stairs: Climb 1 Flight Of Stairs: N/A Functional Mobility-Move In And Out Of Chair: Move In and Out Of A Chair: No Difficulty Do You:: No Device/No Assistance Importance Of Learning New Strategies:: Not At All Functional Mobility-Move In And Out Of Bed: Move In and Out Of Bed: Moderate Difficulty Do You:: Use A Device Importance Of Learning New Strategies:: Very Much Other Comments:: uses chair against wall to launch herself  Observation: Move In and Out Of Bed: Independent With Pain, Difficulty, Or Use Of Device Safety: Moderate/Extreme Risk Efficiency: Not At All Intervention: Yes Functional Mobility-Move In And Out Of Bath/Shower: Move In And Out Of A Bath/Shower: Moderate Difficulty Do You:: Use A Device Importance Of Learning New Strategies:: Very Much Other Comments:: needs grabbars and a ttb- tries to reach to washcloth bar to grab for transfer  Observation: Move In And Out Of Bath/Shower: Independent With Pain, Difficulty, Or Use Of Device Safety: Moderate/Extreme Risk Efficiency: Somewhat Intervention: Yes Functional Mobility-Get On And Off Toilet: Getting Up From The Floor: A Little Difficulty Do You:: No Device/No Assistance Importance Of Learning New Strategies:: A Little Functional Mobility-Into And Out Of Car, Not Including Driving: Into  And Out Of Car, Not Including Driving: No Difficulty Do You:: No Device/No Assistance Importance Of Learning New Strategies:: Not At All Functional Mobility-Other Mobility Difficulty:  Activities of Daily  Living-Bathing/Showering: ADL-Bathing/Showering: A Little Difficulty Do You:: Use A Device Importance Of Learning New Strategies: Not At All Other Comments:: uses a long handled sponge Activities of Daily Living-Personal Hygiene and Grooming: Personal Hygiene and Grooming: No Difficulty Activities of Daily Living-Toilet Hygiene: Toilet Hygiene: No Difficulty Activities of Daily Living-Put On And Take Off Undergarments (Incl. Fasteners): Put On And Take Off Undergarments (Incl. Fasteners): No Difficulty Activities of Daily Living-Put On And Take Off Shirt/Dress/Coat (Incl. Fasteners): Put On And Take Off Shirt/Dress/Coat (Incl. Fasteners): A Little Difficulty Do You:: No Device/No Assistance Importance Of Learning New Strategies: Not At All Other Comments:: arthritis makes fasteners difficult, not interested in learning new strategies  Activities of Daily Living-Put On And Take Off Socks And Shoes: Put On And Take Off Socks And  Shoes: A Little Difficulty Do You:: No Device/No Assistance Importance Of Learning New Strategies: Not At All Activities of Daily Living-Feed Self: Feed Self: No Difficulty Activities of Daily Living-Rest And Sleep: Rest and Sleep: A Little Difficulty Do You:: No Device/No Assistance Importance Of Learning New Strategies: Not At All Activities of Daily Living-Sexual Activity: Sexual  Activity: No Difficulty Activities of Daily Living-Other Activity Identified:    Instrumental Activities of Daily Living-Light Homemaking (Laundry, Straightening Up, Vacuuming):  Do Light Homemaking (Laundry, Straightening Up, Vacuuming): No Difficulty Instrumental Activities of Daily Living-Making A Bed: Making a Bed: No Difficulty Instrumental Activities of Daily Living-Washing Dishes By Hand While Standing At The Sink: Washing Dishes By Hand While Standing At The Sink: No Difficulty Instrumental Activities of Daily Living-Grocery Shopping: Do Grocery Shopping: No  Difficulty Other Comments:: foodbank drive up service Instrumental Activities of Daily Living-Use Telephone: Use Telephone: No Difficulty Instrumental Activities of Daily Living-Financial Management: Financial Management: No Difficulty Instrumental Activities of Daily Living-Medications: Take Medications: No Difficulty Instrumental Activities of Daily Living-Health Management And Maintenance: Health Management & Maintenance: No Difficulty Instrumental Activities of Daily Living-Meal Preparation and Clean-Up: Meal Preparation and Clean-Up: No Difficulty Instrumental Activities of Daily Living-Provide Care For Others/Pets: Care For Others/Pets: N/A Instrumental Activities of Daily Living-Take Part In Organized Social Activities: Take Part In Organized Social Activities: No Difficulty Instrumental Activities of Daily Living-Leisure Participation: Leisure Participation: No Difficulty Instrumental Activities of Daily Living-Employment/Volunteer Activities: Employment/Volunteer Activities: No Difficulty Instrumental Activities of Daily Living-Other Identifies:    Readiness To Change Score:  Readiness to Change Score: 6.67  Home Environment Assessment: Outside Home Entry:: looking at house, left banister is loose and needs replaced Entryway/Foyer:: throw rugs Dining Room:: n/a Living Room:: throw rugs and chair in walkway Kitchen:: throw rugs has stool and step ladder Stairs:: n/a Bathroom:: both bathrooms need grabbars at entrance of shower, back wall of shower.  need hand held shower in hall bath, could use tub bench/shower seat Master Bedroom:: throw rugs, bed is extremely tall and unsafe for entry exit Laundry:: n/a Basement:: n/a Hallways:: scatter rugs dimly lit and cluttered Smoke/CO2 Detector:: co2 detector needs installed Other Home Environment Concerns:: lots of clutter and knicknacks which could pose a fall risk  Durable Medical Equipment:    Patient  Education: Education Provided: Yes Education Details: reviewed goals and issued check for Programme researcher, broadcasting/film/video) Educated: Patient Comprehension: Verbalized Understanding  Goals: Goals Addressed            This Visit's Progress   . Patient Stated       Improve safety and independence getting in and out shower and maintaining balance while showering.      . Patient  Stated       Increase safety and independence getting in and out of bed.    . Patient Stated       Increase safety entering and exiting home.       Post Clinical Reasoning: Clinician View Of Client Situation:: patient lives independently, has had rehab services in the past, knows safety precautions to implement, however has not done so and maybe doesnt plan to do so.  not as receptive to OT recommendations Client View Of His/Her Situation:: knows what she wants to do and talks around suggestions from others, not as interested in taking further precautions. Next Visit Plan:: brainstorm goal area around bathroom safety  Shirlean Mylar, MHA, OTR/L [phone]

## 2020-03-04 ENCOUNTER — Other Ambulatory Visit: Payer: Self-pay

## 2020-03-04 ENCOUNTER — Ambulatory Visit: Payer: Medicare Other | Attending: Family Medicine | Admitting: Physician Assistant

## 2020-03-04 DIAGNOSIS — Z8679 Personal history of other diseases of the circulatory system: Secondary | ICD-10-CM

## 2020-03-04 DIAGNOSIS — F418 Other specified anxiety disorders: Secondary | ICD-10-CM

## 2020-03-04 DIAGNOSIS — I10 Essential (primary) hypertension: Secondary | ICD-10-CM

## 2020-03-04 DIAGNOSIS — E119 Type 2 diabetes mellitus without complications: Secondary | ICD-10-CM

## 2020-03-04 DIAGNOSIS — J309 Allergic rhinitis, unspecified: Secondary | ICD-10-CM

## 2020-03-04 DIAGNOSIS — Z720 Tobacco use: Secondary | ICD-10-CM

## 2020-03-04 MED ORDER — CETIRIZINE HCL 10 MG PO TABS: 10.0000 mg | ORAL_TABLET | Freq: Every day | ORAL | 1 refills | Status: DC

## 2020-03-04 MED ORDER — ONETOUCH VERIO VI STRP: ORAL_STRIP | 12 refills | Status: DC

## 2020-03-04 MED ORDER — ONETOUCH DELICA LANCETS 33G MISC: 11 refills | Status: DC

## 2020-03-04 MED ORDER — GABAPENTIN 300 MG PO CAPS: 300.0000 mg | ORAL_CAPSULE | Freq: Three times a day (TID) | ORAL | 1 refills | Status: DC

## 2020-03-04 MED ORDER — AMLODIPINE BESYLATE 10 MG PO TABS: 10.0000 mg | ORAL_TABLET | Freq: Every day | ORAL | 1 refills | Status: DC

## 2020-03-04 MED ORDER — FLUTICASONE PROPIONATE 50 MCG/ACT NA SUSP: 2.0000 | Freq: Every day | NASAL | 5 refills | Status: DC | PRN

## 2020-03-04 NOTE — Progress Notes (Signed)
Patient verified DOB Patient has taken medication today. Patient has eaten today. BP this morning was 152/76 prior to medication. CBG 111 before eating

## 2020-03-04 NOTE — Patient Instructions (Addendum)
Continue monitoring your blood pressure, continue to keep a log Continue monitoring your blood sugars, continue following your low sugar diet. As we discussed, I am including some information on Mediterranean style eating plan, this is a very heart healthy style of eating.  Move your dosing of BuSpar to bedtime, hopefully this will help with your side effect of drowsiness.  If you do not feel that it is effective, you can try taking 10 mg at bedtime.  It is important to take it on a daily basis for it to be most effective.  Also as we discussed, you do have a very high risk of a cardiovascular event in the next 10 years, working to eliminate cigarette smoking would be a very important step to helping reduce this risk.  I have included some information on steps to quit smoking, if you need any help from [place] please feel free to reach out  Please come in for fasting labs and a 34-month follow-up with Dr. Jillyn Hidden.   Mediterranean Diet A Mediterranean diet refers to food and lifestyle choices that are based on the traditions of countries located on the [employer] [place]. This way of eating has been shown to help prevent certain conditions and improve outcomes for people who have chronic diseases, like kidney disease and heart disease. What are tips for following this plan? Lifestyle  Cook and eat meals together with your family, when possible.  Drink enough fluid to keep your urine clear or pale yellow.  Be physically active every day. This includes: ? Aerobic exercise like running or swimming. ? Leisure activities like gardening, walking, or housework.  Get 7-8 hours of sleep each night.  If recommended by your health care provider, drink red wine in moderation. This means 1 glass a day for nonpregnant women and 2 glasses a day for men. A glass of wine equals 5 oz (150 mL). Reading food labels   Check the serving size of packaged foods. For foods such as rice and pasta, the serving size refers  to the amount of cooked product, not dry.  Check the total fat in packaged foods. Avoid foods that have saturated fat or trans fats.  Check the ingredients list for added sugars, such as corn syrup. Shopping  At the grocery store, buy most of your food from the areas near the walls of the store. This includes: ? Fresh fruits and vegetables (produce). ? Grains, beans, nuts, and seeds. Some of these may be available in unpackaged forms or large amounts (in bulk). ? Fresh seafood. ? Poultry and eggs. ? Low-fat dairy products.  Buy whole ingredients instead of prepackaged foods.  Buy fresh fruits and vegetables in-season from local farmers markets.  Buy frozen fruits and vegetables in resealable bags.  If you do not have access to quality fresh seafood, buy precooked frozen shrimp or canned fish, such as tuna, salmon, or sardines.  Buy small amounts of raw or cooked vegetables, salads, or olives from the deli or salad bar at your store.  Stock your pantry so you always have certain foods on hand, such as olive oil, canned tuna, canned tomatoes, rice, pasta, and beans. Cooking  Cook foods with extra-virgin olive oil instead of using butter or other vegetable oils.  Have meat as a side dish, and have vegetables or grains as your main dish. This means having meat in small portions or adding small amounts of meat to foods like pasta or stew.  Use beans or vegetables instead of meat  in common dishes like chili or lasagna.  Experiment with different cooking methods. Try roasting or broiling vegetables instead of steaming or sauteing them.  Add frozen vegetables to soups, stews, pasta, or rice.  Add nuts or seeds for added healthy fat at each meal. You can add these to yogurt, salads, or vegetable dishes.  Marinate fish or vegetables using olive oil, lemon juice, garlic, and fresh herbs. Meal planning   Plan to eat 1 vegetarian meal one day each week. Try to work up to 2 vegetarian  meals, if possible.  Eat seafood 2 or more times a week.  Have healthy snacks readily available, such as: ? Vegetable sticks with hummus. ? [place] yogurt. ? Fruit and nut trail mix.  Eat balanced meals throughout the week. This includes: ? Fruit: 2-3 servings a day ? Vegetables: 4-5 servings a day ? Low-fat dairy: 2 servings a day ? Fish, poultry, or lean meat: 1 serving a day ? Beans and legumes: 2 or more servings a week ? Nuts and seeds: 1-2 servings a day ? Whole grains: 6-8 servings a day ? Extra-virgin olive oil: 3-4 servings a day  Limit red meat and sweets to only a few servings a month What are my food choices?  Mediterranean diet ? Recommended  Grains: Whole-grain pasta. Brown rice. Bulgar wheat. Polenta. Couscous. Whole-wheat bread. Orpah Cobb.  Vegetables: Artichokes. Beets. Broccoli. Cabbage. Carrots. Eggplant. Green beans. Chard. Kale. Spinach. Onions. Leeks. Peas. Squash. Tomatoes. Peppers. Radishes.  Fruits: Apples. Apricots. Avocado. Berries. Bananas. Cherries. Dates. Figs. Grapes. Lemons. Melon. Oranges. Peaches. Plums. Pomegranate.  Meats and other protein foods: Beans. Almonds. Sunflower seeds. Pine nuts. Peanuts. Cod. Salmon. Scallops. Shrimp. Tuna. Tilapia. Clams. Oysters. Eggs.  Dairy: Low-fat milk. Cheese. Greek yogurt.  Beverages: Water. Red wine. Herbal tea.  Fats and oils: Extra virgin olive oil. Avocado oil. Grape seed oil.  Sweets and desserts: [place] yogurt with honey. Baked apples. Poached pears. Trail mix.  Seasoning and other foods: Basil. Cilantro. Coriander. Cumin. Mint. Parsley. Sage. Rosemary. Tarragon. Garlic. Oregano. Thyme. Pepper. Balsalmic vinegar. Tahini. Hummus. Tomato sauce. Olives. Mushrooms. ? Limit these  Grains: Prepackaged pasta or rice dishes. Prepackaged cereal with added sugar.  Vegetables: Deep fried potatoes (french fries).  Fruits: Fruit canned in syrup.  Meats and other protein foods: Beef. Pork. Lamb.  Poultry with skin. Hot dogs. Tomasa Blase.  Dairy: Ice cream. Sour cream. Whole milk.  Beverages: Juice. Sugar-sweetened soft drinks. Beer. Liquor and spirits.  Fats and oils: Butter. Canola oil. Vegetable oil. Beef fat (tallow). Lard.  Sweets and desserts: Cookies. Cakes. Pies. Candy.  Seasoning and other foods: Mayonnaise. Premade sauces and marinades. The items listed may not be a complete list. Talk with your dietitian about what dietary choices are right for you. Summary  The Mediterranean diet includes both food and lifestyle choices.  Eat a variety of fresh fruits and vegetables, beans, nuts, seeds, and whole grains.  Limit the amount of red meat and sweets that you eat.  Talk with your health care provider about whether it is safe for you to drink red wine in moderation. This means 1 glass a day for nonpregnant women and 2 glasses a day for men. A glass of wine equals 5 oz (150 mL). This information is not intended to replace advice given to you by your health care provider. Make sure you discuss any questions you have with your health care provider. Document Revised: 08/03/2016 Document Reviewed: 07/27/2016 Elsevier Patient Education  2020 [employer].  Steps to Quit Smoking Smoking tobacco is the leading cause of preventable death. It can affect almost every organ in the body. Smoking puts you and those around you at risk for developing many serious chronic diseases. Quitting smoking can be difficult, but it is one of the best things that you can do for your health. It is never too late to quit. How do I get ready to quit? When you decide to quit smoking, create a plan to help you succeed. Before you quit:  Pick a date to quit. Set a date within the next 2 weeks to give you time to prepare.  Write down the reasons why you are quitting. Keep this list in places where you will see it often.  Tell your family, friends, and co-workers that you are quitting. Support from your  loved ones can make quitting easier.  Talk with your health care provider about your options for quitting smoking.  Find out what treatment options are covered by your health insurance.  Identify people, places, things, and activities that make you want to smoke (triggers). Avoid them. What first steps can I take to quit smoking?  Throw away all cigarettes at home, at work, and in your car.  Throw away smoking accessories, such as Set designer.  Clean your car. Make sure to empty the ashtray.  Clean your home, including curtains and carpets. What strategies can I use to quit smoking? Talk with your health care provider about combining strategies, such as taking medicines while you are also receiving in-person counseling. Using these two strategies together makes you more likely to succeed in quitting than if you used either strategy on its own.  If you are pregnant or breastfeeding, talk with your health care provider about finding counseling or other support strategies to quit smoking. Do not take medicine to help you quit smoking unless your health care provider tells you to do so. To quit smoking: Quit right away  Quit smoking completely, instead of gradually reducing how much you smoke over a period of time. Research shows that stopping smoking right away is more successful than gradually quitting.  Attend in-person counseling to help you build problem-solving skills. You are more likely to succeed in quitting if you attend counseling sessions regularly. Even short sessions of 10 minutes can be effective. Take medicine You may take medicines to help you quit smoking. Some medicines require a prescription and some you can purchase over-the-counter. Medicines may have nicotine in them to replace the nicotine in cigarettes. Medicines may:  Help to stop cravings.  Help to relieve withdrawal symptoms. Your health care provider may recommend:  Nicotine patches, gum, or  lozenges.  Nicotine inhalers or sprays.  Non-nicotine medicine that is taken by mouth. Find resources Find resources and support systems that can help you to quit smoking and remain smoke-free after you quit. These resources are most helpful when you use them often. They include:  Online chats with a Veterinary surgeon.  Telephone quitlines.  Printed Materials engineer.  Support groups or group counseling.  Text messaging programs.  Mobile phone apps or applications. Use apps that can help you stick to your quit plan by providing reminders, tips, and encouragement. There are many free apps for mobile devices as well as websites. Examples include Quit Guide from the [employer] and smokefree.gov What things can I do to make it easier to quit?   Reach out to your family and friends for support and encouragement. Call telephone quitlines (1-800-QUIT-NOW),  reach out to support groups, or work with a Social worker for support.  Ask people who smoke to avoid smoking around you.  Avoid places that trigger you to smoke, such as bars, parties, or smoke-break areas at work.  Spend time with people who do not smoke.  Lessen the stress in your life. Stress can be a smoking trigger for some people. To lessen stress, try: ? Exercising regularly. ? Doing deep-breathing exercises. ? Doing yoga. ? Meditating. ? Performing a body scan. This involves closing your eyes, scanning your body from head to toe, and noticing which parts of your body are particularly tense. Try to relax the muscles in those areas. How will I feel when I quit smoking? Day 1 to 3 weeks Within the first 24 hours of quitting smoking, you may start to feel withdrawal symptoms. These symptoms are usually most noticeable 2-3 days after quitting, but they usually do not last for more than 2-3 weeks. You may experience these symptoms:  Mood swings.  Restlessness, anxiety, or irritability.  Trouble concentrating.  Dizziness.  Strong cravings  for sugary foods and nicotine.  Mild weight gain.  Constipation.  Nausea.  Coughing or a sore throat.  Changes in how the medicines that you take for unrelated issues work in your body.  Depression.  Trouble sleeping (insomnia). Week 3 and afterward After the first 2-3 weeks of quitting, you may start to notice more positive results, such as:  Improved sense of smell and taste.  Decreased coughing and sore throat.  Slower heart rate.  Lower blood pressure.  Clearer skin.  The ability to breathe more easily.  Fewer sick days. Quitting smoking can be very challenging. Do not get discouraged if you are not successful the first time. Some people need to make many attempts to quit before they achieve long-term success. Do your best to stick to your quit plan, and talk with your health care provider if you have any questions or concerns. Summary  Smoking tobacco is the leading cause of preventable death. Quitting smoking is one of the best things that you can do for your health.  When you decide to quit smoking, create a plan to help you succeed.  Quit smoking right away, not slowly over a period of time.  When you start quitting, seek help from your health care provider, family, or friends. This information is not intended to replace advice given to you by your health care provider. Make sure you discuss any questions you have with your health care provider. Document Revised: 08/29/2019 Document Reviewed: 02/22/2019 Elsevier Patient Education  [place].

## 2020-03-04 NOTE — Progress Notes (Signed)
Established Patient Office Visit  Subjective:  Patient ID: Renee Murray, female    DOB: [birth year]  Age: 64 y.o. MRN: [id number]  CC:  Chief Complaint  Patient presents with  . Follow-up  . Hypertension  . Diabetes   Virtual Visit via Telephone Note  I connected with Vira Agar on 03/04/20 at 10:00 AM EDT by telephone and verified that I am speaking with the correct person using two identifiers.  Location: Patient: Home  Provider: [facility]   I discussed the limitations, risks, security and privacy concerns of performing an evaluation and management service by telephone and the availability of in person appointments. I also discussed with the patient that there may be a patient responsible charge related to this service. The patient expressed understanding and agreed to proceed.   History of Present Illness:  Reports that she has been compliant with her increased dose of amlodipine, states that she has been checking her blood pressure on a regular basis, keeping written log, states that she has had a few high readings, approximately 160/74, has had several lower readings approximately 118/60, states that her most frequent readings tend to be approximately 135/70. Reports that she continues to work on a low-sodium diet.  A1c was 7 9 at last office visit, patient is very reluctant to start medication, states that she wants to continue working on lifestyle modifications.  Reports that she has been eating a lower sugar diet.reports her fasting blood glucose this morning was  111;, reports her meter shows her that the last 30 days 15 of her readings were "in the green", only "1 in the red".  Reports her average tends to be 110.  Reports that she has been taking BuSpar to help with her anxiety, states that she has been using it just as needed, states that it does have a side effect of somnolence, states that her daughter is a Catering manager and gave her education  that she needs to use it on a daily basis.  Reports that she will take one half of a tablet, 5 mg.    Observations/Objective: Patient history reviewed, no physical exam was completed   Past Medical History:  Diagnosis Date  . Brain aneurysm   . Hypertension     Past Surgical History:  Procedure Laterality Date  . ABDOMINAL HYSTERECTOMY    . CHOLECYSTECTOMY    . CRANIOTOMY Right 02/04/2015   Procedure: RIGHT CRANIOTOMY FOR ANEURYSM ;  Surgeon: Consuella Lose, MD;  Location: [place] [facility];  Service: Neurosurgery;  Laterality: Right;  . IR GENERIC HISTORICAL  02/04/2015   IR ANGIO INTRA EXTRACRAN SEL COM CAROTID INNOMINATE BILAT MOD SED 02/04/2015 Consuella Lose, MD [facility]  . IR GENERIC HISTORICAL  02/04/2015   IR ANGIO VERTEBRAL SEL SUBCLAVIAN INNOMINATE UNI R MOD SED 02/04/2015 Consuella Lose, MD [facility]  . IR GENERIC HISTORICAL  02/04/2015   IR ANGIO VERTEBRAL SEL VERTEBRAL UNI L MOD SED 02/04/2015 Consuella Lose, MD [facility]    Family History  Problem Relation Age of Onset  . Diabetes Mother   . Heart failure Mother   . Cancer Father     Social History   Socioeconomic History  . Marital status: Divorced    Spouse name: Not on file  . Number of children: Not on file  . Years of education: Not on file  . Highest education level: Not on file  Occupational History  . Not on file  Tobacco Use  .  Smoking status: Current Every Day Smoker    Packs/day: 0.00    Years: 20.00    Pack years: 0.00    Types: Cigarettes  . Smokeless tobacco: Never Used  Substance and Sexual Activity  . Alcohol use: No  . Drug use: No  . Sexual activity: Not Currently  Other Topics Concern  . Not on file  Social History Narrative  . Not on file   Social Determinants of Health   Financial Resource Strain:   . Difficulty of Paying Living Expenses:   Food Insecurity:   . Worried About Charity fundraiser in the Last Year:   . Arboriculturist in the Last Year:     Transportation Needs:   . Film/video editor (Medical):   Marland Kitchen Lack of Transportation (Non-Medical):   Physical Activity:   . Days of Exercise per Week:   . Minutes of Exercise per Session:   Stress:   . Feeling of Stress :   Social Connections:   . Frequency of Communication with Friends and Family:   . Frequency of Social Gatherings with Friends and Family:   . Attends Religious Services:   . Active Member of Clubs or Organizations:   . Attends Archivist Meetings:   Marland Kitchen Marital Status:   Intimate Partner Violence:   . Fear of Current or Ex-Partner:   . Emotionally Abused:   Marland Kitchen Physically Abused:   . Sexually Abused:     Outpatient Medications Prior to Visit  Medication Sig Dispense Refill  . Blood Glucose Monitoring Suppl (ONETOUCH VERIO) w/Device KIT Test blood sugars in the morning before eating and 2 hours after largest meal or bedtime  E11.69 and I10 1 kit 0  . busPIRone (BUSPAR) 10 MG tablet Take 0.5 tablets (5 mg total) by mouth 2 (two) times daily. 30 tablet 1  . ibuprofen (ADVIL,MOTRIN) 600 MG tablet Take 1 tablet (600 mg total) by mouth every 8 (eight) hours as needed for mild pain or moderate pain. 30 tablet 0  . Lancet Devices (ONETOUCH DELICA PLUS LANCING) MISC 1 each by Does not apply route 2 (two) times daily. One box monthly 1 each 11  . methocarbamol (ROBAXIN) 500 MG tablet Take 1 tablet (500 mg total) by mouth 2 (two) times daily as needed for muscle spasms. 20 tablet 0  . naproxen (NAPROSYN) 500 MG tablet Take 1 tablet (500 mg total) by mouth 2 (two) times daily. 30 tablet 0  . trolamine salicylate (ASPERCREME) 10 % cream Apply 1 application topically as needed for muscle pain.    Marland Kitchen amLODipine (NORVASC) 10 MG tablet Take 1 tablet (10 mg total) by mouth daily. To lower blood pressure 90 tablet 1  . cetirizine (ZYRTEC) 10 MG tablet Take 1 tablet (10 mg total) by mouth daily. 30 tablet 1  . fluticasone (FLONASE) 50 MCG/ACT nasal spray Place 2 sprays into  both nostrils daily. (Patient taking differently: Place 2 sprays into both nostrils daily as needed for allergies or rhinitis. ) 16 g 2  . gabapentin (NEURONTIN) 300 MG capsule Take 1 capsule (300 mg total) by mouth 3 (three) times daily. 90 capsule 1  . glucose blood (ONETOUCH VERIO) test strip Check blood sugar twice daily- fasting and 2 hours after largest meal or prior to bedtime 100 each 12  . OneTouch Delica Lancets 54Y MISC Test blood sugars in the morning before eating and 2 hours after largest meal or bedtime  E11.69 and I10 100 each 11  No facility-administered medications prior to visit.    Allergies  Allergen Reactions  . Demerol Other (See Comments)    "almost died"  . Morphine And Related Other (See Comments)    "Almost Died"    ROS Review of Systems  Constitutional: Negative.  Negative for appetite change.  HENT: Negative.   Eyes: Negative.  Negative for visual disturbance.  Respiratory: Negative.   Cardiovascular: Negative.   Gastrointestinal: Negative.   Endocrine: Negative.  Negative for polydipsia and polyuria.  Genitourinary: Negative.   Musculoskeletal: Negative.   Allergic/Immunologic: Positive for environmental allergies.  Neurological: Negative.   Hematological: Negative.   Psychiatric/Behavioral: Negative for sleep disturbance. The patient is nervous/anxious.       Objective:    Physical Exam  There were no vitals taken for this visit. Wt Readings from Last 3 Encounters:  10/16/19 152 lb (68.9 kg)  09/10/19 154 lb (69.9 kg)  01/04/18 159 lb (72.1 kg)     Health Maintenance Due  Topic Date Due  . URINE MICROALBUMIN  Never done  . COLONOSCOPY  Never done  . PAP SMEAR-Modifier  08/25/2011  . DEXA SCAN  Never done  . MAMMOGRAM  02/06/2020    There are no preventive care reminders to display for this patient.  Lab Results  Component Value Date   TSH 0.738 08/06/2013   Lab Results  Component Value Date   WBC 9.1 08/30/2016   HGB 12.8  08/30/2016   HCT 39.1 08/30/2016   MCV 90.1 08/30/2016   PLT 171 08/30/2016   Lab Results  Component Value Date   NA 142 10/21/2019   K 4.5 10/21/2019   CO2 24 10/21/2019   GLUCOSE 84 10/21/2019   BUN 12 10/21/2019   CREATININE 0.58 10/21/2019   BILITOT <0.2 10/21/2019   ALKPHOS 105 10/21/2019   AST 15 10/21/2019   ALT 16 10/21/2019   PROT 6.6 10/21/2019   ALBUMIN 4.4 10/21/2019   CALCIUM 9.1 10/21/2019   ANIONGAP 9 08/30/2016   Lab Results  Component Value Date   CHOL 160 10/21/2019   Lab Results  Component Value Date   HDL 49 10/21/2019   Lab Results  Component Value Date   LDLCALC 92 10/21/2019   Lab Results  Component Value Date   TRIG 103 10/21/2019   Lab Results  Component Value Date   CHOLHDL 3.3 10/21/2019   Lab Results  Component Value Date   HGBA1C 7.9 (H) 10/21/2019      Assessment & Plan:   Problem List Items Addressed This Visit      Cardiovascular and Mediastinum   Essential hypertension - Primary   Relevant Medications   amLODipine (NORVASC) 10 MG tablet   glucose blood (ONETOUCH VERIO) test strip     Other   Tobacco abuse   Anxiety about health    Other Visit Diagnoses    Allergic rhinitis, unspecified seasonality, unspecified trigger       Relevant Medications   cetirizine (ZYRTEC) 10 MG tablet   Type 2 diabetes mellitus without complication, without long-term current use of insulin (HCC)       Relevant Medications   OneTouch Delica Lancets 49I MISC   glucose blood (ONETOUCH VERIO) test strip   History of spontaneous subarachnoid intracranial hemorrhage due to cerebral aneurysm          Meds ordered this encounter  Medications  . OneTouch Delica Lancets 26E MISC    Sig: Test blood sugars in the morning before eating and 2  hours after largest meal or bedtime  E11.69 and I10    Dispense:  100 each    Refill:  11    Order Specific Question:   Supervising Provider    Answer:   Asencion Noble E [[id number]]  . amLODipine  (NORVASC) 10 MG tablet    Sig: Take 1 tablet (10 mg total) by mouth daily. To lower blood pressure    Dispense:  90 tablet    Refill:  1    Dose change    Order Specific Question:   Supervising Provider    Answer:   Asencion Noble E [[id number]]  . cetirizine (ZYRTEC) 10 MG tablet    Sig: Take 1 tablet (10 mg total) by mouth daily.    Dispense:  30 tablet    Refill:  1    Order Specific Question:   Supervising Provider    Answer:   Joya Gaskins, PATRICK E [[id number]]  . fluticasone (FLONASE) 50 MCG/ACT nasal spray    Sig: Place 2 sprays into both nostrils daily as needed for allergies or rhinitis.    Dispense:  1 g    Refill:  5    Order Specific Question:   Supervising Provider    Answer:   Asencion Noble E [[id number]]  . gabapentin (NEURONTIN) 300 MG capsule    Sig: Take 1 capsule (300 mg total) by mouth 3 (three) times daily.    Dispense:  90 capsule    Refill:  1    Order Specific Question:   Supervising Provider    Answer:   WRIGHT, PATRICK E [[id number]]  . glucose blood (ONETOUCH VERIO) test strip    Sig: Check blood sugar twice daily- fasting and 2 hours after largest meal or prior to bedtime    Dispense:  100 each    Refill:  12    ICD-10 E11.69 and I10; call office if any issues with RX's    Order Specific Question:   Supervising Provider    Answer:   Elsie Stain [[id number]]    Assessment and Plan: 1. Essential hypertension Continue daily blood pressure monitoring, keep log, bring log to next office visit   Patient was counseled on her overall ASCVD risk score of 33.8%.  Patient reports that she is an occasional smoker.  Patient is reluctant to start cholesterol medication, would prefer to have her cholesterol checked again and continue working on lifestyle modifications   - amLODipine (NORVASC) 10 MG tablet; Take 1 tablet (10 mg total) by mouth daily. To lower blood pressure  Dispense: 90 tablet; Refill: 1 - Comp. Metabolic Panel (12); Future - Lipid panel; Future  2. Allergic  rhinitis, unspecified seasonality, unspecified trigger Well-controlled, request refill of Zyrtec and Flonase - cetirizine (ZYRTEC) 10 MG tablet; Take 1 tablet (10 mg total) by mouth daily.  Dispense: 30 tablet; Refill: 1  3. Type 2 diabetes mellitus without complication, without long-term current use of insulin (HCC) Continue monitoring daily blood sugars, keep log, bring to next appointment, gave patient education on Mediterranean style diet, patient reluctant to start medication - OneTouch Delica Lancets 30Q MISC; Test blood sugars in the morning before eating and 2 hours after largest meal or bedtime  E11.69 and I10  Dispense: 100 each; Refill: 11 - glucose blood (ONETOUCH VERIO) test strip; Check blood sugar twice daily- fasting and 2 hours after largest meal or prior to bedtime  Dispense: 100 each; Refill: 12 - Comp. Metabolic Panel (12); Future - Lipid panel; Future - Hemoglobin A1c;  Future  4. History of spontaneous subarachnoid intracranial hemorrhage due to cerebral aneurysm Continue with follow-up with neurology  5. Anxiety about health Change BuSpar to bedtime dosing on a daily basis, increase to full tablet, 10 mg if 5 mg is unaffected.  6. Tobacco abuse Encouraged patient to stop smoking given elevated ASCVD risk.  Patient education given in AVS  Follow Up Instructions: Patient encouraged to come into the office tomorrow morning for fasting labs, follow-up with Dr. Chapman Fitch in 3 months   I discussed the assessment and treatment plan with the patient. The patient was provided an opportunity to ask questions and all were answered. The patient agreed with the plan and demonstrated an understanding of the instructions.   The patient was advised to call back or seek an in-person evaluation if the symptoms worsen or if the condition fails to improve as anticipated.  I provided 22 minutes of non-face-to-face time during this encounter.     Follow-up: Return in about 1 day (around  03/05/2020) for Fasting labs.    Loraine Grip Mayers, PA-C

## 2020-03-04 NOTE — Patient Outreach (Addendum)
Care Coordination:  Received referral from [facility].  Placed call to patient to set up time for RN home visit. No answer. Left a message requesting a call back. Provided contact phone number.   PLAN: will  re attempt in 3 days.  [name], RN, BSN, CEN [facility] [employer] [phone]

## 2020-03-04 NOTE — Patient Outreach (Signed)
Aging Gracefully: Patient returned call . Home visit scheduled for 03/09/2020 at 10am.  Provided my contact information. Confirmed address.  [name], RN, BSN, CEN [facility] [employer] [phone]

## 2020-03-05 ENCOUNTER — Other Ambulatory Visit: Payer: Medicare Other

## 2020-03-09 ENCOUNTER — Other Ambulatory Visit: Payer: Self-pay

## 2020-03-09 NOTE — Patient Outreach (Signed)
Aging Gracefully Program  03/09/2020  TAMARI REDWINE [birth year] [id number]   Arrived for scheduled 10 am home visit. No car in driveway. Rang doorbell and knock x4 with no answer. Placed call to patient and left a message. Patient returned call and states that she forgot.  Appointment rescheduled for 03/10/2020 at 11am.   [name], RN, BSN, CEN [facility] Coordinator [phone]

## 2020-03-10 ENCOUNTER — Other Ambulatory Visit: Payer: Self-pay

## 2020-03-10 NOTE — Patient Instructions (Signed)
Outpatient Encounter Medications as of 03/10/2020  Medication Sig Note  . amLODipine (NORVASC) 10 MG tablet Take 1 tablet (10 mg total) by mouth daily. To lower blood pressure   . busPIRone (BUSPAR) 10 MG tablet Take 0.5 tablets (5 mg total) by mouth 2 (two) times daily. 03/10/2020: Patient reports she takes as needed.  . cetirizine (ZYRTEC) 10 MG tablet Take 1 tablet (10 mg total) by mouth daily.   . fluticasone (FLONASE) 50 MCG/ACT nasal spray Place 2 sprays into both nostrils daily as needed for allergies or rhinitis.   Marland Kitchen gabapentin (NEURONTIN) 300 MG capsule Take 1 capsule (300 mg total) by mouth 3 (three) times daily.   Marland Kitchen glucose blood (ONETOUCH VERIO) test strip Check blood sugar twice daily- fasting and 2 hours after largest meal or prior to bedtime   . ibuprofen (ADVIL,MOTRIN) 600 MG tablet Take 1 tablet (600 mg total) by mouth every 8 (eight) hours as needed for mild pain or moderate pain.   Elmore Gu[employer] [facility] DELICA PLUS LANCING) MISC 1 each by Does not apply route 2 (two) times daily. One box monthly   . OneTouch Delica Lancets 24O MISC Test blood sugars in the morning before eating and 2 hours after largest meal or bedtime  E11.69 and I10   . trolamine salicylate (ASPERCREME) 10 % cream Apply 1 application topically as needed for muscle pain.   . Blood Glucose Monitoring Suppl (ONETOUCH VERIO) w/Device KIT Test blood sugars in the morning before eating and 2 hours after largest meal or bedtime  E11.69 and I10   . methocarbamol (ROBAXIN) 500 MG tablet Take 1 tablet (500 mg total) by mouth 2 (two) times daily as needed for muscle spasms. (Patient not taking: Reported on 03/10/2020)   . naproxen (NAPROSYN) 500 MG tablet Take 1 tablet (500 mg total) by mouth 2 (two) times daily. (Patient not taking: Reported on 03/10/2020)    No facility-administered encounter medications on file as of 03/10/2020.   Goals Addressed            This Visit's Progress   . Patient Stated       Improve  safety and independence getting in and out shower and maintaining balance while showering.    03/10/2020  Reviewed patients concern about bathroom safety.  Encouraged patient safety especially with wet floors in bathroom.     . Patient Stated       Increase safety and independence getting in and out of bed.  03/10/2020  Reviewed with patient concern for the height of her bed. Patient states she does not use a step stool.      . Patient Stated       Increase safety entering and exiting home.  03/10/2020  Reviewed goal with patient. Patient is concerned about loose banister at the front entrance of her home.    . Patient Stated       She will exercise daily by walking or working in her yard.   03/10/2020   Reviewed importance of healthy exercise and taking frequent break.

## 2020-03-10 NOTE — Patient Outreach (Signed)
Aging Gracefully Program  RN Visit  03/10/2020  Renee Murray Jul 06, 1956 [id number]  Visit:   RN home visit #1  Start Time:   1100 End Time:   1210  Total Minutes:   70   Readiness To Change Score:     Universal RN Interventions: Calendar Distribution: Yes Medications: Yes Medication Changes: Yes Mood: Yes Pain: Yes Fall Prevention: Yes Incontinence: Yes Clinician View Of Client Situation: Patient independent with ADLS and IADLS. States she push mows her own grass.  Reports lives alone but has good family support.  Home is cluttered but clean. Home smells of smoke. Reports she smoke every now and then. Ambulates well without any assistive devices.  Reports new diagnosis of DM. But not ready for medications. Client View Of His/Her Situation: Patient reports she thinks she is doing well. Reports Coivd has caused to stay home more. Reports she is only around family members due to limiting exposure.  reports she has completed her COVID vaccine series. Patient reviewed assistance needed in and outside the home. Reports heat is fixed now. Reports most needs in the bathroom.  Patient drives herself to appointments and outings.  Healthcare Provider Communication: Did Higher education careers adviser With [employer] Provider?: No  Clinician View of Client Situation: Clinician View Of Client Situation: Patient independent with ADLS and IADLS. States she push mows her own grass.  Reports lives alone but has good family support.  Home is cluttered but clean. Home smells of smoke. Reports she smoke every now and then. Ambulates well without any assistive devices.  Reports new diagnosis of DM. But not ready for medications. Client's View of His/Her Situation: Client View Of His/Her Situation: Patient reports she thinks she is doing well. Reports Coivd has caused to stay home more. Reports she is only around family members due to limiting exposure.  reports she has completed her COVID vaccine series. Patient  reviewed assistance needed in and outside the home. Reports heat is fixed now. Reports most needs in the bathroom.  Patient drives herself to appointments and outings.  Medication Assessment: Do You Have Any Problems Paying For Medications?: No Where Does Client Store Medications?: Cabinet Can Client Read Pill Bottles?: Yes Does Client Use A Pillbox?: Yes Does Anyone Assist Client In Filling Pillbox?: No Does Anyone Assist Client In Taking Medications?: No Do You Take Vitamin D?: No Does Client Have Any Questions Or Concerns About Medictions?: No Is Client Complaining Of Any Symptoms That Could Be Side Effects To Medications?: No Any Possible Changes In Medication Regimen?: No   Outpatient Encounter Medications as of 03/10/2020  Medication Sig Note  . amLODipine (NORVASC) 10 MG tablet Take 1 tablet (10 mg total) by mouth daily. To lower blood pressure   . busPIRone (BUSPAR) 10 MG tablet Take 0.5 tablets (5 mg total) by mouth 2 (two) times daily. 03/10/2020: Patient reports she takes as needed.  . cetirizine (ZYRTEC) 10 MG tablet Take 1 tablet (10 mg total) by mouth daily.   . fluticasone (FLONASE) 50 MCG/ACT nasal spray Place 2 sprays into both nostrils daily as needed for allergies or rhinitis.   Marland Kitchen gabapentin (NEURONTIN) 300 MG capsule Take 1 capsule (300 mg total) by mouth 3 (three) times daily.   Marland Kitchen glucose blood (ONETOUCH VERIO) test strip Check blood sugar twice daily- fasting and 2 hours after largest meal or prior to bedtime   . ibuprofen (ADVIL,MOTRIN) 600 MG tablet Take 1 tablet (600 mg total) by mouth every 8 (eight) hours as needed  for mild pain or moderate pain.   Elmore [employer] [facility] DELICA PLUS LANCING) MISC 1 each by Does not apply route 2 (two) times daily. One box monthly   . OneTouch Delica Lancets 07O MISC Test blood sugars in the morning before eating and 2 hours after largest meal or bedtime  E11.69 and I10   . trolamine salicylate (ASPERCREME) 10 % cream Apply 1  application topically as needed for muscle pain.   . Blood Glucose Monitoring Suppl (ONETOUCH VERIO) w/Device KIT Test blood sugars in the morning before eating and 2 hours after largest meal or bedtime  E11.69 and I10   . methocarbamol (ROBAXIN) 500 MG tablet Take 1 tablet (500 mg total) by mouth 2 (two) times daily as needed for muscle spasms. (Patient not taking: Reported on 03/10/2020)   . naproxen (NAPROSYN) 500 MG tablet Take 1 tablet (500 mg total) by mouth 2 (two) times daily. (Patient not taking: Reported on 03/10/2020)    No facility-administered encounter medications on file as of 03/10/2020.     Session Summary: Reviewed goals with patient and requested modification. Patient is very active but wants to increase her exercise plan.  Will plan to follow up with in home visit on 03/30/2020  Goals Addressed            This Visit's Progress   . Patient Stated       Improve safety and independence getting in and out shower and maintaining balance while showering.    03/10/2020  Reviewed patients concern about bathroom safety.  Encouraged patient safety especially with wet floors in bathroom.     . Patient Stated       Increase safety and independence getting in and out of bed.  03/10/2020  Reviewed with patient concern for the height of her bed. Patient states she does not use a step stool.      . Patient Stated       Increase safety entering and exiting home.  03/10/2020  Reviewed goal with patient. Patient is concerned about loose banister at the front entrance of her home.    . Patient Stated       She will exercise daily by walking or working in her yard.   03/10/2020   Reviewed importance of healthy exercise and taking frequent break.       Tomasa Rand, RN, BSN, CEN [facility] [employer] [phone]

## 2020-03-30 ENCOUNTER — Other Ambulatory Visit: Payer: Self-pay

## 2020-03-30 NOTE — Patient Outreach (Signed)
[facility]) Care Management  03/30/2020  Renee Murray [birth year] [id number]   Aging Gracefully:  Arrived for scheduled home visit at 11am. No answer at door. Called patient and she reports she is out of town and forgot to call me.  PLAN: I requested patient call me when she is ready to reschedule.  [name], RN, BSN, CEN [facility] [employer] [phone]

## 2020-04-05 ENCOUNTER — Encounter: Payer: Self-pay | Admitting: Occupational Therapy

## 2020-04-06 ENCOUNTER — Other Ambulatory Visit: Payer: Self-pay | Admitting: Occupational Therapy

## 2020-04-06 ENCOUNTER — Other Ambulatory Visit: Payer: Self-pay

## 2020-04-06 NOTE — Patient Outreach (Addendum)
Aging Gracefully Program  OT Follow-Up Visit  04/06/2020  Renee Murray 01/29/1956 [id number]  Visit:  2- Second Visit  Start Time:  1000 End Time:  1139 Total Minutes:  99     Readiness to Change Score :  Readiness to Change Score: 6.67  Home Environment Assessment:    Durable Medical Equipment: Durable Medical Equipment: Tub Transfer Bench, Shower Chair Without Back Durable Medical Equipment Distribution Date: 04/06/20  Patient Education: Education Provided: Yes Education Details: educated pt on safe use of shower chair and tub bench. Placed DME in bathrooms, pt physically tried and problem solved with OT Person(s) Educated: Patient Comprehension: Verbalized Understanding, Returned Demonstration  Goals:  Goals Addressed            This Visit's Progress   . COMPLETED: Patient Stated       Improve safety and independence getting in and out shower and maintaining balance while showering.    03/10/2020  Reviewed patients concern about bathroom safety.  Encouraged patient safety especially with wet floors in bathroom.    ACTION PLANNING - BATHING Target Problem Area: Safe use of DME and transfers in and out of shower   Why Problem May Occur: Current shower chair is broken        Target Goal: Pt to feel more confident, independent, safe, and secure with bathing.    STRATEGIES Saving Your Energy: DO: DON'T:  Use a tub bench/seat Stand while bathing, it uses more energy  Use appropriate adaptive equipment:  long handled sponge, soap on a rope Rush  Keep all items you'll need within easy reach   Other   Other    Modifying your home environment and making it safe: DO: DON'T:  Install grab bars n the shower and next to the toilet   Place a rubber mat along the entire length of the tub Place loose rugs in the bathroom- they can trip you or your walker/cane can get caught on them  Make sure the bathroom is well lit    Install a hand held shower head     Simplifying the way you set up tasks or daily routines: DO: DON'T:  Plan to bathe/shower before you're overly tired Rush through [employer] all items before getting started   Other   Other   Other    PRACTICE It is important to practice the strategies so we can determine if they will be effective in helping to reach your goal. Follow these specific recommendations: 1. Use your shower chair or tub bench 2. Make sure you know the floors are not wet when you get out, take your time. 3. Use your long handled loofa while sitting to bathe to save more energy  If a strategy does not work the first time, try it again and again (and maybe again). We may make some changes over the next few sessions, based on how they work.   Dalphine Handing, MSOT, OTR/L Acute Rehabilitation Services [facility] Office Number: [phone] Pager: [phone]        Post Clinical Reasoning: Client Action (Goal) One Interventions: Increasing bathroom safety for showering Did Client Try?: Yes Targeted Problem Area Status: A Lot Better Clinician View Of Client Situation:: Distributed tub bench and shower chair to pt and educated on safe recommendations for placement of this DME in different bathrooms. Pt has chose to place tub bench and shower chair in opposite bathrooms than OT originally had educated on. Helped pt practice and ensure safety despite pt  choices. Client View Of His/Her Situation:: Pt is in undersatnding of education, but prefers to do things in her way Next Visit Plan:: brainstorm goal area for getting in and out of bed safely  Pt was also given hand held shower head this date, with request for [employer] solutions to install.   Zenovia Jarred, MSOT, OTR/L Acute Rehabilitation Services [facility] Office Number: [phone] Pager: [phone]

## 2020-04-06 NOTE — Patient Instructions (Signed)
Goals Addressed            This Visit's Progress   . COMPLETED: Patient Stated         Increase safety of getting in and out of the tub shower.  ACTION PLANNING - BATHING Target Problem Area: Safe use of DME and transfers in and out of shower   Why Problem May Occur: Current shower chair is broken        Target Goal: Pt to feel more confident, independent, safe, and secure with bathing.    STRATEGIES Saving Your Energy: DO: DON'T:  Use a tub bench/seat Stand while bathing, it uses more energy  Use appropriate adaptive equipment:  long handled sponge, soap on a rope Rush  Keep all items you'll need within easy reach   Other   Other    Modifying your home environment and making it safe: DO: DON'T:  Install grab bars n the shower and next to the toilet   Place a rubber mat along the entire length of the tub Place loose rugs in the bathroom- they can trip you or your walker/cane can get caught on them  Make sure the bathroom is well lit    Install a hand held shower head    Simplifying the way you set up tasks or daily routines: DO: DON'T:  Plan to bathe/shower before you're overly tired Rush through [employer] all items before getting started   Other   Other   Other    PRACTICE It is important to practice the strategies so we can determine if they will be effective in helping to reach your goal. Follow these specific recommendations: 1. Use your shower chair or tub bench 2. Make sure you know the floors are not wet when you get out, take your time. 3. Use your long handled loofa while sitting to bathe to save more energy  If a strategy does not work the first time, try it again and again (and maybe again). We may make some changes over the next few sessions, based on how they work.   Dalphine Handing, MSOT, OTR/L Acute Rehabilitation Services [facility] Office Number: [phone] Pager: [phone]

## 2020-04-13 ENCOUNTER — Other Ambulatory Visit: Payer: Self-pay

## 2020-04-13 NOTE — Patient Outreach (Signed)
[facility]) Care Management  04/13/2020  Renee Murray [birth year] [id number]   Aging Gracefully:  Placed call to patient to reschedule missed home visit. No answer. Left a message requesting a call back.  PLAN: will wait for a call back If no response in 3 days will call back again.  [name], RN, BSN, CEN [facility] [employer] [phone]

## 2020-04-16 ENCOUNTER — Other Ambulatory Visit: Payer: Self-pay

## 2020-04-16 NOTE — Patient Outreach (Signed)
Aging Gracefully Program  RN Visit  04/16/2020  Renee Murray 1956/04/25 [id number]  Visit:   Telephonic visit RN #2  Start Time:   1100 End Time:   1130 Total Minutes:   30  Readiness To Change Score:     Universal RN Interventions: Calendar Distribution: Yes Exercise Review: Yes(via phone, packet to be mailed.) Medications: Yes Medication Changes: Yes Pain: Yes Fall Prevention: Yes Incontinence: Yes Clinician View Of Client Situation: Telephone visit.  patient reports she is extremely busy and is not up to book an appointment, states that boht her daughters are moving back to [place] and she is helping.  Reports she id doing well and continues to work in the yard. denies any falls. Client View Of His/Her Situation: Reports she is doing well. Staying active and helping her children. Reports still waiting to hear back from [employer] about her home repairs.  Healthcare Provider Communication: none    Diplomatic Services operational officer of Client Situation: Diplomatic Services operational officer Of Client Situation: Telephone visit.  patient reports she is extremely busy and is not up to book an appointment, states that boht her daughters are moving back to [place] and she is helping.  Reports she id doing well and continues to work in the yard. denies any falls. Client's View of His/Her Situation: Client View Of His/Her Situation: Reports she is doing well. Staying active and helping her children. Reports still waiting to hear back from [employer] about her home repairs.  Medication Assessment:no changes      Session Summary: Patient request no home visit due to her busy schedule at this time.  Reviewed home exercises that need to be started. Will mail packet and will follow up with patient in 1 month. She denies any falls and reports she is doing well.  Goals Addressed            This Visit's Progress   . Patient Stated   On track    She will exercise daily by walking or  working in her yard.   03/10/2020   Reviewed importance of healthy exercise and taking frequent break.  04/16/3020 Reports she continues to be active working in her yard. Reviewed home exercises that I will mail to patient. She has agreed to complete exercises and call me if she does not understanding the exercises needed to complete.        [name], RN, BSN, CEN [facility] [employer] [phone]

## 2020-04-16 NOTE — Patient Outreach (Signed)
Aging Gracefully Program  04/16/2020  Renee Murray 1956-11-20 [id number]   Care Coordination:  Placed call to patient to schedule home visit. No answer. Left a message requesting a call back.  [name], RN, BSN, CEN [facility] [employer] [phone]

## 2020-05-10 ENCOUNTER — Encounter: Payer: Self-pay | Admitting: Occupational Therapy

## 2020-05-10 ENCOUNTER — Other Ambulatory Visit: Payer: Self-pay

## 2020-05-10 NOTE — Patient Outreach (Signed)
Aging Gracefully Program  05/10/2020  AVERIANNA BRUGGER 1956-10-12 [id number]   Care coordination:  Placed call to patient to schedule home visit. No answer, left a message requesting a call back.  Message from Occupational Therapy stating patient rescheduled Occupational Therapy visit toJUne.  PLAN: Will await a call back.  [name], RN, BSN, CEN [facility] [employer] [phone]

## 2020-05-18 ENCOUNTER — Other Ambulatory Visit: Payer: Self-pay

## 2020-05-18 NOTE — Patient Outreach (Signed)
Aging Gracefully Program  RN Visit  05/18/2020  BRITTNAE ASCHENBRENNER 11/28/1956 [id number]  Visit:   Telephonic nursing assessment #3  Start Time:   0954 End Time:   1020 Total Minutes:   26  Readiness To Change Score:     Universal RN Interventions: Calendar Distribution: Yes Exercise Review: Yes Medications: Yes Medication Changes: Yes Mood: Yes Pain: Yes PCP Advocacy/Support: No Fall Prevention: Yes Clinician View Of Client Situation: telephone visit:  patient reports she is busy helping her family. Client View Of His/Her Situation: Patient reports to me that it is a hectic time for her.  reports she is doing well. reports no falls. States she continues to take her medications as prescribed. Denies any medication changes. reports she continues to do her home exercises. denies any falls.  Healthcare Provider Communication:none    Clinician View of Client Situation: Diplomatic Services operational officer Of Client Situation: telephone visit:  patient reports she is busy helping her family. Client's View of His/Her Situation: Client View Of His/Her Situation: Patient reports to me that it is a hectic time for her.  reports she is doing well. reports no falls. States she continues to take her medications as prescribed. Denies any medication changes. reports she continues to do her home exercises. denies any falls.  Medication Assessment: no changes in medications. Reports taking all medication a prescribe.    Session Summary: Patient doing well and is not ready to schedule in home visit. Will follow up in 1 month.   Goals Addressed            This Visit's Progress   . Patient Stated       She will exercise daily by walking or working in her yard.   03/10/2020   Reviewed importance of healthy exercise and taking frequent break.  04/16/3020 Reports she continues to be active working in her yard. Reviewed home exercises that I will mail to patient. She has agreed to complete exercises and call me if  she does not understanding the exercises needed to complete.   05/18/2020  Patient reports she is continuing to exercise and is feeling strong. Denies any falls.        PLAN: Follow up in 1 month [name], Charity fundraiser, Scientist, research (physical sciences), [facility] [employer] [phone]

## 2020-06-01 ENCOUNTER — Telehealth: Payer: Self-pay | Admitting: Family Medicine

## 2020-06-01 DIAGNOSIS — E119 Type 2 diabetes mellitus without complications: Secondary | ICD-10-CM

## 2020-06-01 DIAGNOSIS — I10 Essential (primary) hypertension: Secondary | ICD-10-CM

## 2020-06-01 NOTE — Telephone Encounter (Signed)
Mary from [employer] called saying that Medicare will not cover the onetouch Verio test strips because the Rx says to check her sugars twice daily. Medicare will only cover if the patient is on insulin and the patient  is not. The pharmacy needs a new Rx saying to only check her sugars once daily in order for Medicare to cover. If not the pharmacy sent over a form to be filled out saying why the patient needs to check her sugars twice daily. Please f/u

## 2020-06-02 MED ORDER — ONETOUCH VERIO VI STRP: ORAL_STRIP | 6 refills | Status: DC

## 2020-06-02 MED ORDER — ONETOUCH DELICA PLUS LANCING MISC: 6 refills | Status: DC

## 2020-06-02 MED ORDER — ONETOUCH VERIO W/DEVICE KIT: PACK | 0 refills | Status: DC

## 2020-06-02 NOTE — Telephone Encounter (Signed)
Rx for updated frequency of testing.

## 2020-06-03 ENCOUNTER — Ambulatory Visit: Payer: Medicare Other | Admitting: Family Medicine

## 2020-06-15 ENCOUNTER — Other Ambulatory Visit: Payer: Self-pay

## 2020-06-15 NOTE — Patient Outreach (Signed)
Aging Gracefully Program  06/15/2020  Renee Murray 1956-07-10 [id number]   Placed call to patient to complete telephone visit. No answer. Left a message requesting a call back.  PLAN: will reattempt in 1 week if no response.  [name], RN, BSN, CEN [facility] [employer] [phone]

## 2020-06-23 ENCOUNTER — Ambulatory Visit: Payer: Medicare Other | Attending: Family Medicine | Admitting: Family Medicine

## 2020-06-23 ENCOUNTER — Encounter: Payer: Self-pay | Admitting: Family Medicine

## 2020-06-23 ENCOUNTER — Other Ambulatory Visit: Payer: Self-pay

## 2020-06-23 VITALS — BP 130/74 | HR 66 | Ht 59.0 in | Wt 145.0 lb

## 2020-06-23 DIAGNOSIS — Z1211 Encounter for screening for malignant neoplasm of colon: Secondary | ICD-10-CM

## 2020-06-23 DIAGNOSIS — Z791 Long term (current) use of non-steroidal anti-inflammatories (NSAID): Secondary | ICD-10-CM | POA: Diagnosis not present

## 2020-06-23 DIAGNOSIS — M1811 Unilateral primary osteoarthritis of first carpometacarpal joint, right hand: Secondary | ICD-10-CM

## 2020-06-23 DIAGNOSIS — Z8249 Family history of ischemic heart disease and other diseases of the circulatory system: Secondary | ICD-10-CM | POA: Diagnosis not present

## 2020-06-23 DIAGNOSIS — Z885 Allergy status to narcotic agent status: Secondary | ICD-10-CM | POA: Diagnosis not present

## 2020-06-23 DIAGNOSIS — Z1231 Encounter for screening mammogram for malignant neoplasm of breast: Secondary | ICD-10-CM | POA: Diagnosis not present

## 2020-06-23 DIAGNOSIS — Z79899 Other long term (current) drug therapy: Secondary | ICD-10-CM | POA: Insufficient documentation

## 2020-06-23 DIAGNOSIS — Z833 Family history of diabetes mellitus: Secondary | ICD-10-CM | POA: Insufficient documentation

## 2020-06-23 DIAGNOSIS — M19041 Primary osteoarthritis, right hand: Secondary | ICD-10-CM | POA: Insufficient documentation

## 2020-06-23 DIAGNOSIS — R6889 Other general symptoms and signs: Secondary | ICD-10-CM

## 2020-06-23 DIAGNOSIS — Z87820 Personal history of traumatic brain injury: Secondary | ICD-10-CM | POA: Insufficient documentation

## 2020-06-23 DIAGNOSIS — Z8679 Personal history of other diseases of the circulatory system: Secondary | ICD-10-CM

## 2020-06-23 DIAGNOSIS — G5621 Lesion of ulnar nerve, right upper limb: Secondary | ICD-10-CM

## 2020-06-23 DIAGNOSIS — E119 Type 2 diabetes mellitus without complications: Secondary | ICD-10-CM | POA: Diagnosis not present

## 2020-06-23 DIAGNOSIS — E1140 Type 2 diabetes mellitus with diabetic neuropathy, unspecified: Secondary | ICD-10-CM | POA: Insufficient documentation

## 2020-06-23 DIAGNOSIS — I10 Essential (primary) hypertension: Secondary | ICD-10-CM

## 2020-06-23 LAB — POCT GLYCOSYLATED HEMOGLOBIN (HGB A1C): HbA1c, POC (controlled diabetic range): 5.7 % (ref 0.0–7.0)

## 2020-06-23 MED ORDER — GABAPENTIN 300 MG PO CAPS: 300.0000 mg | ORAL_CAPSULE | Freq: Three times a day (TID) | ORAL | 3 refills | Status: DC

## 2020-06-23 MED ORDER — PREDNISONE 20 MG PO TABS
20.0000 mg | ORAL_TABLET | Freq: Every day | ORAL | 0 refills | Status: DC
Start: 2020-06-23 — End: 2021-01-06

## 2020-06-23 MED ORDER — AMLODIPINE BESYLATE 10 MG PO TABS: 10.0000 mg | ORAL_TABLET | Freq: Every day | ORAL | 1 refills | Status: DC

## 2020-06-23 MED ORDER — CITALOPRAM HYDROBROMIDE 10 MG PO TABS
10.0000 mg | ORAL_TABLET | Freq: Every day | ORAL | 3 refills | Status: DC
Start: 2020-06-23 — End: 2020-08-03

## 2020-06-23 MED ORDER — METHOCARBAMOL 500 MG PO TABS: 500.0000 mg | ORAL_TABLET | Freq: Three times a day (TID) | ORAL | 1 refills | Status: DC | PRN

## 2020-06-23 NOTE — Patient Instructions (Signed)
Arthritis Arthritis means joint pain. It can also mean joint disease. A joint is a place where bones come together. There are more than 100 types of arthritis. What are the causes? This condition may be caused by:  Wear and tear of a joint. This is the most common cause.  A lot of acid in the blood, which leads to pain in the joint (gout).  Pain and swelling (inflammation) in a joint.  Infection of a joint.  Injuries in the joint.  A reaction to medicines (allergy). In some cases, the cause may not be known. What are the signs or symptoms? Symptoms of this condition include:  Redness at a joint.  Swelling at a joint.  Stiffness at a joint.  Warmth coming from the joint.  A fever.  A feeling of being sick. How is this treated? This condition may be treated with:  Treating the cause, if it is known.  Rest.  Raising (elevating) the joint.  Putting cold or hot packs on the joint.  Medicines to treat symptoms and reduce pain and swelling.  Shots of medicines (cortisone) into the joint. You may also be told to make changes in your life, such as doing exercises and losing weight. Follow these instructions at home: Medicines  Take over-the-counter and prescription medicines only as told by your doctor.  Do not take aspirin for pain if your doctor says that you may have gout. Activity  Rest your joint if your doctor tells you to.  Avoid activities that make the pain worse.  Exercise your joint regularly as told by your doctor. Try doing exercises like: ? Swimming. ? Water aerobics. ? Biking. ? Walking. Managing pain, stiffness, and swelling      If told, put ice on the affected area. ? Put ice in a plastic bag. ? Place a towel between your skin and the bag. ? Leave the ice on for 20 minutes, 2-3 times per day.  If your joint is swollen, raise (elevate) it above the level of your heart if told by your doctor.  If your joint feels stiff in the morning,  try taking a warm shower.  If told, put heat on the affected area. Do this as often as told by your doctor. Use the heat source that your doctor recommends, such as a moist heat pack or a heating pad. If you have diabetes, do not apply heat without asking your doctor. To apply heat: ? Place a towel between your skin and the heat source. ? Leave the heat on for 20-30 minutes. ? Remove the heat if your skin turns bright red. This is very important if you are unable to feel pain, heat, or cold. You may have a greater risk of getting burned. General instructions  Do not use any products that contain nicotine or tobacco, such as cigarettes, e-cigarettes, and chewing tobacco. If you need help quitting, ask your doctor.  Keep all follow-up visits as told by your doctor. This is important. Contact a doctor if:  The pain gets worse.  You have a fever. Get help right away if:  You have very bad pain in your joint.  You have swelling in your joint.  Your joint is red.  Many joints become painful and swollen.  You have very bad back pain.  Your leg is very weak.  You cannot control your pee (urine) or poop (stool). Summary  Arthritis means joint pain. It can also mean joint disease. A joint is a place   where bones come together.  The most common cause of this condition is wear and tear of a joint.  Symptoms of this condition include redness, swelling, or stiffness of the joint.  This condition is treated with rest, raising the joint, medicines, and putting cold or hot packs on the joint.  Follow your doctor's instructions about medicines, activity, exercises, and other home care treatments. This information is not intended to replace advice given to you by your health care provider. Make sure you discuss any questions you have with your health care provider. Document Revised: 11/11/2018 Document Reviewed: 11/11/2018 Elsevier Patient Education  2020 [employer]  

## 2020-06-23 NOTE — Progress Notes (Signed)
Subjective:  Patient ID: Renee Murray, female    DOB: 1956/07/03  Age: 64 y.o. MRN: [id number]  CC: Hypertension and Diabetes   HPI Renee Murray is a 64 year old female with a history of previous subarachnoid hemorrhage secondary to rupture of brain aneurysm, hypertension, diabetes (A1c of 5.7) She was previously prediabetic with A1c in the 5.8-5.9 range until 10/2019 when she had  a high A1c of 7.9.  Her diabetes is diet controlled.  She has been under a lot of stress as she had 8 people who moved in to stay with her and recently moved out.  She is getting her stress under better control now. 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 Pain occurs on medial aspect of R hand radiating up to her inner arm uncontrolled on current Gabapentin dose. Lying down worsens the pain. It is burning but she has no numbness and tingling. She is R handed. R thumb hurts from previous work place injury; she was told she had arthritis. Uses ibuprofen 800 mg intermittently for pain.  She is requesting a memory test as recommended by her daughter as she is constantly looking for something states but denies forgetting to turn off the stove, denies forgetting directions and has no problem keeping track of her finances.Her father had dementia in his 99s. She has a sister at 25 yrs of age with a CVA and dementia She uses Buspar for anxiety prn not on a regular basis because it sometimes causes headache.  Past Medical History:  Diagnosis Date  . Brain aneurysm   . Diabetes mellitus without complication ([facility])   . Hypertension     Past Surgical History:  Procedure Laterality Date  . ABDOMINAL HYSTERECTOMY    . CHOLECYSTECTOMY    . CRANIOTOMY Right 02/04/2015   Procedure: RIGHT CRANIOTOMY FOR ANEURYSM ;  Surgeon: Consuella Lose, MD;  Location: [facility];  Service: Neurosurgery;  Laterality: Right;  . IR GENERIC HISTORICAL  02/04/2015   IR  ANGIO INTRA EXTRACRAN SEL COM CAROTID INNOMINATE BILAT MOD SED 02/04/2015 Consuella Lose, MD [facility]  . IR GENERIC HISTORICAL  02/04/2015   IR ANGIO VERTEBRAL SEL SUBCLAVIAN INNOMINATE UNI R MOD SED 02/04/2015 Consuella Lose, MD [facility]  . IR GENERIC HISTORICAL  02/04/2015   IR ANGIO VERTEBRAL SEL VERTEBRAL UNI L MOD SED 02/04/2015 Consuella Lose, MD [facility]    Family History  Problem Relation Age of Onset  . Diabetes Mother   . Heart failure Mother   . Cancer Father     Allergies  Allergen Reactions  . Demerol Other (See Comments)    "almost died"  . Morphine And Related Other (See Comments)    "Almost Died"    Outpatient Medications Prior to Visit  Medication Sig Dispense Refill  . Blood Glucose Monitoring Suppl (ONETOUCH VERIO) w/Device KIT Use to test blood sugar once daily. Dx: E11.69 and I10 1 kit 0  . cetirizine (ZYRTEC) 10 MG tablet Take 1 tablet (10 mg total) by mouth daily. 30 tablet 1  . fluticasone (FLONASE) 50 MCG/ACT nasal spray Place 2 sprays into both nostrils daily as needed for allergies or rhinitis. 1 g 5  . glucose blood (ONETOUCH VERIO) test strip Use to check blood sugar once daily. 100 each 6  . Lancet Devices (ONETOUCH DELICA PLUS LANCING) MISC Use to check blood sugar once daily. 1 each 6  . trolamine  salicylate (ASPERCREME) 10 % cream Apply 1 application topically as needed for muscle pain.    Renee Murray Kitchen amLODipine (NORVASC) 10 MG tablet Take 1 tablet (10 mg total) by mouth daily. To lower blood pressure 90 tablet 1  . busPIRone (BUSPAR) 10 MG tablet Take 0.5 tablets (5 mg total) by mouth 2 (two) times daily. 30 tablet 1  . gabapentin (NEURONTIN) 300 MG capsule Take 1 capsule (300 mg total) by mouth 3 (three) times daily. 90 capsule 1  . OneTouch Delica Lancets 73S MISC Test blood sugars in the morning before eating and 2 hours after largest meal or bedtime  E11.69 and I10 (Patient not taking: Reported on 06/23/2020) 100 each 11  . ibuprofen  (ADVIL,MOTRIN) 600 MG tablet Take 1 tablet (600 mg total) by mouth every 8 (eight) hours as needed for mild pain or moderate pain. (Patient not taking: Reported on 06/23/2020) 30 tablet 0  . methocarbamol (ROBAXIN) 500 MG tablet Take 1 tablet (500 mg total) by mouth 2 (two) times daily as needed for muscle spasms. (Patient not taking: Reported on 03/10/2020) 20 tablet 0  . naproxen (NAPROSYN) 500 MG tablet Take 1 tablet (500 mg total) by mouth 2 (two) times daily. (Patient not taking: Reported on 03/10/2020) 30 tablet 0   No facility-administered medications prior to visit.     ROS Review of Systems  Constitutional: Negative for activity change, appetite change and fatigue.  HENT: Negative for congestion, sinus pressure and sore throat.   Eyes: Negative for visual disturbance.  Respiratory: Negative for cough, chest tightness, shortness of breath and wheezing.   Cardiovascular: Negative for chest pain and palpitations.  Gastrointestinal: Negative for abdominal distention, abdominal pain and constipation.  Endocrine: Negative for polydipsia.  Genitourinary: Negative for dysuria and frequency.  Musculoskeletal:       See HPI  Skin: Negative for rash.  Neurological: Negative for tremors, light-headedness and numbness.  Hematological: Does not bruise/bleed easily.  Psychiatric/Behavioral: Negative for agitation and behavioral problems.    Objective:  BP 130/74   Pulse 66   Ht 4' 11"  (1.499 m)   Wt 145 lb (65.8 kg)   SpO2 97%   BMI 29.29 kg/m   BP/Weight 06/23/2020 11/10/2019 28/76/8115  Systolic BP 726 203 559  Diastolic BP 74 77 69  Wt. (Lbs) 145 - 152  BMI 29.29 - 30.7      Physical Exam Constitutional:      Appearance: She is well-developed.  Neck:     Vascular: No JVD.  Cardiovascular:     Rate and Rhythm: Normal rate.     Heart sounds: Normal heart sounds. No murmur heard.   Pulmonary:     Effort: Pulmonary effort is normal.     Breath sounds: Normal breath sounds.  No wheezing or rales.  Chest:     Chest wall: No tenderness.  Abdominal:     General: Bowel sounds are normal. There is no distension.     Palpations: Abdomen is soft. There is no mass.     Tenderness: There is no abdominal tenderness.  Musculoskeletal:        General: Normal range of motion.     Right lower leg: No edema.     Left lower leg: No edema.     Comments: Slight right thumb MCP and PIP joint tenderness Able to make a fist bilaterally Normal range of motion of bilateral upper extremities; negative Hawkins and Neer signs bilaterally  Neurological:     Mental Status: She is  alert and oriented to person, place, and time.  Psychiatric:        Mood and Affect: Mood normal.    MMSE - Mini Mental State Exam 06/23/2020  Orientation to time 5  Orientation to Place 5  Registration 3  Attention/ Calculation 5  Recall 3  Language- name 2 objects 2  Language- repeat 1  Language- follow 3 step command 3  Language- read & follow direction 1  Write a sentence 1  Copy design 1  Total score 30      CMP Latest Ref Rng & Units 10/21/2019 01/04/2018 03/27/2017  Glucose 65 - 99 mg/dL 84 89 92  BUN 8 - 27 mg/dL 12 12 8   Creatinine 0.57 - 1.00 mg/dL 0.58 0.63 0.73  Sodium 134 - 144 mmol/L 142 141 141  Potassium 3.5 - 5.2 mmol/L 4.5 4.3 4.0  Chloride 96 - 106 mmol/L 105 106 106  CO2 20 - 29 mmol/L 24 21 26   Calcium 8.7 - 10.3 mg/dL 9.1 8.7 8.6  Total Protein 6.0 - 8.5 g/dL 6.6 - -  Total Bilirubin 0.0 - 1.2 mg/dL <0.2 - -  Alkaline Phos 39 - 117 IU/L 105 - -  AST 0 - 40 IU/L 15 - -  ALT 0 - 32 IU/L 16 - -    Lipid Panel     Component Value Date/Time   CHOL 160 10/21/2019 0939   TRIG 103 10/21/2019 0939   HDL 49 10/21/2019 0939   CHOLHDL 3.3 10/21/2019 0939   CHOLHDL 4.3 03/27/2017 1016   VLDL 26 03/27/2017 1016   LDLCALC 92 10/21/2019 0939    CBC    Component Value Date/Time   WBC 9.1 08/30/2016 2110   RBC 4.34 08/30/2016 2110   HGB 12.8 08/30/2016 2110   HCT 39.1  08/30/2016 2110   PLT 171 08/30/2016 2110   MCV 90.1 08/30/2016 2110   MCH 29.5 08/30/2016 2110   MCHC 32.7 08/30/2016 2110   RDW 14.6 08/30/2016 2110   LYMPHSABS 2.6 08/30/2016 2110   MONOABS 0.7 08/30/2016 2110   EOSABS 0.1 08/30/2016 2110   BASOSABS 0.0 08/30/2016 2110    Lab Results  Component Value Date   HGBA1C 5.7 06/23/2020    Assessment & Plan:  1. Type 2 diabetes mellitus without complication, without long-term current use of insulin (HCC) Controlled with A1c of 5.7; goal is less than 7.0 Diet controlled Commended on improvement Counseled on Diabetic diet, my plate method, 573 minutes of moderate intensity exercise/week Blood sugar logs with fasting goals of 80-120 mg/dl, random of less than 180 and in the event of sugars less than 60 mg/dl or greater than 400 mg/dl encouraged to notify the clinic. Advised on the need for annual eye exams, annual foot exams, Pneumonia vaccine. - POCT glycosylated hemoglobin (Hb A1C) - Microalbumin / creatinine urine ratio  2. Encounter for screening mammogram for malignant neoplasm of breast - MM DIGITAL SCREENING BILATERAL; Future  3. Colon cancer screening - Cologuard  4. Essential hypertension Controlled Counseled on blood pressure goal of less than 130/80, low-sodium, DASH diet, medication compliance, 150 minutes of moderate intensity exercise per week. Discussed medication compliance, adverse effects. - amLODipine (NORVASC) 10 MG tablet; Take 1 tablet (10 mg total) by mouth daily. To lower blood pressure  Dispense: 90 tablet; Refill: 1  5. History of spontaneous subarachnoid intracranial hemorrhage due to cerebral aneurysm Risk factor modification  6. Ulnar neuropathy of right upper extremity Uncontrolled Robaxin added to regimen - gabapentin (NEURONTIN)  300 MG capsule; Take 1 capsule (300 mg total) by mouth 3 (three) times daily.  Dispense: 90 capsule; Refill: 3 - methocarbamol (ROBAXIN) 500 MG tablet; Take 1 tablet (500  mg total) by mouth every 8 (eight) hours as needed for muscle spasms.  Dispense: 60 tablet; Refill: 1  7. Osteoarthritis of thumb, right Short course of prednisone; she declines maintenance therapy with NSAID Once completed advised to use ibuprofen as needed  8. Forgetfulness Normal Mini-Mental state exam   Health Care Maintenance: Mammogram and Cologuard ordered Meds ordered this encounter  Medications  . amLODipine (NORVASC) 10 MG tablet    Sig: Take 1 tablet (10 mg total) by mouth daily. To lower blood pressure    Dispense:  90 tablet    Refill:  1  . citalopram (CELEXA) 10 MG tablet    Sig: Take 1 tablet (10 mg total) by mouth daily.    Dispense:  30 tablet    Refill:  3    Discontinue Buspar  . predniSONE (DELTASONE) 20 MG tablet    Sig: Take 1 tablet (20 mg total) by mouth daily with breakfast.    Dispense:  5 tablet    Refill:  0  . gabapentin (NEURONTIN) 300 MG capsule    Sig: Take 1 capsule (300 mg total) by mouth 3 (three) times daily.    Dispense:  90 capsule    Refill:  3  . methocarbamol (ROBAXIN) 500 MG tablet    Sig: Take 1 tablet (500 mg total) by mouth every 8 (eight) hours as needed for muscle spasms.    Dispense:  60 tablet    Refill:  1    Follow-up: Return in about 3 months (around 09/23/2020) for Chronic conditions with PCP.       Charlott Rakes, MD, FAAFP. [facility] [place], [place]   06/23/2020, 12:53 PM

## 2020-06-23 NOTE — Progress Notes (Signed)
States that she wants to have a memory test done.  Pain in right arm due to neuropathy. Pain radiates up arm to shoulder.

## 2020-06-28 ENCOUNTER — Telehealth: Payer: Self-pay | Admitting: Family Medicine

## 2020-06-28 ENCOUNTER — Other Ambulatory Visit: Payer: Self-pay | Admitting: Internal Medicine

## 2020-06-28 DIAGNOSIS — G5621 Lesion of ulnar nerve, right upper limb: Secondary | ICD-10-CM

## 2020-06-28 NOTE — Telephone Encounter (Signed)
Please advise   Copied from CRM [id number]. Topic: General - Other >> Jun 28, 2020 10:08 AM Tamela Oddi wrote: Reason for CRM: Patient called to inform the doctor that thep ain meds. She is taking for her arm is not working well.  She would like to know if it is an option to get a type of sleeve restriction that she can put between her fingers up the elbow that would help.  Please advise and call patient to let her know at [phone]

## 2020-06-28 NOTE — Telephone Encounter (Signed)
Please forward to Dr. Alvis Lemmings who saw this patient for this complaint on 7/7.   [name], D.O. Primary Care at [facility]  06/28/2020, 1:31 PM

## 2020-06-28 NOTE — Telephone Encounter (Signed)
I have referred her to orthopedics for additional management as I am unsure if a sling would be beneficial.

## 2020-06-28 NOTE — Addendum Note (Signed)
Addended byHoy Register on: 06/28/2020 05:19 PM   Modules accepted: Orders

## 2020-07-07 ENCOUNTER — Telehealth: Payer: Self-pay | Admitting: Family Medicine

## 2020-07-07 NOTE — Telephone Encounter (Signed)
Yes, we can provide refills in this manner.   [name], D.O. Primary Care at [facility]  07/07/2020, 4:20 PM

## 2020-07-07 NOTE — Telephone Encounter (Signed)
Copied from CRM [id number]. Topic: General - Inquiry >> Jul 06, 2020 11:19 AM Leary Roca wrote: Reason for CRM: Pt called in stating she would like all her medications sent over to the 90 day supply [facility] which is covered through her insurance  Pt will continue picking up gabapentin (NEURONTIN) 300 MG capsule [[id number] from [facility] . Please advise

## 2020-07-09 ENCOUNTER — Ambulatory Visit: Payer: Medicare Other | Admitting: Orthopaedic Surgery

## 2020-07-13 ENCOUNTER — Other Ambulatory Visit: Payer: Self-pay | Admitting: Family Medicine

## 2020-07-13 DIAGNOSIS — N63 Unspecified lump in unspecified breast: Secondary | ICD-10-CM

## 2020-07-15 ENCOUNTER — Other Ambulatory Visit: Payer: Self-pay

## 2020-07-16 ENCOUNTER — Other Ambulatory Visit: Payer: Self-pay | Admitting: Family Medicine

## 2020-07-16 DIAGNOSIS — G5621 Lesion of ulnar nerve, right upper limb: Secondary | ICD-10-CM

## 2020-07-16 DIAGNOSIS — I10 Essential (primary) hypertension: Secondary | ICD-10-CM

## 2020-07-16 DIAGNOSIS — J309 Allergic rhinitis, unspecified: Secondary | ICD-10-CM

## 2020-07-16 MED ORDER — AMLODIPINE BESYLATE 10 MG PO TABS: 10.0000 mg | ORAL_TABLET | Freq: Every day | ORAL | 1 refills | Status: DC

## 2020-07-16 MED ORDER — FLUTICASONE PROPIONATE 50 MCG/ACT NA SUSP: 2.0000 | Freq: Every day | NASAL | 5 refills | Status: DC | PRN

## 2020-07-16 MED ORDER — GABAPENTIN 300 MG PO CAPS: 300.0000 mg | ORAL_CAPSULE | Freq: Three times a day (TID) | ORAL | 1 refills | Status: DC

## 2020-07-16 NOTE — Telephone Encounter (Signed)
Copied from CRM [id number]. Topic: Quick Communication - Rx Refill/Question >> Jul 16, 2020  9:03 AM Mcneil, Ja-Kwan wrote: Medication: fluticasone (FLONASE) 50 MCG/ACT nasal spray, amLODipine (NORVASC) 10 MG tablet, gabapentin (NEURONTIN) 300 MG capsule, and citalopram (CELEXA) 10 MG tablet  Has the patient contacted their pharmacy? yes   Preferred Pharmacy (with phone number or street name): [facility] - [place], [place] - [address]  Phone: [phone]  Fax: [phone]  Agent: Please be advised that RX refills may take up to 3 business days. We ask that you follow-up with your pharmacy.

## 2020-07-16 NOTE — Patient Outreach (Signed)
Aging Gracefully Program  RN Visit  07/16/2020  Renee Murray Feb 08, 1956 [id number]  Visit:   RN home visit #4  Start Time:   1400 End Time:   1435 Total Minutes:   35  Readiness To Change Score:     Universal RN Interventions: Exercise Review: Yes Medications: Yes Medication Changes: Yes Mood: Yes Pain: Yes Fall Prevention: Yes Incontinence: Yes Clinician View Of Client Situation: Home visit.  Patient ambulating without any difficulty in her home. Grandson present for home visit. Patient cooking fish sticks. Client View Of His/Her Situation: Patient reports things are much more settled. Reports she is happy her son is staying with her some. Reports repairs competed .  Reports right wrist and shoulder pain. Has an appointment with ortho on August 6th. denies any recent falls. Reports taking muscle relax and ibuprofen for pain.  Healthcare Provider Communication:    Diplomatic Services operational officer of Client Situation: Diplomatic Services operational officer Of Client Situation: Home visit.  Patient ambulating without any difficulty in her home. Grandson present for home visit. Patient cooking fish sticks. Client's View of His/Her Situation: Client View Of His/Her Situation: Patient reports things are much more settled. Reports she is happy her son is staying with her some. Reports repairs competed .  Reports right wrist and shoulder pain. Has an appointment with ortho on August 6th. denies any recent falls. Reports taking muscle relax and ibuprofen for pain.  Medication Assessment: denies any changes with the exception of Methocarbamol and ibuprofen     Session Summary:(1)home repairs competed (2) right shoulder and wrist pain. Pending ortho consult on 8/5, mammogram on 07/23/20  Reviewed home exercise plan and handout. Will plan follow up via phone in 2 weeks and then plan to close to nursing.  Goals    . Blood Pressure < 150/90    . Patient Stated     Increase safety and independence getting in and out of  bed.  03/10/2020  Reviewed with patient concern for the height of her bed. Patient states she does not use a step stool.      . Patient Stated     Increase safety entering and exiting home.  03/10/2020  Reviewed goal with patient. Patient is concerned about loose banister at the front entrance of her home.    . Patient Stated     She will exercise daily by walking or working in her yard.   03/10/2020   Reviewed importance of healthy exercise and taking frequent break.  04/16/3020 Reports she continues to be active working in her yard. Reviewed home exercises that I will mail to patient. She has agreed to complete exercises and call me if she does not understanding the exercises needed to complete.   05/18/2020  Patient reports she is continuing to exercise and is feeling strong. Denies any falls.   07/15/2020  Provided another exercise packet and reviewed home exercises to help improve strength. Demonstrated exercises and patient did return demonstration.  Reviewed safety precautions.  [name], RN, BSN, CEN [facility] Coordinator [phone]         [name], RN, BSN, CEN [facility] [employer] [phone]

## 2020-07-22 ENCOUNTER — Ambulatory Visit (HOSPITAL_COMMUNITY)
Admission: EM | Admit: 2020-07-22 | Discharge: 2020-07-22 | Disposition: A | Discharge status: Nursing Home (Includes ICF) | Payer: Medicare Other | Attending: Family Medicine | Admitting: Family Medicine

## 2020-07-22 ENCOUNTER — Ambulatory Visit (INDEPENDENT_AMBULATORY_CARE_PROVIDER_SITE_OTHER): Payer: Medicare Other | Admitting: Orthopaedic Surgery

## 2020-07-22 ENCOUNTER — Emergency Department (HOSPITAL_COMMUNITY)
Admission: EM | Admit: 2020-07-22 | Discharge: 2020-07-22 | Disposition: A | Discharge status: Home / Self Care | Payer: Medicare Other | Attending: Emergency Medicine | Admitting: Emergency Medicine

## 2020-07-22 ENCOUNTER — Emergency Department (HOSPITAL_COMMUNITY): Payer: Medicare Other

## 2020-07-22 ENCOUNTER — Ambulatory Visit (INDEPENDENT_AMBULATORY_CARE_PROVIDER_SITE_OTHER): Payer: Medicare Other

## 2020-07-22 ENCOUNTER — Ambulatory Visit: Payer: Self-pay | Admitting: *Deleted

## 2020-07-22 ENCOUNTER — Other Ambulatory Visit: Payer: Self-pay

## 2020-07-22 ENCOUNTER — Encounter: Payer: Self-pay | Admitting: Orthopaedic Surgery

## 2020-07-22 VITALS — Ht <= 58 in | Wt 144.0 lb

## 2020-07-22 DIAGNOSIS — I10 Essential (primary) hypertension: Secondary | ICD-10-CM | POA: Insufficient documentation

## 2020-07-22 DIAGNOSIS — R202 Paresthesia of skin: Secondary | ICD-10-CM | POA: Diagnosis present

## 2020-07-22 DIAGNOSIS — G8929 Other chronic pain: Secondary | ICD-10-CM

## 2020-07-22 DIAGNOSIS — R531 Weakness: Secondary | ICD-10-CM | POA: Insufficient documentation

## 2020-07-22 DIAGNOSIS — E1140 Type 2 diabetes mellitus with diabetic neuropathy, unspecified: Secondary | ICD-10-CM | POA: Diagnosis not present

## 2020-07-22 DIAGNOSIS — R52 Pain, unspecified: Secondary | ICD-10-CM | POA: Diagnosis not present

## 2020-07-22 DIAGNOSIS — I671 Cerebral aneurysm, nonruptured: Secondary | ICD-10-CM | POA: Diagnosis not present

## 2020-07-22 DIAGNOSIS — M25511 Pain in right shoulder: Secondary | ICD-10-CM

## 2020-07-22 DIAGNOSIS — I639 Cerebral infarction, unspecified: Secondary | ICD-10-CM | POA: Diagnosis not present

## 2020-07-22 DIAGNOSIS — R079 Chest pain, unspecified: Secondary | ICD-10-CM | POA: Diagnosis not present

## 2020-07-22 DIAGNOSIS — F17210 Nicotine dependence, cigarettes, uncomplicated: Secondary | ICD-10-CM | POA: Diagnosis not present

## 2020-07-22 DIAGNOSIS — I959 Hypotension, unspecified: Secondary | ICD-10-CM | POA: Diagnosis not present

## 2020-07-22 DIAGNOSIS — R29818 Other symptoms and signs involving the nervous system: Secondary | ICD-10-CM | POA: Diagnosis not present

## 2020-07-22 LAB — COMPREHENSIVE METABOLIC PANEL
ALT: 14 U/L (ref 0–44)
AST: 21 U/L (ref 15–41)
Albumin: 3.6 g/dL (ref 3.5–5.0)
Alkaline Phosphatase: 73 U/L (ref 38–126)
Anion gap: 8 (ref 5–15)
BUN: 10 mg/dL (ref 8–23)
CO2: 26 mmol/L (ref 22–32)
Calcium: 8.9 mg/dL (ref 8.9–10.3)
Chloride: 105 mmol/L (ref 98–111)
Creatinine, Ser: 0.72 mg/dL (ref 0.44–1.00)
GFR calc Af Amer: >60 mL/min (ref >60)
GFR calc non Af Amer: >60 mL/min (ref >60)
Glucose, Bld: 124 mg/dL — ABNORMAL HIGH (ref 70–99)
Potassium: 3.7 mmol/L (ref 3.5–5.1)
Sodium: 139 mmol/L (ref 135–145)
Total Bilirubin: 0.4 mg/dL (ref 0.3–1.2)
Total Protein: 6.3 g/dL — ABNORMAL LOW (ref 6.5–8.1)

## 2020-07-22 LAB — CBC
HCT: 39.3 % (ref 36.0–46.0)
Hemoglobin: 12.8 g/dL (ref 12.0–15.0)
MCH: 29.4 pg (ref 26.0–34.0)
MCHC: 32.6 g/dL (ref 30.0–36.0)
MCV: 90.3 fL (ref 80.0–100.0)
Platelets: 175 10*3/uL (ref 150–400)
RBC: 4.35 MIL/uL (ref 3.87–5.11)
RDW: 14.3 % (ref 11.5–15.5)
WBC: 6.5 10*3/uL (ref 4.0–10.5)
nRBC: 0 % (ref 0.0–0.2)

## 2020-07-22 LAB — I-STAT CHEM 8, ED
BUN: 10 mg/dL (ref 8–23)
Calcium, Ion: 1.18 mmol/L (ref 1.15–1.40)
Chloride: 103 mmol/L (ref 98–111)
Creatinine, Ser: 0.6 mg/dL (ref 0.44–1.00)
Glucose, Bld: 125 mg/dL — ABNORMAL HIGH (ref 70–99)
HCT: 40 % (ref 36.0–46.0)
Hemoglobin: 13.6 g/dL (ref 12.0–15.0)
Potassium: 3.5 mmol/L (ref 3.5–5.1)
Sodium: 143 mmol/L (ref 135–145)
TCO2: 24 mmol/L (ref 22–32)

## 2020-07-22 LAB — TROPONIN I (HIGH SENSITIVITY)
Troponin I (High Sensitivity): 5 ng/L (ref <18)
Troponin I (High Sensitivity): 9 ng/L (ref <18)

## 2020-07-22 LAB — DIFFERENTIAL
Abs Immature Granulocytes: 0.01 10*3/uL (ref 0.00–0.07)
Basophils Absolute: 0 10*3/uL (ref 0.0–0.1)
Basophils Relative: 1 %
Eosinophils Absolute: 0.2 10*3/uL (ref 0.0–0.5)
Eosinophils Relative: 3 %
Immature Granulocytes: 0 %
Lymphocytes Relative: 44 %
Lymphs Abs: 2.9 10*3/uL (ref 0.7–4.0)
Monocytes Absolute: 0.4 10*3/uL (ref 0.1–1.0)
Monocytes Relative: 7 %
Neutro Abs: 2.9 10*3/uL (ref 1.7–7.7)
Neutrophils Relative %: 45 %

## 2020-07-22 LAB — PROTIME-INR
INR: 1.1 (ref 0.8–1.2)
Prothrombin Time: 13.5 seconds (ref 11.4–15.2)

## 2020-07-22 LAB — CBG MONITORING, ED
Glucose-Capillary: 131 mg/dL — ABNORMAL HIGH (ref 70–99)
Glucose-Capillary: 123 mg/dL — ABNORMAL HIGH (ref 70–99)

## 2020-07-22 LAB — APTT: aPTT: 35 seconds (ref 24–36)

## 2020-07-22 MED ORDER — IOHEXOL 350 MG/ML SOLN
75.0000 mL | Freq: Once | INTRAVENOUS | Status: AC | PRN
  Administered 2020-07-22: 75 mL via INTRAVENOUS

## 2020-07-22 MED ORDER — SODIUM CHLORIDE 0.9% FLUSH
3.0000 mL | Freq: Once | INTRAVENOUS | Status: DC
Start: 2020-07-22 — End: 2020-07-22

## 2020-07-22 NOTE — ED Notes (Signed)
EMS called for emergent transport for CVA. Advised this RN they are enroute.

## 2020-07-22 NOTE — ED Notes (Signed)
EMS on scene for transport

## 2020-07-22 NOTE — Discharge Instructions (Addendum)
Your heart looks fine.  Follow-up with either of the neurology groups listed.  Return for worsening symptoms.

## 2020-07-22 NOTE — Telephone Encounter (Signed)
  C/o left jaw and left arm pain this am. "didn't last long" but has happened several times before. Left arm becomes weak . No pain , numbness, chest pain, SOB, dizziness, vision changes at this time. Instructed patient to go to urgent care or ED since pain has happened several times in the last few months. Care advise given. Patient verbalized understanding of care advise.   Reason for Disposition . [1] Arm pains with exertion (e.g., walking) AND [2] pain goes away on resting AND [3] not present now  Answer Assessment - Initial Assessment Questions 1. ONSET: "When did the pain start?"     This am 2. LOCATION: "Where is the pain located?"     Left jaw area radiating to left arm 3. PAIN: "How bad is the pain?" (Scale 1-10; or mild, moderate, severe)   - MILD (1-3): doesn't interfere with normal activities   - MODERATE (4-7): interferes with normal activities (e.g., work or school) or awakens from sleep   - SEVERE (8-10): excruciating pain, unable to do any normal activities, unable to hold a cup of water     Left arm became weak 4. WORK OR EXERCISE: "Has there been any recent work or exercise that involved this part of the body?"     no 5. CAUSE: "What do you think is causing the arm pain?"     I dont know 6. OTHER SYMPTOMS: "Do you have any other symptoms?" (e.g., neck pain, swelling, rash, fever, numbness, weakness)     No lightheadedness this year with similar episode in June 7. PREGNANCY: "Is there any chance you are pregnant?" "When was your last menstrual period?"     na  Protocols used: ARM PAIN-A-AH

## 2020-07-22 NOTE — ED Provider Notes (Signed)
Emergency Department Provider Note   I have reviewed the triage vital signs and the nursing notes.   HISTORY  Chief Complaint Code Stroke   HPI Renee Murray is a 64 y.o. female with past medical history reviewed below presents to the emergency department with pain in tingling in the left upper extremity, left jaw/face, and subjective weakness.  Symptoms are intermittent and have been occurring more frequently.  Patient feels that her left leg remains weak but other symptoms have resolved.  She denies chest pain or shortness of breath.  She was last normal at 9 AM.  Patient arrives by EMS with code stroke activated in the field. Denies CP, SOB, back pain, or abdominal pain currently. No radiation of symptoms or modifying factors.    Past Medical History:  Diagnosis Date  . Brain aneurysm   . Diabetes mellitus without complication (HCC)   . Hypertension     Patient Active Problem List   Diagnosis Date Noted  . Acute midline low back pain 12/27/2016  . Acute pain of both shoulders 12/27/2016  . Rotator cuff impingement syndrome 12/27/2016  . Essential hypertension 01/25/2016  . Chronic pain syndrome 01/25/2016  . Prediabetes 10/13/2015  . Anxiety about health 09/13/2015  . Neuropathy 09/13/2015  . Chronic generalized pain 09/13/2015  . History of seasonal allergies 09/13/2015  . SAH (subarachnoid hemorrhage) (HCC)   . Subarachnoid hemorrhage (HCC) 02/04/2015  . History of sinus disease on CT 08/10/2014  . Hypertension, goal below 150/90 03/17/2014  . Health care maintenance 03/17/2014  . Tobacco abuse 03/17/2014  . Neuropathy, upper extremity bilaterally  08/06/2013    Past Surgical History:  Procedure Laterality Date  . ABDOMINAL HYSTERECTOMY    . CHOLECYSTECTOMY    . CRANIOTOMY Right 02/04/2015   Procedure: RIGHT CRANIOTOMY FOR ANEURYSM ;  Surgeon: Lisbeth RenshawNeelesh Nundkumar, MD;  Location: [facility];  Service: Neurosurgery;  Laterality: Right;  . IR GENERIC  HISTORICAL  02/04/2015   IR ANGIO INTRA EXTRACRAN SEL COM CAROTID INNOMINATE BILAT MOD SED 02/04/2015 Lisbeth RenshawNeelesh Nundkumar, MD [facility]  . IR GENERIC HISTORICAL  02/04/2015   IR ANGIO VERTEBRAL SEL SUBCLAVIAN INNOMINATE UNI R MOD SED 02/04/2015 Lisbeth RenshawNeelesh Nundkumar, MD [facility]  . IR GENERIC HISTORICAL  02/04/2015   IR ANGIO VERTEBRAL SEL VERTEBRAL UNI L MOD SED 02/04/2015 Lisbeth RenshawNeelesh Nundkumar, MD [facility]    Allergies Demerol and Morphine and related  Family History  Problem Relation Age of Onset  . Diabetes Mother   . Heart failure Mother   . Cancer Father     Social History Social History   Tobacco Use  . Smoking status: Current Every Day Smoker    Packs/day: 0.00    Years: 20.00    Pack years: 0.00    Types: Cigarettes  . Smokeless tobacco: Never Used  . Tobacco comment: reports 1 pack per month  Vaping Use  . Vaping Use: Never used  Substance Use Topics  . Alcohol use: Yes    Comment: occasionally  . Drug use: No    Review of Systems  Constitutional: No fever/chills Eyes: No visual changes. ENT: No sore throat. Cardiovascular: Denies chest pain. Respiratory: Denies shortness of breath. Gastrointestinal: No abdominal pain.  No nausea, no vomiting.  No diarrhea.  No constipation. Genitourinary: Negative for dysuria. Musculoskeletal: Negative for back pain. Skin: Negative for rash. Neurological: Intermittent pain in the jaw/left face radiating to the left arm. Subjective weakness in the left leg.   10-point ROS otherwise negative.  ____________________________________________   PHYSICAL EXAM:  VITAL SIGNS: Vitals:   07/22/20 1700 07/22/20 1816  BP: (!) 156/69 (!) 138/58  Pulse:  (!) 59  Resp: 16 15  Temp:  98.1 F (36.7 C)  SpO2:  99%   Constitutional: Alert and oriented. Well appearing and in no acute distress. Eyes: Conjunctivae are normal. PERRL. Head: Atraumatic. Nose: No congestion/rhinnorhea. Mouth/Throat: Mucous membranes are  moist. Neck: No stridor.   Cardiovascular: Normal rate, regular rhythm. Good peripheral circulation. Grossly normal heart sounds.   Respiratory: Normal respiratory effort.  No retractions. Lungs CTAB. Gastrointestinal: Soft and nontender. No distention.  Musculoskeletal: No lower extremity tenderness nor edema. No gross deformities of extremities. Neurologic:  Normal speech and language. No gross focal neurologic deficits are appreciated. No facial asymmetry. 5/5 strength in the upper and lower extremities. No focal neuro deficit.  Skin:  Skin is warm, dry and intact. No rash noted.   ____________________________________________   LABS (all labs ordered are listed, but only abnormal results are displayed)  Labs Reviewed  COMPREHENSIVE METABOLIC PANEL - Abnormal; Notable for the following components:      Result Value   Glucose, Bld 124 (*)    Total Protein 6.3 (*)    All other components within normal limits  I-STAT CHEM 8, ED - Abnormal; Notable for the following components:   Glucose, Bld 125 (*)    All other components within normal limits  CBG MONITORING, ED - Abnormal; Notable for the following components:   Glucose-Capillary 123 (*)    All other components within normal limits  PROTIME-INR  APTT  CBC  DIFFERENTIAL  TROPONIN I (HIGH SENSITIVITY)  TROPONIN I (HIGH SENSITIVITY)   ____________________________________________  EKG   EKG Interpretation  Date/Time:  Thursday July 22 2020 13:59:12 EDT Ventricular Rate:  60 PR Interval:    QRS Duration: 84 QT Interval:  402 QTC Calculation: 402 R Axis:   63 Text Interpretation: Sinus rhythm Probable LVH with secondary repol abnrm No STEMI Confirmed by Alona Bene [phone]) on 07/22/2020 2:08:12 PM       ____________________________________________  RADIOLOGY  CT Code Stroke CTA Head W/WO contrast  Result Date: 07/22/2020 CLINICAL DATA:  Code stroke.  Neuro deficit. EXAM: CT ANGIOGRAPHY HEAD AND NECK TECHNIQUE:  Multidetector CT imaging of the head and neck was performed using the standard protocol during bolus administration of intravenous contrast. Multiplanar CT image reconstructions and MIPs were obtained to evaluate the vascular anatomy. Carotid stenosis measurements (when applicable) are obtained utilizing NASCET criteria, using the distal internal carotid diameter as the denominator. CONTRAST:  63mL OMNIPAQUE IOHEXOL 350 MG/ML SOLN COMPARISON:  07/22/2020 head CT and prior. 02/04/2015 CTA head and prior. FINDINGS: CTA NECK FINDINGS Aortic arch: Bovine arch anatomy. Imaged portion shows no evidence of aneurysm or dissection. No significant stenosis of the major arch vessel origins. Mild tortuosity of the proximal bilateral common carotid arteries. Right carotid system: No evidence of dissection, stenosis (50% or greater) or occlusion. Left carotid system: No evidence of dissection, stenosis (50% or greater) or occlusion. Vertebral arteries: Codominant. No evidence of dissection, stenosis (50% or greater) or occlusion. Mild tortuosity of the left V1 segment. Skeleton: No acute osseous abnormality.  Multilevel spondylosis. Other neck: No adenopathy.  No soft tissue mass. Upper chest: Biapical atelectasis.  Emphysema. Review of the MIP images confirms the above findings CTA HEAD FINDINGS Sequela of right pterional craniotomy with underlying frontotemporal encephalomalacia. No intracranial hemorrhage or acute infarct. No midline shift, mass lesion or extra-axial fluid collection.  No ventriculomegaly. Anterior circulation: Sequela of prior right MCA aneurysm clip ligation. No residual or new aneurysm. Azygos ACA. No significant stenosis, proximal occlusion, or vascular malformation. Bilateral ICA cervical segment tortuosity. Posterior circulation: No significant stenosis, proximal occlusion, aneurysm, or vascular malformation. Mild basilar artery and V4 segment tortuosity. Codominant vertebral arteries. Venous sinuses: No  evidence of thrombosis. Anatomic variants: Bilateral PCOM hypoplasia. Review of the MIP images confirms the above findings IMPRESSION: 1. No acute intracranial process. 2. No large vessel occlusion, high-grade stenosis, or evidence of dissection. 3. Sequela of prior right MCA aneurysm clipping. No residual or new aneurysm. 4. Azygos ACA. 5. Emphysema. Electronically Signed   By: Stana Bunting M.D.   On: 07/22/2020 14:20   CT Code Stroke CTA Neck W/WO contrast  Result Date: 07/22/2020 CLINICAL DATA:  Code stroke.  Neuro deficit. EXAM: CT ANGIOGRAPHY HEAD AND NECK TECHNIQUE: Multidetector CT imaging of the head and neck was performed using the standard protocol during bolus administration of intravenous contrast. Multiplanar CT image reconstructions and MIPs were obtained to evaluate the vascular anatomy. Carotid stenosis measurements (when applicable) are obtained utilizing NASCET criteria, using the distal internal carotid diameter as the denominator. CONTRAST:  21mL OMNIPAQUE IOHEXOL 350 MG/ML SOLN COMPARISON:  07/22/2020 head CT and prior. 02/04/2015 CTA head and prior. FINDINGS: CTA NECK FINDINGS Aortic arch: Bovine arch anatomy. Imaged portion shows no evidence of aneurysm or dissection. No significant stenosis of the major arch vessel origins. Mild tortuosity of the proximal bilateral common carotid arteries. Right carotid system: No evidence of dissection, stenosis (50% or greater) or occlusion. Left carotid system: No evidence of dissection, stenosis (50% or greater) or occlusion. Vertebral arteries: Codominant. No evidence of dissection, stenosis (50% or greater) or occlusion. Mild tortuosity of the left V1 segment. Skeleton: No acute osseous abnormality.  Multilevel spondylosis. Other neck: No adenopathy.  No soft tissue mass. Upper chest: Biapical atelectasis.  Emphysema. Review of the MIP images confirms the above findings CTA HEAD FINDINGS Sequela of right pterional craniotomy with underlying  frontotemporal encephalomalacia. No intracranial hemorrhage or acute infarct. No midline shift, mass lesion or extra-axial fluid collection. No ventriculomegaly. Anterior circulation: Sequela of prior right MCA aneurysm clip ligation. No residual or new aneurysm. Azygos ACA. No significant stenosis, proximal occlusion, or vascular malformation. Bilateral ICA cervical segment tortuosity. Posterior circulation: No significant stenosis, proximal occlusion, aneurysm, or vascular malformation. Mild basilar artery and V4 segment tortuosity. Codominant vertebral arteries. Venous sinuses: No evidence of thrombosis. Anatomic variants: Bilateral PCOM hypoplasia. Review of the MIP images confirms the above findings IMPRESSION: 1. No acute intracranial process. 2. No large vessel occlusion, high-grade stenosis, or evidence of dissection. 3. Sequela of prior right MCA aneurysm clipping. No residual or new aneurysm. 4. Azygos ACA. 5. Emphysema. Electronically Signed   By: Stana Bunting M.D.   On: 07/22/2020 14:20   DG Chest Portable 1 View  Result Date: 07/22/2020 CLINICAL DATA:  Chest pain, LEFT jaw pain and back pain since 0900 hours this morning EXAM: PORTABLE CHEST 1 VIEW COMPARISON:  Portable exam 1427 hours compared to 08/31/2016 FINDINGS: Upper normal heart size. Mediastinal contours and pulmonary vascularity normal. Atherosclerotic calcification aorta. Lungs clear. No pulmonary infiltrate, pleural effusion or pneumothorax. Osseous structures unremarkable. IMPRESSION: No acute abnormalities. Aortic Atherosclerosis (ICD10-I70.0). Electronically Signed   By: Ulyses Southward M.D.   On: 07/22/2020 14:36   XR Shoulder Right  Result Date: 07/22/2020 Mild AC joint arthrosis.  No acute abnormalities.  CT HEAD CODE STROKE WO CONTRAST  Result Date: 07/22/2020 CLINICAL DATA:  Code stroke. Acute neuro deficit with left-sided weakness. History of cerebral aneurysm repair EXAM: CT HEAD WITHOUT CONTRAST TECHNIQUE: Contiguous  axial images were obtained from the base of the skull through the vertex without intravenous contrast. COMPARISON:  CT head 08/11/2015 FINDINGS: Brain: 2 aneurysm clips in the right MCA region unchanged from the prior study. There is encephalomalacia in the right temporal lobe and right operculum, unchanged. Ventricle size normal. Negative for acute infarct, hemorrhage, mass. Vascular: Negative for hyperdense vessel. Aneurysm clips in the right MCA region. Skull: Right pterional craniotomy.  No acute skeletal abnormality. Sinuses/Orbits: Mild mucosal edema throughout the paranasal sinuses. Negative orbit Other: None ASPECTS (Alberta Stroke Program Early CT Score) - Ganglionic level infarction (caudate, lentiform nuclei, internal capsule, insula, M1-M3 cortex): 7 - Supraganglionic infarction (M4-M6 cortex): 3 Total score (0-10 with 10 being normal): 10 IMPRESSION: 1. No acute abnormality 2. ASPECTS is 10 3. Aneurysm clipping right MCA territory with surrounding encephalomalacia stable since the prior CT. 4. Preliminary report texted to Dr. Amada Jupiter Electronically Signed   By: Marlan Palau M.D.   On: 07/22/2020 13:51    ____________________________________________   PROCEDURES  Procedure(s) performed:   Procedures  None  ____________________________________________   INITIAL IMPRESSION / ASSESSMENT AND PLAN / ED COURSE  Pertinent labs & imaging results that were available during my care of the patient were reviewed by me and considered in my medical decision making (see chart for details).   Patient arrives as a code stroke by EMS.  Symptoms have mostly resolved with some residual subjective left leg weakness.  Equal strength and sensation on exam.  Neurology evaluated the patient immediately upon arrival.  Patient was taken over for CT imaging of the head along with CTA of the head and neck.  No acute findings there.  Symptoms are somewhat atypical for acute stroke.  Considering atypical ACS  presentation as well.  EKG with no acute ischemic findings.  I have added troponins and will trend these enzymes.  Following neurology note for additional recommendations.   CT head and CTA reviewed. Care transferred to Dr. Rubin Payor pending second troponin and neuro follow up with final recommendations.  ____________________________________________  FINAL CLINICAL IMPRESSION(S) / ED DIAGNOSES  Final diagnoses:  Weakness   MEDICATIONS GIVEN DURING THIS VISIT:  Medications  iohexol (OMNIPAQUE) 350 MG/ML injection 75 mL (75 mLs Intravenous Contrast Given 07/22/20 1352)    Note:  This document was prepared using Dragon voice recognition software and may include unintentional dictation errors.  Alona Bene, MD, [facility] Emergency Medicine    Neera Teng, Arlyss Repress, MD 07/23/20 [phone]

## 2020-07-22 NOTE — ED Notes (Signed)
AVS reviewed with patient and patient daughter. PT ambulatory out of dept.

## 2020-07-22 NOTE — Code Documentation (Signed)
Stroke Response Nurse Documentation Code Documentation  Renee Murray is a 64 y.o. female arriving to [facility] ED via [facility] on 8/5 with past medical hx of SAH, HTN, and diabetes. Code stroke was activated by EMS. Patient from Urgent Care where she was LKW at 0900 and now complaining of left sided weakness. Pt reports waking up this morning at her baseline. At 0900, she was sitting on the couch when she had a sudden onset of jaw pain that radiated down into her left arm with weakness. Pt had a 1015 Ortho appt and reported the symptoms got better, but never resolved. Called primary care at 1230 and they advised patient to go to urgent care or ED. Urgent care assessed patient and activated a Code Stroke. Stroke team at the bedside on patient arrival. Labs drawn and patient cleared for CT by Dr. Jacqulyn Bath. Patient to CT with team. NIHSS 1, see documentation for details and code stroke times. Patient with left arm weakness on exam. The following imaging was completed:  CT, CTA head and neck. Patient is not a candidate for tPA due to being outside Care/Plan: q2 mNIHSS/VS. Bedside handoff with ED RN Leeroy Bock.    Lucila Maine  Stroke Response RN

## 2020-07-22 NOTE — Telephone Encounter (Signed)
Noted  

## 2020-07-22 NOTE — Progress Notes (Signed)
Office Visit Note   Patient: Renee Murray           Date of Birth: Apr 29, 1956           MRN: [id number] Visit Date: 07/22/2020              Requested by: Renee Register, MD [address] [place],  [place] [address] PCP: Renee Saupe, MD   Assessment & Plan: Visit Diagnoses:  1. Chronic right shoulder pain     Plan: Impression is right cubital tunnel syndrome.  My sense is that her shoulder pain is referred from the elbow.  We will obtain nerve conduction studies to evaluate for this.  Elbow compression sleeve applied today.  Follow-up after the nerve conduction studies.  Follow-Up Instructions: Return if symptoms worsen or fail to improve.   Orders:  Orders Placed This Encounter  Procedures  . XR Shoulder Right   No orders of the defined types were placed in this encounter.     Procedures: No procedures performed   Clinical Data: No additional findings.   Subjective: Chief Complaint  Patient presents with  . Right Shoulder - Pain  . Right Arm - Pain    Renee Murray is a 64 year old female referral from PCP for chronic right shoulder and elbow pain.  She has numbness and tingling throughout the hand is worse in the ring and small finger with radiation up the medial arm into the shoulder.  She is right-hand dominant.  Currently taking gabapentin Robaxin and ibuprofen.  She has been having increased pain in her shoulder causing difficulty with sleeping.  Denies any injuries.   Review of Systems  Constitutional: Negative.   HENT: Negative.   Eyes: Negative.   Respiratory: Negative.   Cardiovascular: Negative.   Endocrine: Negative.   Musculoskeletal: Negative.   Neurological: Negative.   Hematological: Negative.   Psychiatric/Behavioral: Negative.   All other systems reviewed and are negative.    Objective: Vital Signs: Ht 4' 9.5" (1.461 m)   Wt 144 lb (65.3 kg)   BMI 30.62 kg/m   Physical Exam Vitals and nursing note reviewed.  Constitutional:        Appearance: She is well-developed.  HENT:     Head: Normocephalic and atraumatic.  Pulmonary:     Effort: Pulmonary effort is normal.  Abdominal:     Palpations: Abdomen is soft.  Musculoskeletal:     Cervical back: Neck supple.  Skin:    General: Skin is warm.     Capillary Refill: Capillary refill takes less than 2 seconds.  Neurological:     Mental Status: She is alert and oriented to person, place, and time.  Psychiatric:        Behavior: Behavior normal.        Thought Content: Thought content normal.        Judgment: Judgment normal.     Ortho Exam Right elbow shows a stable ulnar nerve.  Positive Tinel at the cubital tunnel.  No hand intrinsic muscle atrophy.  Negative Wartenberg's sign.  Range of motion of the elbow is normal.  Range of motion of the shoulder does not produce any pain.  Manual muscle testing is grossly normal. Specialty Comments:  No specialty comments available.  Imaging: XR Shoulder Right  Result Date: 07/22/2020 Mild AC joint arthrosis.  No acute abnormalities.    PMFS History: Patient Active Problem List   Diagnosis Date Noted  . Acute midline low back pain 12/27/2016  . Acute pain  of both shoulders 12/27/2016  . Rotator cuff impingement syndrome 12/27/2016  . Essential hypertension 01/25/2016  . Chronic pain syndrome 01/25/2016  . Prediabetes 10/13/2015  . Anxiety about health 09/13/2015  . Neuropathy 09/13/2015  . Chronic generalized pain 09/13/2015  . History of seasonal allergies 09/13/2015  . SAH (subarachnoid hemorrhage) (HCC)   . Subarachnoid hemorrhage (HCC) 02/04/2015  . History of sinus disease on CT 08/10/2014  . Hypertension, goal below 150/90 03/17/2014  . Health care maintenance 03/17/2014  . Tobacco abuse 03/17/2014  . Neuropathy, upper extremity bilaterally  08/06/2013   Past Medical History:  Diagnosis Date  . Brain aneurysm   . Diabetes mellitus without complication (HCC)   . Hypertension     Family History   Problem Relation Age of Onset  . Diabetes Mother   . Heart failure Mother   . Cancer Father     Past Surgical History:  Procedure Laterality Date  . ABDOMINAL HYSTERECTOMY    . CHOLECYSTECTOMY    . CRANIOTOMY Right 02/04/2015   Procedure: RIGHT CRANIOTOMY FOR ANEURYSM ;  Surgeon: Renee Renshaw, MD;  Location: [facility];  Service: Neurosurgery;  Laterality: Right;  . IR GENERIC HISTORICAL  02/04/2015   IR ANGIO INTRA EXTRACRAN SEL COM CAROTID INNOMINATE BILAT MOD SED 02/04/2015 Renee Renshaw, MD [facility]  . IR GENERIC HISTORICAL  02/04/2015   IR ANGIO VERTEBRAL SEL SUBCLAVIAN INNOMINATE UNI R MOD SED 02/04/2015 Renee Renshaw, MD [facility]  . IR GENERIC HISTORICAL  02/04/2015   IR ANGIO VERTEBRAL SEL VERTEBRAL UNI L MOD SED 02/04/2015 Renee Renshaw, MD [facility]   Social History   Occupational History  . Not on file  Tobacco Use  . Smoking status: Current Every Day Smoker    Packs/day: 0.00    Years: 20.00    Pack years: 0.00    Types: Cigarettes  . Smokeless tobacco: Never Used  . Tobacco comment: reports 1 pack per month  Vaping Use  . Vaping Use: Never used  Substance and Sexual Activity  . Alcohol use: Yes    Comment: occasionally  . Drug use: No  . Sexual activity: Not Currently

## 2020-07-22 NOTE — ED Provider Notes (Signed)
  Physical Exam  BP (!) 138/58 (BP Location: Right Arm)   Pulse (!) 59   Temp 98.1 F (36.7 C) (Oral)   Resp 15   Wt 66.5 kg   SpO2 99%   BMI 31.18 kg/m   Physical Exam  ED Course/Procedures     Procedures  MDM  Patient presented with a troponin negative x2.  Head CT and CTA reassuring.  Has been seen by neurology.  Will need likely EEG for them but can be done as an outpatient.  Patient eager to go at home and will be discharged       Benjiman Core, MD 07/22/20 2223

## 2020-07-22 NOTE — ED Notes (Signed)
Called [facility] to activate the stroke team

## 2020-07-22 NOTE — Consult Note (Addendum)
Neurology Consultation Reason for Consult: left sided weakness  Referring Physician: Alona Bene  CC: Left jaw pain, arm pain and left arm weakness  History is obtained from: Patient and chart review  HPI: Renee Murray is a 64 y.o. female with a past medical history of right MCA aneurysm complicated by subarachnoid hemorrhage status post clipping (2016), hypertension, diabetes (A1c 5.7%, previously higher but diet controlled), and suspected ulnar nerve entrapment at the elbow.  She reports that at 9 AM she felt a electric pain in her left jaw which quickly radiated down her neck into her arm lasting a few seconds at a very intense level and then gradually abating over the course of minutes to hours.  This was associated with weakness of the left arm which concurrently gradually abated.  She reports that she has been having multiple of these episodes in the past year and that they are all exactly stereotyped.  She feels that they have been increasing in frequency though it is hard for her to pin down exactly how frequently to happen, she does report that her last similar episode happened within the last week.  Due to the increasing frequency she was concerned about whether these events might represent TIAs and therefore called EMS for evaluation.  She feels that they might be provoked by teeth clenching which is she has noticed she does unconsciously however there is not a 100% correlation and she feels that some of these episodes happen out of rest.  She does not have any associated shaking or tremors.  Review of systems she notes that she has some ulnar neuropathy symptoms, but no fevers, no other weakness, no bowel or bladder incontinence, no unexplained loss of consciousness, no olfactory hallucinations.  She has not had any severe headaches, palpitations, chest pain, difficulty breathing, GI or GU symptoms including incontinence or bloody urine or stool, rashes, joint swelling other than her  right upper extremity,  Past Medical History:  Diagnosis Date  . Brain aneurysm   . Diabetes mellitus without complication (HCC)   . Hypertension     Family History  Problem Relation Age of Onset  . Diabetes Mother   . Heart failure Mother   . Cancer Father    Social History:  reports that she has been smoking cigarettes. She has been smoking about 0.00 packs per day for the past 20.00 years. She has never used smokeless tobacco. She reports current alcohol use. She reports that she does not use drugs.  She reports that her daughter is the primary caregiver for her husband who has recently been diagnosed with cancer.  She lives independently but her daughter is also helping to take care of her since her brain aneurysm and subarachnoid hemorrhage.   Exam: Current vital signs: BP (!) 138/58 (BP Location: Right Arm)   Pulse (!) 59   Temp 98.1 F (36.7 C) (Oral)   Resp 15   Wt 66.5 kg   SpO2 99%   BMI 31.18 kg/m  Vital signs in last 24 hours: Temp:  [98.1 F (36.7 C)-98.3 F (36.8 C)] 98.1 F (36.7 C) (08/05 1816) Pulse Rate:  [54-70] 59 (08/05 1816) Resp:  [15-18] 15 (08/05 1816) BP: (131-156)/(48-69) 138/58 (08/05 1816) SpO2:  [96 %-99 %] 99 % (08/05 1816) Weight:  [65.3 kg-66.5 kg] 66.5 kg (08/05 1403)   Physical Exam  Constitutional: Appears well-developed and well-nourished.  Psych: Affect appropriate to situation Eyes: No scleral injection HENT: No OP obstrucion.  She does not  have any neck tenderness to palpation of the cervical spine, nor does she have pain with neck movement MSK: no joint deformities.  Cardiovascular: Normal rate and regular rhythm.  Respiratory: Effort normal, non-labored breathing GI: Soft.  No distension. There is no tenderness.  Skin: WDI  Neuro: Mental Status: Patient is awake, alert, oriented to person, place, month, year, and situation. Patient is able to give a clear and coherent history. No signs of aphasia or neglect Cranial  Nerves: II: Visual Fields are full. Pupils are equal, round, and reactive to light.   III,IV, VI: EOMI without ptosis or diploplia.  V: Facial sensation is symmetric to temperature VII: Facial movement is symmetric.  VIII: hearing is intact to voice X: Uvula elevates symmetrically XI: Shoulder shrug is symmetric. XII: tongue is midline without atrophy or fasciculations.  Motor: Tone is normal. Bulk is normal. 5/5 strength was present in all four extremities, mildly pain limited in the left upper extremity (left shoulder pain) and left hip flexor (knee pain) Sensory: Sensation is symmetric to light touch and temperature in the arms and legs. Deep Tendon Reflexes: 2+ and symmetric in the biceps and patellae.  Plantars: Toes are downgoing bilaterally.  Cerebellar: FNF and HKS are intact bilaterally  Her NIH stroke scale was 0   I have reviewed labs in epic and the results pertinent to this consultation are: Creatinine of 0.72 Head CT without any acute intracranial process CTA demonstrating prior right aneurysm clipping without any significant new aneurysms or occlusion I have reviewed the images obtained personally  Impression: [name] is a 64 year old woman with stereotyped episodes of left jaw pain and left arm pain associated with weakness.  Her examination is reassuring.  The description of her events is concerning for possible focal seizures given the premonition she feels at first, the evolution over time and the gradual resolution subsequent to the initial event.  However the lack of rhythmic shaking is somewhat atypical.  I additionally considered a cervical cause for her symptoms such as neuroforaminal disease, however she did not have any neck pain or pain with neck movement, which makes this fairly unlikely.  Additionally given that they are not well correlated with movement, spine imaging is not indictated at this time.  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

## 2020-07-22 NOTE — ED Notes (Signed)
O2 via [place] at 2L placed

## 2020-07-22 NOTE — ED Triage Notes (Signed)
Pt c/o left jaw pain that radiated to left neck/arm and back this morning onset at approx 0900 with left arm weakness that pt states she could not use her left arm 2/2 weak. Pt states arm weakness lasted approx 2-3 minutes; but continues to have left jaw pain.  Denies CP, SOB, diaphoresis, leg weakness, dizziness, n/v.  PERRLA, smile symmetrical, left hand grip slightly weaker than right, left leg slightly weaker than rightS. Ashley Royalty, NP notified of pt status, in for eval. Per Nile Riggs, pt to go to ED via EMS to r/o CVA.

## 2020-07-22 NOTE — ED Notes (Addendum)
Pt refused lab draw...she wants RN to pull labs from IV RN aware

## 2020-07-22 NOTE — ED Triage Notes (Signed)
Pt presents to ED as code stroke from urgent care as code stroke d/t pain and weakness that starts at left jaw and radiates down left neck, left arm, and left leg. PT speech clear, no facial droop, A&Ox4.

## 2020-07-23 ENCOUNTER — Other Ambulatory Visit: Payer: Medicare Other

## 2020-07-23 ENCOUNTER — Telehealth: Payer: Self-pay | Admitting: Orthopaedic Surgery

## 2020-07-23 MED ORDER — PREDNISONE 10 MG (21) PO TBPK: ORAL_TABLET | ORAL | 0 refills | Status: DC

## 2020-07-23 NOTE — Telephone Encounter (Signed)
Pt called stating she had an appt yesterday and was told something would be sent in and she just wanted to make sure Dr.Xu was [facility] the [facility] on [address]  [phone]

## 2020-07-26 ENCOUNTER — Other Ambulatory Visit: Payer: Self-pay

## 2020-07-26 ENCOUNTER — Telehealth: Payer: Self-pay | Admitting: Family Medicine

## 2020-07-26 DIAGNOSIS — I10 Essential (primary) hypertension: Secondary | ICD-10-CM

## 2020-07-26 DIAGNOSIS — E119 Type 2 diabetes mellitus without complications: Secondary | ICD-10-CM

## 2020-07-26 MED ORDER — ONETOUCH DELICA LANCETS 33G MISC: 11 refills | Status: DC

## 2020-07-26 MED ORDER — ONETOUCH VERIO W/DEVICE KIT: PACK | 0 refills | Status: DC

## 2020-07-26 NOTE — Telephone Encounter (Signed)
Pt called stating that she also does not have a meter. She is requesting to have a Full kit sent in for her again. She states that she believes the kit was thrown away. Please advise.      [address], [place] [address]  Phone: [phone] Fax: [phone]  Hours: Not open 24 hours

## 2020-07-26 NOTE — Telephone Encounter (Signed)
Copied from [place] [id number]. Topic: Quick Communication - Rx Refill/Question >> Jul 26, 2020  9:04 AM Leward Quan A wrote: Medication: Renee Murray Lancet pen device One that came in the kit broke need a new one please    Has the patient contacted their pharmacy? Yes.   (Agent: If no, request that the patient contact the pharmacy for the refill.) (Agent: If yes, when and what did the pharmacy advise?)  Preferred Pharmacy (with phone number or street name): [facility], [address]  Phone:  [phone] Fax:  [phone]     Agent: Please be advised that RX refills may take up to 3 business days. We ask that you follow-up with your pharmacy.

## 2020-07-26 NOTE — Patient Outreach (Signed)
Aging Gracefully Program  07/26/2020  GAYL IVANOFF 1956-03-15 [id number]   Follow up call: AG visits completed. This was a follow up call to patient about hr wrist/elbow and shoulder.  Patient reports seeing ortho and was RX prednisone.  Patient reports recent visit to the ED for ? [place] that was ruled out.   Reports feeling better and doing her home exercises.   PLAN: AG nursing is complete.  Will send message to OT.  [name], RN, BSN, CEN [facility] [employer] [phone]

## 2020-07-26 NOTE — Telephone Encounter (Signed)
It looks like the Blood Glucose Monitoring Suppl (ONETOUCH VERIO) w/Device KIT Was sent today 07/26/20 to [facility] [place]

## 2020-07-27 NOTE — Telephone Encounter (Signed)
Patient called in checking on the status of new script. LVM with patient advising her [facility] Control and instrumentation engineer) confirmed rx was received and directions reflect, ready for pick up at pharmacy/

## 2020-07-29 DIAGNOSIS — Z20822 Contact with and (suspected) exposure to covid-19: Secondary | ICD-10-CM | POA: Diagnosis not present

## 2020-08-02 NOTE — Telephone Encounter (Signed)
Pt calling back to advise [facility] has advised her they are waiting on the dr office to send in correct code for her to get her test strips.  Pt is about to run out and needs this info sent to the pharmacy

## 2020-08-03 ENCOUNTER — Other Ambulatory Visit: Payer: Medicare Other | Admitting: Occupational Therapy

## 2020-08-03 ENCOUNTER — Other Ambulatory Visit: Payer: Self-pay | Admitting: Pharmacist

## 2020-08-03 DIAGNOSIS — E119 Type 2 diabetes mellitus without complications: Secondary | ICD-10-CM

## 2020-08-03 DIAGNOSIS — J309 Allergic rhinitis, unspecified: Secondary | ICD-10-CM

## 2020-08-03 DIAGNOSIS — I10 Essential (primary) hypertension: Secondary | ICD-10-CM

## 2020-08-03 DIAGNOSIS — G5621 Lesion of ulnar nerve, right upper limb: Secondary | ICD-10-CM

## 2020-08-03 MED ORDER — ONETOUCH VERIO W/DEVICE KIT: PACK | 0 refills | Status: DC

## 2020-08-03 MED ORDER — AMLODIPINE BESYLATE 10 MG PO TABS: 10.0000 mg | ORAL_TABLET | Freq: Every day | ORAL | 1 refills | Status: DC

## 2020-08-03 MED ORDER — GABAPENTIN 300 MG PO CAPS: 300.0000 mg | ORAL_CAPSULE | Freq: Three times a day (TID) | ORAL | 1 refills | Status: DC

## 2020-08-03 MED ORDER — CETIRIZINE HCL 10 MG PO TABS: 10.0000 mg | ORAL_TABLET | Freq: Every day | ORAL | 1 refills | Status: DC

## 2020-08-03 MED ORDER — CITALOPRAM HYDROBROMIDE 10 MG PO TABS: 10.0000 mg | ORAL_TABLET | Freq: Every day | ORAL | 1 refills | Status: DC

## 2020-08-03 MED ORDER — ONETOUCH DELICA PLUS LANCING MISC: 6 refills | Status: DC

## 2020-08-03 MED ORDER — ONETOUCH VERIO VI STRP: ORAL_STRIP | 6 refills | Status: DC

## 2020-08-03 MED ORDER — ONETOUCH DELICA LANCETS 33G MISC: 11 refills | Status: DC

## 2020-08-03 NOTE — Addendum Note (Signed)
Addended by: Lois Huxley, Jeannett Senior L on: 08/03/2020 05:40 PM   Modules accepted: Orders

## 2020-08-03 NOTE — Telephone Encounter (Signed)
Rx sent 

## 2020-08-04 NOTE — Patient Outreach (Signed)
Aging Gracefully Program  OT FINAL Visit  08/04/2020  Renee Murray July 18, 1956 [id number]  Visit:  3- Third Visit  Start Time:  1100 End Time:  1230 Total Minutes:  90   Readiness to Change:    Home Environment Assessment: Improved and safe from initial eval    Durable Medical Equipment: Adaptive Equipment:  (hand held shower head; step up stool to tall bed) Adaptive Equipment Distribution Date: 08/03/20  Patient Education: Education Provided: Yes Education Details: educated pt on safe use of step stool up to tall bed for improved mobility in and out of bed. Issued pt HH shower head to be placed by [facility] Person(s) Educated: Patient Comprehension: Verbalized Understanding, Returned Demonstration  Goals: Goals Addressed            This Visit's Progress   . COMPLETED: Patient Stated       Increase safety and independence getting in and out of bed.  03/10/2020  Reviewed with patient concern for the height of her bed. Patient states she does not use a step stool.    8/17- step stool issued and pt practiced with success    . COMPLETED: Patient Stated       Increase safety entering and exiting home.  03/10/2020  Reviewed goal with patient. Patient is concerned about loose banister at the front entrance of her home.  08/03/20- banister fixed and pt much more confident       Post Clinical Reasoning: Client Action (Goal) Two Interventions: Increase safety getting in/out of bed with step stool due to bed being high Did Client Try?: Yes Targeted Problem Area Status: A Lot Better Client Action (Goal) Three Interventions: Increase safety in and out of house- home rail replaced by CHS. Much more sturdy and pt able to safety get in and out safely and efficiently Did Client Try?: Yes Targeted Problem Area Status: A Lot Better Clinician View Of Client Situation:: Equipment delivered to pt with education and safe recommendations. Despite additional fall education pt continues to  have throw rugs around home. Client View Of His/Her Situation:: Pt is understanding of education, but prefers to do things her way     Dalphine Handing, MSOT, OTR/L [facility] Office Number: [phone] Pager: [phone]

## 2020-08-06 ENCOUNTER — Other Ambulatory Visit: Payer: Self-pay | Admitting: Pharmacist

## 2020-08-06 DIAGNOSIS — E119 Type 2 diabetes mellitus without complications: Secondary | ICD-10-CM

## 2020-08-06 DIAGNOSIS — I10 Essential (primary) hypertension: Secondary | ICD-10-CM

## 2020-08-06 MED ORDER — ONETOUCH DELICA PLUS LANCING MISC: 6 refills | Status: DC

## 2020-08-06 MED ORDER — ONETOUCH VERIO W/DEVICE KIT: PACK | 0 refills | Status: DC

## 2020-08-06 MED ORDER — ONETOUCH VERIO VI STRP: ORAL_STRIP | 6 refills | Status: DC

## 2020-08-09 ENCOUNTER — Other Ambulatory Visit: Payer: Self-pay

## 2020-08-09 NOTE — Patient Outreach (Signed)
Aging Gracefully Program  08/09/2020  Renee Murray 1956-05-05 [id number]   [facility] Evaluation Interviewer attempted to call patient today regarding Aging Gracefully referral. Left confidential message on voicemail  Will attempt to call back within 1 week.   [facility] Management Assistant [phone]

## 2020-08-24 ENCOUNTER — Other Ambulatory Visit: Payer: Medicare Other

## 2020-09-07 ENCOUNTER — Other Ambulatory Visit: Payer: Self-pay

## 2020-09-07 NOTE — Patient Outreach (Signed)
Aging Gracefully Program  09/07/2020  SUAN PYEATT 1956/05/09 [id number]   Called patient this morning to complete AG evaluation for 5 months follow up as scheduled. Called on the home number listed in the chart and patient's daughter picked up stating that she has patient's phone today. I was provided an alternative number to reach patient which is [phone]. I called alternative number x 3 and it kept going to voicemail, voicemail box was full so I could not leave a message. I called back the primary number but there was no answer this time but I was able to leave a message letting patient know that I attempted to reach her today to complete phone evaluation and asked patient to call me back to reschedule.   Domingo Cocking [facility] Care Management Assistant [phone]

## 2020-09-08 ENCOUNTER — Other Ambulatory Visit: Payer: Self-pay | Admitting: Physician Assistant

## 2020-09-08 ENCOUNTER — Telehealth: Payer: Self-pay | Admitting: Orthopaedic Surgery

## 2020-09-08 ENCOUNTER — Other Ambulatory Visit: Payer: Self-pay

## 2020-09-08 NOTE — Telephone Encounter (Signed)
Called patient no answer. Could not leave VM. We need to know what pain med she can take. See message below.

## 2020-09-08 NOTE — Patient Outreach (Signed)
Aging Gracefully Program  09/08/2020  Renee Murray 08-28-1956 [id number]   Spoke with patient today and completed AG 5 months/second evaluation survey with patient.  Domingo Cocking [facility] Care Management Assistant [phone]

## 2020-09-08 NOTE — Telephone Encounter (Signed)
Looks like she has some pretty serious allergies to most pain meds.  Can you call and see what she can take that she is not allergic to?

## 2020-09-08 NOTE — Telephone Encounter (Signed)
Patient called. She would like some pain medication sent to [facility] - [place], [place] - [address] You can reach her at [phone]

## 2020-09-10 ENCOUNTER — Telehealth: Payer: Self-pay

## 2020-09-10 ENCOUNTER — Other Ambulatory Visit: Payer: Self-pay | Admitting: Physician Assistant

## 2020-09-10 ENCOUNTER — Encounter: Payer: Medicare Other | Admitting: Physical Medicine and Rehabilitation

## 2020-09-10 MED ORDER — TRAMADOL HCL 50 MG PO TABS: 50.0000 mg | ORAL_TABLET | Freq: Two times a day (BID) | ORAL | 0 refills | Status: DC

## 2020-09-10 NOTE — Telephone Encounter (Signed)
Patient came in she is still requesting pain meds, she has been taking 800 ML ibuprofen which isn't helping she said the pain is getting worse. Patient stated she can take anything besides morphine call back:[phone] Pharmacy:[facility] - [place], [place] - [address]

## 2020-09-10 NOTE — Telephone Encounter (Signed)
Ok, I just called in tramadol

## 2020-09-10 NOTE — Telephone Encounter (Signed)
Please advise 

## 2020-09-14 DIAGNOSIS — Z23 Encounter for immunization: Secondary | ICD-10-CM | POA: Diagnosis not present

## 2020-09-22 ENCOUNTER — Encounter: Payer: Self-pay | Admitting: Neurology

## 2020-09-22 ENCOUNTER — Ambulatory Visit (INDEPENDENT_AMBULATORY_CARE_PROVIDER_SITE_OTHER): Payer: Medicare Other | Admitting: Neurology

## 2020-09-22 ENCOUNTER — Telehealth: Payer: Self-pay | Admitting: Neurology

## 2020-09-22 VITALS — BP 114/64 | HR 63 | Ht <= 58 in | Wt 143.0 lb

## 2020-09-22 DIAGNOSIS — G5601 Carpal tunnel syndrome, right upper limb: Secondary | ICD-10-CM | POA: Diagnosis not present

## 2020-09-22 DIAGNOSIS — G5622 Lesion of ulnar nerve, left upper limb: Secondary | ICD-10-CM | POA: Diagnosis not present

## 2020-09-22 DIAGNOSIS — R202 Paresthesia of skin: Secondary | ICD-10-CM | POA: Diagnosis not present

## 2020-09-22 MED ORDER — TOPIRAMATE 25 MG PO TABS: 25.0000 mg | ORAL_TABLET | Freq: Two times a day (BID) | ORAL | 2 refills | Status: DC

## 2020-09-22 NOTE — Patient Instructions (Signed)
I had a long discussion with the patient regarding symptoms of intermittent pain and paresthesias in her upper extremities likely representing neuropathic pain from carpal tunnel syndrome with and without ulnar nerve entrapment as well.  I recommend she wear right wrist extension splint at all times and avoid activities which involve rapid repetitive flexion movements.  Add Topamax 25 mg twice daily to help with neuropathic pain.  Check EMG nerve conduction studies as well as MRI scan of the brain and cervical spine.  She will return for follow-up in the future in 2 months or call earlier if necessary.  Carpal Tunnel Syndrome  Carpal tunnel syndrome is a condition that causes pain in your hand and arm. The carpal tunnel is a narrow area that is on the palm side of your wrist. Repeated wrist motion or certain diseases may cause swelling in the tunnel. This swelling can pinch the main nerve in the wrist (median nerve). What are the causes? This condition may be caused by:  Repeated wrist motions.  Wrist injuries.  Arthritis.  A sac of fluid (cyst) or abnormal growth (tumor) in the carpal tunnel.  Fluid buildup during pregnancy. Sometimes the cause is not known. What increases the risk? The following factors may make you more likely to develop this condition:  Having a job in which you move your wrist in the same way many times. This includes jobs like being a Midwife or a Conservation officer, nature.  Being a woman.  Having other health conditions, such as: ? Diabetes. ? Obesity. ? A thyroid gland that is not active enough (hypothyroidism). ? Kidney failure. What are the signs or symptoms? Symptoms of this condition include:  A tingling feeling in your fingers.  Tingling or a loss of feeling (numbness) in your hand.  Pain in your entire arm. This pain may get worse when you bend your wrist and elbow for a long time.  Pain in your wrist that goes up your arm to your shoulder.  Pain that goes down  into your palm or fingers.  A weak feeling in your hands. You may find it hard to grab and hold items. You may feel worse at night. How is this diagnosed? This condition is diagnosed with a medical history and physical exam. You may also have tests, such as:  Electromyogram (EMG). This test checks the signals that the nerves send to the muscles.  Nerve conduction study. This test checks how well signals pass through your nerves.  Imaging tests, such as X-rays, ultrasound, and MRI. These tests check for what might be the cause of your condition. How is this treated? This condition may be treated with:  Lifestyle changes. You will be asked to stop or change the activity that caused your problem.  Doing exercise and activities that make bones and muscles stronger (physical therapy).  Learning how to use your hand again (occupational therapy).  Medicines for pain and swelling (inflammation). You may have injections in your wrist.  A wrist splint.  Surgery. Follow these instructions at home: If you have a splint:  Wear the splint as told by your doctor. Remove it only as told by your doctor.  Loosen the splint if your fingers: ? Tingle. ? Lose feeling (become numb). ? Turn cold and blue.  Keep the splint clean.  If the splint is not waterproof: ? Do not let it get wet. ? Cover it with a watertight covering when you take a bath or a shower. Managing pain, stiffness, and swelling  If told, put ice on the painful area: ? If you have a removable splint, remove it as told by your doctor. ? Put ice in a plastic bag. ? Place a towel between your skin and the bag. ? Leave the ice on for 20 minutes, 2-3 times per day. General instructions  Take over-the-counter and prescription medicines only as told by your doctor.  Rest your wrist from any activity that may cause pain. If needed, talk with your boss at work about changes that can help your wrist heal.  Do any exercises as  told by your doctor, physical therapist, or occupational therapist.  Keep all follow-up visits as told by your doctor. This is important. Contact a doctor if:  You have new symptoms.  Medicine does not help your pain.  Your symptoms get worse. Get help right away if:  You have very bad numbness or tingling in your wrist or hand. Summary  Carpal tunnel syndrome is a condition that causes pain in your hand and arm.  It is often caused by repeated wrist motions.  Lifestyle changes and medicines are used to treat this problem. Surgery may help in very bad cases.  Follow your doctor's instructions about wearing a splint, resting your wrist, keeping follow-up visits, and calling for help. This information is not intended to replace advice given to you by your health care provider. Make sure you discuss any questions you have with your health care provider. Document Revised: 04/12/2018 Document Reviewed: 04/12/2018 Elsevier Patient Education  2020 [employer].

## 2020-09-22 NOTE — Telephone Encounter (Signed)
medicare/cigna order sent to GI. GI will obtain the auth for Cigna and reach out to the patient to schedule.

## 2020-09-22 NOTE — Progress Notes (Signed)
[facility] [address] [place]. [place] [address] [phone]       OFFICE CONSULT NOTE  [name] Date of Birth:  [birth year] Medical Record Number:  [id number]   Referring MD: Davonna Belling Reason for Referral: Right upper extremity pain and weakness  HPI: Renee Murray is a 64 year old African-American lady seen today for initial office consultation visit.  History is obtained from the patient, review of electronic medical records and available pertinent imaging films in PACS.  She has past medical history of diabetes, hypertension, anterior communicating artery aneurysm s/p surgical clipping in 2016 by Dr. Kathyrn Sheriff..  She states over the last 4 months she has had intermittent numbness, tingling, pain and weakness in the right upper extremity mostly and occasionally the left upper extremity but to a lesser extent.  This can come on suddenly without provocation.  She is notices when she is stretching her neck or arm in certain positions.  Feeling is quite severe and discomfort.  This may last for minutes but usually she managed to stretch her arm in such a way that the feeling subsides.  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  She denies having EMG nerve conduction study done or been specifically diagnosed with carpal tunnel.  She is in the pain at times is mostly in the hands but can spread to the elbow and even beyond into her armpit.  This is mostly burning and sometimes tingling but pain can be severe at times.  The pain often wakes her up from sleep and she has trouble sleeping at night.  She has to be careful and sleep in a certain position to avoid pressure on her shoulder which may trigger pain.  She does have mild arthritis in the shoulders.  She does take gabapentin 300 mg 3 times daily which is not helping as much.  She is not had any x-rays of her neck on MRI  scan done.  She was seen in the ER on 07/22/2020 with sudden onset of electric pain in the left jaw which radiated down her neck into the left arm for a few seconds and then resolved.  She also noted some associated weakness in the arm which was also transient.  She was seen by Dr. Curly Shores neuro hospitalist.  Patient reported that she had multiple such episodes in the past.  CT angiogram of brain and neck were performed which showed sequelae of prior right MCA aneurysm clipping without any residual aneurysm.  She was advised outpatient neurological work-up for this.  She denies any injury to her shoulder or upper extremities.  She has never worn wrist extension splint for carpal tunnel.  She denies any significant neck injury, radicular pain, hand weakness, gait or balance problems.  ROS:   14 system review of systems is positive for pain, numbness, weakness, tingling, jaw pain, all other systems negative  PMH:  Past Medical History:  Diagnosis Date  . Brain aneurysm   . Diabetes mellitus without complication ([place])   . Hypertension   . Stroke [facility])    stroke like symptons/admitted 8/21    Social History:  Social History   Socioeconomic History  . Marital status: Divorced    Spouse name: Not on file  . Number of children: 3  . Years of education: Not on file  . Highest education level: High school graduate  Occupational History  .  Occupation: retired  Tobacco Use  . Smoking status: Current Some Day Smoker    Packs/day: 0.00    Years: 20.00    Pack years: 0.00    Types: Cigarettes  . Smokeless tobacco: Never Used  . Tobacco comment: reports 1 pack per month  Vaping Use  . Vaping Use: Never used  Substance and Sexual Activity  . Alcohol use: Yes    Comment: occasionally  . Drug use: No  . Sexual activity: Not Currently  Other Topics Concern  . Not on file  Social History Narrative   Lives alone   Right handed   Drinks 3-4 cups of caffeine daily   Social Determinants of  Health   Financial Resource Strain:   . Difficulty of Paying Living Expenses: Not on file  Food Insecurity:   . Worried About Charity fundraiser in the Last Year: Not on file  . Ran Out of Food in the Last Year: Not on file  Transportation Needs:   . Lack of Transportation (Medical): Not on file  . Lack of Transportation (Non-Medical): Not on file  Physical Activity:   . Days of Exercise per Week: Not on file  . Minutes of Exercise per Session: Not on file  Stress:   . Feeling of Stress : Not on file  Social Connections:   . Frequency of Communication with Friends and Family: Not on file  . Frequency of Social Gatherings with Friends and Family: Not on file  . Attends Religious Services: Not on file  . Active Member of Clubs or Organizations: Not on file  . Attends Archivist Meetings: Not on file  . Marital Status: Not on file  Intimate Partner Violence:   . Fear of Current or Ex-Partner: Not on file  . Emotionally Abused: Not on file  . Physically Abused: Not on file  . Sexually Abused: Not on file    Medications:   Current Outpatient Medications on File Prior to Visit  Medication Sig Dispense Refill  . amLODipine (NORVASC) 10 MG tablet Take 1 tablet (10 mg total) by mouth daily. To lower blood pressure 90 tablet 1  . Blood Glucose Monitoring Suppl (ONETOUCH VERIO) w/Device KIT Use to test blood sugar once daily. E11.9 1 kit 0  . cetirizine (ZYRTEC) 10 MG tablet Take 1 tablet (10 mg total) by mouth daily. 90 tablet 1  . citalopram (CELEXA) 10 MG tablet Take 1 tablet (10 mg total) by mouth daily. 90 tablet 1  . fluticasone (FLONASE) 50 MCG/ACT nasal spray Place 2 sprays into both nostrils daily as needed for allergies or rhinitis. 1 g 5  . gabapentin (NEURONTIN) 300 MG capsule Take 1 capsule (300 mg total) by mouth 3 (three) times daily. 270 capsule 1  . glucose blood (ONETOUCH VERIO) test strip Use to check blood sugar once daily. E11.9 100 each 6  . ibuprofen  (ADVIL) 200 MG tablet Take 200-800 mg by mouth every 6 (six) hours as needed (for pain or headaches).    [employer] [facility] DELICA PLUS LANCING) MISC Use to check blood sugar once daily. E11.9 1 each 6  . methocarbamol (ROBAXIN) 500 MG tablet Take 1 tablet (500 mg total) by mouth every 8 (eight) hours as needed for muscle spasms. (Patient taking differently: Take 500 mg by mouth 3 (three) times daily. ) 60 tablet 1  . OneTouch Delica Lancets 30Z MISC Test blood sugars twice daily. E11.9 100 each 11  . traMADol (ULTRAM) 50 MG  tablet Take 1-2 tablets (50-100 mg total) by mouth 2 (two) times daily. 30 tablet 0  . trolamine salicylate (ASPERCREME) 10 % cream Apply 1 application topically as needed for muscle pain (or sciatica).     . predniSONE (DELTASONE) 20 MG tablet Take 1 tablet (20 mg total) by mouth daily with breakfast. (Patient not taking: Reported on 07/22/2020) 5 tablet 0  . predniSONE (STERAPRED UNI-PAK 21 TAB) 10 MG (21) TBPK tablet Take as directed 21 tablet 0   No current facility-administered medications on file prior to visit.    Allergies:   Allergies  Allergen Reactions  . Demerol Other (See Comments)    "I almost died"  . Morphine And Related Other (See Comments)    "Almost Died"    Physical Exam General: Mildly obese middle-age African-American lady, seated, in no evident distress Head: head normocephalic and atraumatic.   Neck: supple with no carotid or supraclavicular bruits Cardiovascular: regular rate and rhythm, no murmurs Musculoskeletal: no deformity Skin:  no rash/petichiae Vascular:  Normal pulses all extremities  Neurologic Exam Mental Status: Awake and fully alert. Oriented to place and time. Recent and remote memory intact. Attention span, concentration and fund of knowledge appropriate. Mood and affect appropriate.  Cranial Nerves: Fundoscopic exam reveals sharp disc margins. Pupils equal, briskly reactive to light. Extraocular movements full without  nystagmus. Visual fields full to confrontation. Hearing intact. Facial sensation intact. Face, tongue, palate moves normally and symmetrically.  Motor: Normal bulk and tone. Normal strength in all tested extremity muscles. Sensory.: intact to touch , pinprick , position and vibratory sensation.  Positive Tinel sign over the right wrist.  Positive Tinel sign over left greater than right ulnar grooves. Coordination: Rapid alternating movements normal in all extremities. Finger-to-nose and heel-to-shin performed accurately bilaterally. Gait and Station: Arises from chair without difficulty. Stance is normal. Gait demonstrates normal stride length and balance . Able to heel, toe and tandem walk with with mild difficulty.  Reflexes: 1+ and symmetric. Toes downgoing.       ASSESSMENT: 64 year old African-American lady with chronic right upper extremity pain numbness and paresthesias likely from combination of carpal tunnel syndrome and possibly ulnar nerve entrapment.  Also history of recurrent transient left jaw and arm shooting pain of unclear etiology now resolved.  Remote history of right MCA intracranial aneurysm and subarachnoid hemorrhage status post craniotomy for aneurysm coiling in 2016 by Dr. Kathyrn Sheriff.     PLAN: I had a long discussion with the patient regarding symptoms of intermittent pain and paresthesias in her upper extremities likely representing neuropathic pain from carpal tunnel syndrome with and without ulnar nerve entrapment as well.  I recommend she wear right wrist extension splint at all times and avoid activities which involve rapid repetitive flexion movements.  Add Topamax 25 mg twice daily to help with neuropathic pain.  Check EMG nerve conduction studies as well as MRI scan of the brain and cervical spine.  Greater than 50% time during this 45-minute consultation visit was spent on counseling and coordination of care about her upper extremity paresthesias and discussion about  carpal tunnel and ulnar nerve entrapment and answering questions she will return for follow-up in the future in 2 months or call earlier if necessary. Antony Contras, MD Note: This document was prepared with digital dictation and possible smart phrase technology. Any transcriptional errors that result from this process are unintentional.

## 2020-09-29 ENCOUNTER — Encounter: Payer: Self-pay | Admitting: Physical Medicine and Rehabilitation

## 2020-09-29 ENCOUNTER — Ambulatory Visit: Payer: Medicare Other | Admitting: Family Medicine

## 2020-09-29 ENCOUNTER — Ambulatory Visit (INDEPENDENT_AMBULATORY_CARE_PROVIDER_SITE_OTHER): Payer: Medicare Other | Admitting: Physical Medicine and Rehabilitation

## 2020-09-29 ENCOUNTER — Other Ambulatory Visit: Payer: Self-pay

## 2020-09-29 DIAGNOSIS — M25511 Pain in right shoulder: Secondary | ICD-10-CM

## 2020-09-29 DIAGNOSIS — R202 Paresthesia of skin: Secondary | ICD-10-CM

## 2020-09-29 DIAGNOSIS — M25512 Pain in left shoulder: Secondary | ICD-10-CM

## 2020-09-29 NOTE — Progress Notes (Signed)
Pain in right hand and up arm to shoulder. Pain is on lateral side of hand. Wakes her up at night.  Right hand dominant No lotion per patient Numeric Pain Rating Scale and Functional Assessment Average Pain 10   In the last MONTH (on 0-10 scale) has pain interfered with the following?  1. General activity like being  able to carry out your everyday physical activities such as walking, climbing stairs, carrying groceries, or moving a chair?  Rating(8)

## 2020-09-30 ENCOUNTER — Encounter: Payer: Self-pay | Admitting: Physical Medicine and Rehabilitation

## 2020-09-30 NOTE — Progress Notes (Signed)
Renee Murray - 64 y.o. female MRN [id number]  Date of birth: 1956/04/18  Office Visit Note: Visit Date: 09/29/2020 PCP: Cain Saupe, MD Referred by: Cain Saupe, MD  Subjective: Chief Complaint  Patient presents with  . Right Hand - Pain  . Right Arm - Pain   HPI: Renee Murray is a 64 y.o. female who comes in today at the request of Dr. Glee Arvin for electrodiagnostic study of the Right upper extremities.  Patient is Right hand dominant.  She reports predominantly pain but some numbness particularly on the radial side of the hand on the right.  Very occasional left-sided symptoms.  She reports pain goes from the hand up the arm through the elbow into the shoulder.  She denies radicular pain down the arm.  She does have some history of neck pain.  She has not had prior electrodiagnostic studies.  She rates her pain as a 10 out of 10.  She is somewhat confused because she has had several doctors looking at her lately.  She does saw Dr. Pearlean Brownie in neurology.  It looks like from that note they thought she had carpal tunnel syndrome or partly ulnar nerve neuropathy.  They ordered MRI of the cervical spine and brain.  Patient does have a history of prior subarachnoid hemorrhage as well as aneurysm clipping by Dr. Conchita Paris.  Her case seems to be complicated by history of anxiety and generalized chronic pain condition.  From the notes it looks like she is complained of her hand as far back as last year and there were several no-show appointments at various providers.  She does have a history of diabetes.  At least lately this has been better controlled with normal A1c but has had a history of high A1c numbers in the past.   ROS Otherwise per HPI.  Assessment & Plan: Visit Diagnoses:  1. Paresthesia of skin   2. Acute pain of both shoulders     Plan: Impression: The above electrodiagnostic study is ABNORMAL and reveals evidence of a moderate to severe right median nerve entrapment at the  wrist (carpal tunnel syndrome) affecting sensory and motor components.   There is no significant electrodiagnostic evidence of any other focal nerve entrapment, brachial plexopathy or cervical radiculopathy.   Recommendations: 1.  Follow-up with referring physician. 2.  Continue current management of symptoms. 3.  Suggest surgical evaluation.  Meds & Orders: No orders of the defined types were placed in this encounter.   Orders Placed This Encounter  Procedures  . NCV with EMG (electromyography)    Follow-up: Return for  Glee Arvin, M.D..   Procedures: No procedures performed  EMG & NCV Findings: Evaluation of the right median motor nerve showed prolonged distal onset latency (5.3 ms) and decreased conduction velocity (Elbow-Wrist, 48 m/s).  The right median (across palm) sensory nerve showed prolonged distal peak latency (Wrist, 5.7 ms), reduced amplitude (7.2 V), and prolonged distal peak latency (Palm, 3.8 ms).  All remaining nerves (as indicated in the following tables) were within normal limits.    All examined muscles (as indicated in the following table) showed no evidence of electrical instability.    Impression: The above electrodiagnostic study is ABNORMAL and reveals evidence of a moderate to severe right median nerve entrapment at the wrist (carpal tunnel syndrome) affecting sensory and motor components.   There is no significant electrodiagnostic evidence of any other focal nerve entrapment, brachial plexopathy or cervical radiculopathy.   Recommendations: 1.  Follow-up  with referring physician. 2.  Continue current management of symptoms. 3.  Suggest surgical evaluation.  ___________________________ Naaman Plummer FAAPMR Board Certified, American Board of Physical Medicine and Rehabilitation    Nerve Conduction Studies Anti Sensory Summary Table   Stim Site NR Peak (ms) Norm Peak (ms) P-T Amp (V) Norm P-T Amp Site1 Site2 Delta-P (ms) Dist (cm) Vel (m/s) Norm  Vel (m/s)  Right Median Acr Palm Anti Sensory (2nd Digit)  30.5C  Wrist    *5.7 <3.6 *7.2 >10 Wrist Palm 1.9 0.0    Palm    *3.8 <2.0 6.3         Right Radial Anti Sensory (Base 1st Digit)  30.6C  Wrist    2.1 <3.1 30.5  Wrist Base 1st Digit 2.1 0.0    Right Ulnar Anti Sensory (5th Digit)  30.8C  Wrist    3.3 <3.7 18.7 >15.0 Wrist 5th Digit 3.3 14.0 42 >38   Motor Summary Table   Stim Site NR Onset (ms) Norm Onset (ms) O-P Amp (mV) Norm O-P Amp Site1 Site2 Delta-0 (ms) Dist (cm) Vel (m/s) Norm Vel (m/s)  Right Median Motor (Abd Poll Brev)  30.7C  Wrist    *5.3 <4.2 5.4 >5 Elbow Wrist 4.4 21.0 *48 >50  Elbow    9.7  5.1         Right Ulnar Motor (Abd Dig Min)  30.8C  Wrist    2.9 <4.2 8.5 >3 B Elbow Wrist 3.0 18.0 60 >53  B Elbow    5.9  7.4  A Elbow B Elbow 1.4 9.0 64 >53  A Elbow    7.3  9.0          EMG   Side Muscle Nerve Root Ins Act Fibs Psw Amp Dur Poly Recrt Int Dennie Bible Comment  Right Abd Poll Brev Median C8-T1 Nml Nml Nml Nml Nml 0 Nml Nml   Right 1stDorInt Ulnar C8-T1 Nml Nml Nml Nml Nml 0 Nml Nml   Right PronatorTeres Median C6-7 Nml Nml Nml Nml Nml 0 Nml Nml   Right Biceps Musculocut C5-6 Nml Nml Nml Nml Nml 0 Nml Nml   Right Deltoid Axillary C5-6 Nml Nml Nml Nml Nml 0 Nml Nml     Nerve Conduction Studies Anti Sensory Left/Right Comparison   Stim Site L Lat (ms) R Lat (ms) L-R Lat (ms) L Amp (V) R Amp (V) L-R Amp (%) Site1 Site2 L Vel (m/s) R Vel (m/s) L-R Vel (m/s)  Median Acr Palm Anti Sensory (2nd Digit)  30.5C  Wrist  *5.7   *7.2  Wrist Palm     Palm  *3.8   6.3        Radial Anti Sensory (Base 1st Digit)  30.6C  Wrist  2.1   30.5  Wrist Base 1st Digit     Ulnar Anti Sensory (5th Digit)  30.8C  Wrist  3.3   18.7  Wrist 5th Digit  42    Motor Left/Right Comparison   Stim Site L Lat (ms) R Lat (ms) L-R Lat (ms) L Amp (mV) R Amp (mV) L-R Amp (%) Site1 Site2 L Vel (m/s) R Vel (m/s) L-R Vel (m/s)  Median Motor (Abd Poll Brev)  30.7C  Wrist  *5.3   5.4   Elbow Wrist  *48   Elbow  9.7   5.1        Ulnar Motor (Abd Dig Min)  30.8C  Wrist  2.9   8.5  B Elbow Wrist  60   B Elbow  5.9   7.4  A Elbow B Elbow  64   A Elbow  7.3   9.0           Waveforms:             Clinical History: No specialty comments available.   She reports that she has been smoking cigarettes. She has been smoking about 0.00 packs per day for the past 20.00 years. She has never used smokeless tobacco.  Recent Labs    10/21/19 0939 06/23/20 1055  HGBA1C 7.9* 5.7    Objective:  VS:  HT:    WT:   BMI:     BP:   HR: bpm  TEMP: ( )  RESP:  Physical Exam Musculoskeletal:        General: No swelling, tenderness or deformity.     Comments: Inspection reveals mild flattening of the right APB no atrophy of the bilateral APB or FDI or hand intrinsics. There is no swelling, color changes, allodynia or dystrophic changes. There is 5 out of 5 strength in the bilateral wrist extension, finger abduction and long finger flexion.  Subjectively impaired sensation in the median nerve distribution on the right. There is a negative Froment's test bilaterally. There is a negative Tinel's test at the bilateral  elbow. There is a negative Hoffmann's test bilaterally.  Skin:    General: Skin is warm and dry.     Findings: No erythema or rash.  Neurological:     General: No focal deficit present.     Mental Status: She is alert and oriented to person, place, and time.     Motor: No weakness or abnormal muscle tone.     Coordination: Coordination normal.  Psychiatric:        Mood and Affect: Mood normal.        Behavior: Behavior normal.     Ortho Exam  Imaging: No results found.  Past Medical/Family/Surgical/Social History: Medications & Allergies reviewed per EMR, new medications updated. Patient Active Problem List   Diagnosis Date Noted  . Acute midline low back pain 12/27/2016  . Acute pain of both shoulders 12/27/2016  . Rotator cuff impingement syndrome  12/27/2016  . Essential hypertension 01/25/2016  . Chronic pain syndrome 01/25/2016  . Prediabetes 10/13/2015  . Anxiety about health 09/13/2015  . Neuropathy 09/13/2015  . Chronic generalized pain 09/13/2015  . History of seasonal allergies 09/13/2015  . SAH (subarachnoid hemorrhage) (HCC)   . Subarachnoid hemorrhage (HCC) 02/04/2015  . History of sinus disease on CT 08/10/2014  . Hypertension, goal below 150/90 03/17/2014  . Health care maintenance 03/17/2014  . Tobacco abuse 03/17/2014  . Neuropathy, upper extremity bilaterally  08/06/2013   Past Medical History:  Diagnosis Date  . Brain aneurysm   . Diabetes mellitus without complication (HCC)   . Hypertension   . Stroke [facility])    stroke like symptons/admitted 8/21   Family History  Problem Relation Age of Onset  . Diabetes Mother   . Heart failure Mother   . Cancer Father    Past Surgical History:  Procedure Laterality Date  . ABDOMINAL HYSTERECTOMY    . CHOLECYSTECTOMY    . CRANIOTOMY Right 02/04/2015   Procedure: RIGHT CRANIOTOMY FOR ANEURYSM ;  Surgeon: Lisbeth RenshawNeelesh Nundkumar, MD;  Location: [facility];  Service: Neurosurgery;  Laterality: Right;  . IR GENERIC HISTORICAL  02/04/2015   IR ANGIO INTRA EXTRACRAN SEL COM CAROTID INNOMINATE BILAT MOD SED 02/04/2015 Marlane HatcherNeelesh  Conchita Paris, MD [facility]  . IR GENERIC HISTORICAL  02/04/2015   IR ANGIO VERTEBRAL SEL SUBCLAVIAN INNOMINATE UNI R MOD SED 02/04/2015 Lisbeth Renshaw, MD [facility]  . IR GENERIC HISTORICAL  02/04/2015   IR ANGIO VERTEBRAL SEL VERTEBRAL UNI L MOD SED 02/04/2015 Lisbeth Renshaw, MD [facility]   Social History   Occupational History  . Occupation: retired  Tobacco Use  . Smoking status: Current Some Day Smoker    Packs/day: 0.00    Years: 20.00    Pack years: 0.00    Types: Cigarettes  . Smokeless tobacco: Never Used  . Tobacco comment: reports 1 pack per month  Vaping Use  . Vaping Use: Never used  Substance and Sexual Activity  .  Alcohol use: Yes    Comment: occasionally  . Drug use: No  . Sexual activity: Not Currently

## 2020-10-05 ENCOUNTER — Ambulatory Visit (INDEPENDENT_AMBULATORY_CARE_PROVIDER_SITE_OTHER): Payer: Medicare Other | Admitting: Orthopaedic Surgery

## 2020-10-05 ENCOUNTER — Encounter: Payer: Self-pay | Admitting: Orthopaedic Surgery

## 2020-10-05 VITALS — Ht <= 58 in | Wt 143.0 lb

## 2020-10-05 DIAGNOSIS — R202 Paresthesia of skin: Secondary | ICD-10-CM

## 2020-10-05 DIAGNOSIS — G5601 Carpal tunnel syndrome, right upper limb: Secondary | ICD-10-CM

## 2020-10-05 MED ORDER — METHYLPREDNISOLONE ACETATE 40 MG/ML IJ SUSP
40.0000 mg | INTRAMUSCULAR | Status: AC | PRN
  Administered 2020-10-05: 40 mg

## 2020-10-05 MED ORDER — TRAMADOL HCL 50 MG PO TABS: 50.0000 mg | ORAL_TABLET | Freq: Three times a day (TID) | ORAL | 0 refills | Status: DC | PRN

## 2020-10-05 MED ORDER — BUPIVACAINE HCL 0.5 % IJ SOLN
1.0000 mL | INTRAMUSCULAR | Status: AC | PRN
  Administered 2020-10-05: 1 mL

## 2020-10-05 MED ORDER — LIDOCAINE HCL 1 % IJ SOLN
1.0000 mL | INTRAMUSCULAR | Status: AC | PRN
  Administered 2020-10-05: 1 mL

## 2020-10-05 NOTE — Progress Notes (Signed)
Office Visit Note   Patient: Renee Murray           Date of Birth: 1956-11-01           MRN: [id number] Visit Date: 10/05/2020              Requested by: Cain Saupe, MD [address] [place],  [place] [address] PCP: Cain Saupe, MD   Assessment & Plan: Visit Diagnoses:  1. Carpal tunnel syndrome, right upper limb   2. Paresthesias in right hand     Plan: Impression is moderate to severe carpal tunnel syndrome but these are really not consistent with her clinical exam.  We have proceeded with a diagnostic and hopefully therapeutic carpal tunnel injection.  She will follow up with [place] 2 to 3 weeks from now if her symptoms have not improved.  Follow-Up Instructions: Return if symptoms worsen or fail to improve.   Orders:  No orders of the defined types were placed in this encounter.  Meds ordered this encounter  Medications  . traMADol (ULTRAM) 50 MG tablet    Sig: Take 1 tablet (50 mg total) by mouth 3 (three) times daily as needed.    Dispense:  30 tablet    Refill:  0      Procedures: Hand/UE Inj: R carpal tunnel for carpal tunnel syndrome on 10/05/2020 9:40 PM Indications: pain Details: 25 G needle Medications: 1 mL lidocaine 1 %; 1 mL bupivacaine 0.5 %; 40 mg methylPREDNISolone acetate 40 MG/ML Outcome: tolerated well, no immediate complications Patient was prepped and draped in the usual sterile fashion.       Clinical Data: No additional findings.   Subjective: Chief Complaint  Patient presents with  . Right Wrist - Follow-up    EMG review    HPI patient is a very pleasant 64 year old female who comes in today to discuss nerve conduction studies right upper extremity.  She has been complaining of ulnar-sided forearm pain and paresthesias primarily to the right ring and small fingers.  She was referred to [place] by neurology where subsequent nerve conduction study was ordered.  This demonstrated moderate to severe median nerve compression.  No  evidence of ulnar nerve compression on nerve conduction study.  She denies any symptoms to the thumb, index or long fingers.  No history of cervical spine pathology.  No neck pain.  Review of Systems as detailed in HPI.  All others reviewed and are negative.   Objective: Vital Signs: Ht 4\' 9"  (1.448 m)   Wt 143 lb (64.9 kg)   BMI 30.94 kg/m   Physical Exam well-developed well-nourished female no acute distress.  Alert oriented x3.  Ortho Exam right arm exam shows a negative Tinel at the elbow.  Positive Tinel at the wrist.  Negative Phalen's.  Full grip strength.  She is neurovascular intact distally.  Specialty Comments:  No specialty comments available.  Imaging: No new imaging   PMFS History: Patient Active Problem List   Diagnosis Date Noted  . Acute midline low back pain 12/27/2016  . Acute pain of both shoulders 12/27/2016  . Rotator cuff impingement syndrome 12/27/2016  . Essential hypertension 01/25/2016  . Chronic pain syndrome 01/25/2016  . Prediabetes 10/13/2015  . Anxiety about health 09/13/2015  . Neuropathy 09/13/2015  . Chronic generalized pain 09/13/2015  . History of seasonal allergies 09/13/2015  . SAH (subarachnoid hemorrhage) (HCC)   . Subarachnoid hemorrhage (HCC) 02/04/2015  . History of sinus disease on CT 08/10/2014  .  Hypertension, goal below 150/90 03/17/2014  . Health care maintenance 03/17/2014  . Tobacco abuse 03/17/2014  . Neuropathy, upper extremity bilaterally  08/06/2013   Past Medical History:  Diagnosis Date  . Brain aneurysm   . Diabetes mellitus without complication (HCC)   . Hypertension   . Stroke [facility])    stroke like symptons/admitted 8/21    Family History  Problem Relation Age of Onset  . Diabetes Mother   . Heart failure Mother   . Cancer Father     Past Surgical History:  Procedure Laterality Date  . ABDOMINAL HYSTERECTOMY    . CHOLECYSTECTOMY    . CRANIOTOMY Right 02/04/2015   Procedure: RIGHT CRANIOTOMY FOR  ANEURYSM ;  Surgeon: Lisbeth Renshaw, MD;  Location: [facility]UR[facility];  Service: Neurosurgery;  Laterality: Right;  . IR GENERIC HISTORICAL  02/04/2015   IR ANGIO INTRA EXTRACRAN SEL COM CAROTID INNOMINATE BILAT MOD SED 02/04/2015 Lisbeth Renshaw, MD [facility]  . IR GENERIC HISTORICAL  02/04/2015   IR ANGIO VERTEBRAL SEL SUBCLAVIAN INNOMINATE UNI R MOD SED 02/04/2015 Lisbeth Renshaw, MD [facility]  . IR GENERIC HISTORICAL  02/04/2015   IR ANGIO VERTEBRAL SEL VERTEBRAL UNI L MOD SED 02/04/2015 Lisbeth Renshaw, MD [facility]   Social History   Occupational History  . Occupation: retired  Tobacco Use  . Smoking status: Current Some Day Smoker    Packs/day: 0.00    Years: 20.00    Pack years: 0.00    Types: Cigarettes  . Smokeless tobacco: Never Used  . Tobacco comment: reports 1 pack per month  Vaping Use  . Vaping Use: Never used  Substance and Sexual Activity  . Alcohol use: Yes    Comment: occasionally  . Drug use: No  . Sexual activity: Not Currently

## 2020-10-05 NOTE — Procedures (Signed)
EMG & NCV Findings: Evaluation of the right median motor nerve showed prolonged distal onset latency (5.3 ms) and decreased conduction velocity (Elbow-Wrist, 48 m/s).  The right median (across palm) sensory nerve showed prolonged distal peak latency (Wrist, 5.7 ms), reduced amplitude (7.2 V), and prolonged distal peak latency (Palm, 3.8 ms).  All remaining nerves (as indicated in the following tables) were within normal limits.    All examined muscles (as indicated in the following table) showed no evidence of electrical instability.    Impression: The above electrodiagnostic study is ABNORMAL and reveals evidence of a moderate to severe right median nerve entrapment at the wrist (carpal tunnel syndrome) affecting sensory and motor components.   There is no significant electrodiagnostic evidence of any other focal nerve entrapment, brachial plexopathy or cervical radiculopathy.   Recommendations: 1.  Follow-up with referring physician. 2.  Continue current management of symptoms. 3.  Suggest surgical evaluation.  ___________________________ Naaman Plummer FAAPMR Board Certified, American Board of Physical Medicine and Rehabilitation    Nerve Conduction Studies Anti Sensory Summary Table   Stim Site NR Peak (ms) Norm Peak (ms) P-T Amp (V) Norm P-T Amp Site1 Site2 Delta-P (ms) Dist (cm) Vel (m/s) Norm Vel (m/s)  Right Median Acr Palm Anti Sensory (2nd Digit)  30.5C  Wrist    *5.7 <3.6 *7.2 >10 Wrist Palm 1.9 0.0    Palm    *3.8 <2.0 6.3         Right Radial Anti Sensory (Base 1st Digit)  30.6C  Wrist    2.1 <3.1 30.5  Wrist Base 1st Digit 2.1 0.0    Right Ulnar Anti Sensory (5th Digit)  30.8C  Wrist    3.3 <3.7 18.7 >15.0 Wrist 5th Digit 3.3 14.0 42 >38   Motor Summary Table   Stim Site NR Onset (ms) Norm Onset (ms) O-P Amp (mV) Norm O-P Amp Site1 Site2 Delta-0 (ms) Dist (cm) Vel (m/s) Norm Vel (m/s)  Right Median Motor (Abd Poll Brev)  30.7C  Wrist    *5.3 <4.2 5.4 >5 Elbow  Wrist 4.4 21.0 *48 >50  Elbow    9.7  5.1         Right Ulnar Motor (Abd Dig Min)  30.8C  Wrist    2.9 <4.2 8.5 >3 B Elbow Wrist 3.0 18.0 60 >53  B Elbow    5.9  7.4  A Elbow B Elbow 1.4 9.0 64 >53  A Elbow    7.3  9.0          EMG   Side Muscle Nerve Root Ins Act Fibs Psw Amp Dur Poly Recrt Int Dennie Bible Comment  Right Abd Poll Brev Median C8-T1 Nml Nml Nml Nml Nml 0 Nml Nml   Right 1stDorInt Ulnar C8-T1 Nml Nml Nml Nml Nml 0 Nml Nml   Right PronatorTeres Median C6-7 Nml Nml Nml Nml Nml 0 Nml Nml   Right Biceps Musculocut C5-6 Nml Nml Nml Nml Nml 0 Nml Nml   Right Deltoid Axillary C5-6 Nml Nml Nml Nml Nml 0 Nml Nml     Nerve Conduction Studies Anti Sensory Left/Right Comparison   Stim Site L Lat (ms) R Lat (ms) L-R Lat (ms) L Amp (V) R Amp (V) L-R Amp (%) Site1 Site2 L Vel (m/s) R Vel (m/s) L-R Vel (m/s)  Median Acr Palm Anti Sensory (2nd Digit)  30.5C  Wrist  *5.7   *7.2  Wrist Palm     Palm  *3.8   6.3  Radial Anti Sensory (Base 1st Digit)  30.6C  Wrist  2.1   30.5  Wrist Base 1st Digit     Ulnar Anti Sensory (5th Digit)  30.8C  Wrist  3.3   18.7  Wrist 5th Digit  42    Motor Left/Right Comparison   Stim Site L Lat (ms) R Lat (ms) L-R Lat (ms) L Amp (mV) R Amp (mV) L-R Amp (%) Site1 Site2 L Vel (m/s) R Vel (m/s) L-R Vel (m/s)  Median Motor (Abd Poll Brev)  30.7C  Wrist  *5.3   5.4  Elbow Wrist  *48   Elbow  9.7   5.1        Ulnar Motor (Abd Dig Min)  30.8C  Wrist  2.9   8.5  B Elbow Wrist  60   B Elbow  5.9   7.4  A Elbow B Elbow  64   A Elbow  7.3   9.0           Waveforms:

## 2020-10-06 ENCOUNTER — Telehealth: Payer: Self-pay | Admitting: Orthopaedic Surgery

## 2020-10-06 NOTE — Telephone Encounter (Signed)
See message below °

## 2020-10-06 NOTE — Telephone Encounter (Signed)
Patient called. She would like Mardella Layman to call her. Says she is having pain. The tremodol is not helping but makes her sleepy. Would like something else. Some swelling in her fingers and her hand is stiff. Her call back number is [phone]

## 2020-10-07 ENCOUNTER — Other Ambulatory Visit: Payer: Self-pay | Admitting: Physician Assistant

## 2020-10-07 MED ORDER — PREDNISONE 10 MG (21) PO TBPK: ORAL_TABLET | ORAL | 0 refills | Status: DC

## 2020-10-07 NOTE — Telephone Encounter (Signed)
Only take tramadol at night then.  I have also called in a steroid.  Remind her that injection can take 2 weeks before it helps.  Have her come in at least two weeks after injection if still in pain

## 2020-10-07 NOTE — Telephone Encounter (Signed)
Advised patient

## 2020-10-11 ENCOUNTER — Ambulatory Visit: Payer: Medicare Other | Admitting: Family Medicine

## 2020-10-14 ENCOUNTER — Encounter: Payer: 59 | Admitting: Neurology

## 2020-10-20 ENCOUNTER — Telehealth: Payer: Self-pay | Admitting: Family Medicine

## 2020-10-20 NOTE — Telephone Encounter (Signed)
Spoke w/pt to advise her there was an earlier appt avail due to UTI symptoms pt satisfied w/appt

## 2020-10-20 NOTE — Telephone Encounter (Signed)
Copied from CRM [id number]. Topic: General - Other >> Oct 20, 2020  9:26 AM Dalphine Handing A wrote: Patient has an upcoming appt 11/12 but wants to know if Dr. Jillyn Hidden can send any antibiotics to pharmacy for uti. Please advise

## 2020-10-21 ENCOUNTER — Other Ambulatory Visit: Payer: Self-pay

## 2020-10-21 ENCOUNTER — Ambulatory Visit: Payer: Medicare Other | Attending: Family Medicine | Admitting: Family Medicine

## 2020-10-21 ENCOUNTER — Encounter: Payer: Self-pay | Admitting: Family Medicine

## 2020-10-21 VITALS — BP 146/84 | HR 62 | Wt 140.2 lb

## 2020-10-21 DIAGNOSIS — Z833 Family history of diabetes mellitus: Secondary | ICD-10-CM | POA: Insufficient documentation

## 2020-10-21 DIAGNOSIS — N390 Urinary tract infection, site not specified: Secondary | ICD-10-CM

## 2020-10-21 DIAGNOSIS — R300 Dysuria: Secondary | ICD-10-CM | POA: Diagnosis not present

## 2020-10-21 DIAGNOSIS — I10 Essential (primary) hypertension: Secondary | ICD-10-CM | POA: Diagnosis not present

## 2020-10-21 DIAGNOSIS — E119 Type 2 diabetes mellitus without complications: Secondary | ICD-10-CM

## 2020-10-21 DIAGNOSIS — Z780 Asymptomatic menopausal state: Secondary | ICD-10-CM | POA: Diagnosis not present

## 2020-10-21 DIAGNOSIS — Z1231 Encounter for screening mammogram for malignant neoplasm of breast: Secondary | ICD-10-CM | POA: Diagnosis not present

## 2020-10-21 DIAGNOSIS — F17210 Nicotine dependence, cigarettes, uncomplicated: Secondary | ICD-10-CM | POA: Diagnosis not present

## 2020-10-21 DIAGNOSIS — F411 Generalized anxiety disorder: Secondary | ICD-10-CM | POA: Diagnosis not present

## 2020-10-21 DIAGNOSIS — N3000 Acute cystitis without hematuria: Secondary | ICD-10-CM | POA: Diagnosis not present

## 2020-10-21 DIAGNOSIS — Z79899 Other long term (current) drug therapy: Secondary | ICD-10-CM | POA: Insufficient documentation

## 2020-10-21 DIAGNOSIS — Z7901 Long term (current) use of anticoagulants: Secondary | ICD-10-CM | POA: Insufficient documentation

## 2020-10-21 DIAGNOSIS — Z1211 Encounter for screening for malignant neoplasm of colon: Secondary | ICD-10-CM

## 2020-10-21 LAB — POCT URINALYSIS DIP (CLINITEK)
Bilirubin, UA: NEGATIVE
Glucose, UA: NEGATIVE mg/dL
Ketones, POC UA: NEGATIVE mg/dL
Nitrite, UA: POSITIVE — AB
POC PROTEIN,UA: NEGATIVE
Spec Grav, UA: 1.01
Urobilinogen, UA: 1 U/dL
pH, UA: 7

## 2020-10-21 LAB — GLUCOSE, POCT (MANUAL RESULT ENTRY): POC Glucose: 90 mg/dL (ref 70–99)

## 2020-10-21 LAB — POCT GLYCOSYLATED HEMOGLOBIN (HGB A1C): Hemoglobin A1C: 5.8 % — AB (ref 4.0–5.6)

## 2020-10-21 MED ORDER — CEPHALEXIN 500 MG PO CAPS: 500.0000 mg | ORAL_CAPSULE | Freq: Two times a day (BID) | ORAL | 0 refills | Status: AC

## 2020-10-21 NOTE — Progress Notes (Signed)
Urinary frequency Burning when urinating Bladder pain

## 2020-10-21 NOTE — Progress Notes (Signed)
Established Patient Office Visit  Subjective:  Patient ID: Renee Murray, female    DOB: 1956-11-10  Age: 64 y.o. MRN: [id number]  CC:  Chief Complaint  Patient presents with  . Follow-up    diabetes    HPI Renee Murray, 64 yo female, seen in follow-up of chronic medical issues including diabetes- last Hgb A1c was 5.7 in July and before that time 7.9 in July of 2020, hypertension, and she has a new complaint of possible urinary tract infection as she has been having burning sensation with urination. She is status post Neurology visit on 09/22/2020 with Dr. Leonie Man regarding numbness/paresthesias and had EMG on 09/29/2020 showing moderate to severe nerve entrapment at the right wrist  She is s/p 10/19/021 visit with Dr. Erlinda Hong in Orthopedics regarding carpal tunnel syndrome at which time she received an injection. .          Past Medical History:  Diagnosis Date  . Brain aneurysm   . Diabetes mellitus without complication ([place])   . Hypertension   . Stroke [facility])    stroke like symptons/admitted 8/21    Past Surgical History:  Procedure Laterality Date  . ABDOMINAL HYSTERECTOMY    . CHOLECYSTECTOMY    . CRANIOTOMY Right 02/04/2015   Procedure: RIGHT CRANIOTOMY FOR ANEURYSM ;  Surgeon: Consuella Lose, MD;  Location: [place] NEURO ORS;  Service: Neurosurgery;  Laterality: Right;  . IR GENERIC HISTORICAL  02/04/2015   IR ANGIO INTRA EXTRACRAN SEL COM CAROTID INNOMINATE BILAT MOD SED 02/04/2015 Consuella Lose, MD [facility]INTER[facility]  . IR GENERIC HISTORICAL  02/04/2015   IR ANGIO VERTEBRAL SEL SUBCLAVIAN INNOMINATE UNI R MOD SED 02/04/2015 Consuella Lose, MD [facility]  . IR GENERIC HISTORICAL  02/04/2015   IR ANGIO VERTEBRAL SEL VERTEBRAL UNI L MOD SED 02/04/2015 Consuella Lose, MD [facility]    Family History  Problem Relation Age of Onset  . Diabetes Mother   . Heart failure Mother   . Cancer Father     Social History   Socioeconomic History  . Marital status: Divorced      Spouse name: Not on file  . Number of children: 3  . Years of education: Not on file  . Highest education level: High school graduate  Occupational History  . Occupation: retired  Tobacco Use  . Smoking status: Current Some Day Smoker    Packs/day: 0.00    Years: 20.00    Pack years: 0.00    Types: Cigarettes  . Smokeless tobacco: Never Used  . Tobacco comment: reports 1 pack per month  Vaping Use  . Vaping Use: Never used  Substance and Sexual Activity  . Alcohol use: Yes    Comment: occasionally  . Drug use: No  . Sexual activity: Not Currently  Other Topics Concern  . Not on file  Social History Narrative   Lives alone   Right handed   Drinks 3-4 cups of caffeine daily   Social Determinants of Health   Financial Resource Strain:   . Difficulty of Paying Living Expenses: Not on file  Food Insecurity:   . Worried About Charity fundraiser in the Last Year: Not on file  . Ran Out of Food in the Last Year: Not on file  Transportation Needs:   . Lack of Transportation (Medical): Not on file  . Lack of Transportation (Non-Medical): Not on file  Physical Activity:   . Days of Exercise per Week: Not on file  . Minutes  of Exercise per Session: Not on file  Stress:   . Feeling of Stress : Not on file  Social Connections:   . Frequency of Communication with Friends and Family: Not on file  . Frequency of Social Gatherings with Friends and Family: Not on file  . Attends Religious Services: Not on file  . Active Member of Clubs or Organizations: Not on file  . Attends Archivist Meetings: Not on file  . Marital Status: Not on file  Intimate Partner Violence:   . Fear of Current or Ex-Partner: Not on file  . Emotionally Abused: Not on file  . Physically Abused: Not on file  . Sexually Abused: Not on file    Outpatient Medications Prior to Visit  Medication Sig Dispense Refill  . amLODipine (NORVASC) 10 MG tablet Take 1 tablet (10 mg total) by mouth daily.  To lower blood pressure 90 tablet 1  . Blood Glucose Monitoring Suppl (ONETOUCH VERIO) w/Device KIT Use to test blood sugar once daily. E11.9 1 kit 0  . cetirizine (ZYRTEC) 10 MG tablet Take 1 tablet (10 mg total) by mouth daily. 90 tablet 1  . citalopram (CELEXA) 10 MG tablet Take 1 tablet (10 mg total) by mouth daily. 90 tablet 1  . fluticasone (FLONASE) 50 MCG/ACT nasal spray Place 2 sprays into both nostrils daily as needed for allergies or rhinitis. 1 g 5  . gabapentin (NEURONTIN) 300 MG capsule Take 1 capsule (300 mg total) by mouth 3 (three) times daily. 270 capsule 1  . glucose blood (ONETOUCH VERIO) test strip Use to check blood sugar once daily. E11.9 100 each 6  . ibuprofen (ADVIL) 200 MG tablet Take 200-800 mg by mouth every 6 (six) hours as needed (for pain or headaches).    [employer] [facility] DELICA PLUS LANCING) MISC Use to check blood sugar once daily. E11.9 1 each 6  . OneTouch Delica Lancets 74Q MISC Test blood sugars twice daily. E11.9 100 each 11  . predniSONE (DELTASONE) 20 MG tablet Take 1 tablet (20 mg total) by mouth daily with breakfast. 5 tablet 0  . traMADol (ULTRAM) 50 MG tablet Take 1 tablet (50 mg total) by mouth 3 (three) times daily as needed. 30 tablet 0  . trolamine salicylate (ASPERCREME) 10 % cream Apply 1 application topically as needed for muscle pain (or sciatica).     . methocarbamol (ROBAXIN) 500 MG tablet Take 1 tablet (500 mg total) by mouth every 8 (eight) hours as needed for muscle spasms. (Patient not taking: Reported on 10/21/2020) 60 tablet 1  . predniSONE (STERAPRED UNI-PAK 21 TAB) 10 MG (21) TBPK tablet Take as directed (Patient not taking: Reported on 10/21/2020) 21 tablet 0  . predniSONE (STERAPRED UNI-PAK 21 TAB) 10 MG (21) TBPK tablet Take as directed (Patient not taking: Reported on 10/21/2020) 21 tablet 0  . topiramate (TOPAMAX) 25 MG tablet Take 1 tablet (25 mg total) by mouth 2 (two) times daily. (Patient not taking: Reported on  10/21/2020) 30 tablet 2   No facility-administered medications prior to visit.    Allergies  Allergen Reactions  . Demerol Other (See Comments)    "I almost died"  . Morphine And Related Other (See Comments)    "Almost Died"    ROS Review of Systems  HENT: Negative for sore throat and trouble swallowing.   Eyes: Negative for photophobia and visual disturbance.  Respiratory: Negative for cough and shortness of breath.   Cardiovascular: Negative for chest pain and  palpitations.  Gastrointestinal: Negative for abdominal pain, constipation, diarrhea and nausea.  Endocrine: Negative for polydipsia, polyphagia and polyuria.  Genitourinary: Positive for dysuria and urgency.       Has noticed cloudy color to the urine and increased odor to the urine recently  Musculoskeletal: Positive for arthralgias. Negative for back pain.       CTS in right hand  Skin: Negative for rash and wound.  Neurological: Positive for numbness (right hand from CTS). Negative for dizziness and headaches.  Hematological: Negative for adenopathy. Does not bruise/bleed easily.  Psychiatric/Behavioral: Negative for suicidal ideas. The patient is nervous/anxious (has had some recent stressful life events).       Objective:    Physical Exam Vitals and nursing note reviewed.  Constitutional:      General: She is not in acute distress.    Appearance: Normal appearance.  Neck:     Vascular: No carotid bruit.  Cardiovascular:     Rate and Rhythm: Normal rate and regular rhythm.  Pulmonary:     Effort: Pulmonary effort is normal.     Breath sounds: Normal breath sounds.  Abdominal:     Palpations: Abdomen is soft.     Tenderness: There is no abdominal tenderness. There is no right CVA tenderness, left CVA tenderness, guarding or rebound.  Musculoskeletal:     Cervical back: Normal range of motion and neck supple. No tenderness.     Right lower leg: No edema.     Left lower leg: No edema.  Lymphadenopathy:      Cervical: No cervical adenopathy.  Skin:    General: Skin is warm and dry.     Comments: No active skin breakdown on the feet but presence of dry skin/mild callus on the heels and the sides of the feetjust below the toes  Neurological:     General: No focal deficit present.     Mental Status: She is alert and oriented to person, place, and time.  Psychiatric:        Mood and Affect: Mood normal.        Behavior: Behavior normal.     BP (!) 146/84 (BP Location: Right Arm, Patient Position: Sitting)   Pulse 62   Wt 140 lb 3.2 oz (63.6 kg)   SpO2 98%   BMI 30.34 kg/m patient did not take her blood pressure medication prior to today's visit Wt Readings from Last 3 Encounters:  10/21/20 140 lb 3.2 oz (63.6 kg)  10/05/20 143 lb (64.9 kg)  09/22/20 143 lb (64.9 kg)     Health Maintenance Due  Topic Date Due  . URINE MICROALBUMIN  Never done  . COLONOSCOPY  Never done  . PAP SMEAR-Modifier  08/25/2011  . DEXA SCAN  Never done  . PNA vac Low Risk Adult (1 of 2 - PCV13) Never done  . MAMMOGRAM  02/06/2020    There are no preventive care reminders to display for this patient.  Lab Results  Component Value Date   TSH 0.738 08/06/2013   Lab Results  Component Value Date   WBC 6.5 07/22/2020   HGB 13.6 07/22/2020   HCT 40.0 07/22/2020   MCV 90.3 07/22/2020   PLT 175 07/22/2020   Lab Results  Component Value Date   NA 143 07/22/2020   K 3.5 07/22/2020   CO2 26 07/22/2020   GLUCOSE 125 (H) 07/22/2020   BUN 10 07/22/2020   CREATININE 0.60 07/22/2020   BILITOT 0.4 07/22/2020   ALKPHOS 73  07/22/2020   AST 21 07/22/2020   ALT 14 07/22/2020   PROT 6.3 (L) 07/22/2020   ALBUMIN 3.6 07/22/2020   CALCIUM 8.9 07/22/2020   ANIONGAP 8 07/22/2020   Lab Results  Component Value Date   CHOL 160 10/21/2019   Lab Results  Component Value Date   HDL 49 10/21/2019   Lab Results  Component Value Date   LDLCALC 92 10/21/2019   Lab Results  Component Value Date   TRIG 103  10/21/2019   Lab Results  Component Value Date   CHOLHDL 3.3 10/21/2019   Lab Results  Component Value Date   HGBA1C 5.8 (A) 10/21/2020      Assessment & Plan:  1. Type 2 diabetes mellitus without complication, without long-term current use of insulin (HCC) Hgb A1c of 5.8 and glucose of 90 at today's visit. Diabetes is diet controlled at this time.  - POCT URINALYSIS DIP (CLINITEK) - POCT glycosylated hemoglobin (Hb A1C) - POCT glucose (manual entry)  2. Dysuria Will obtain UA in follow-up of patient's complaint of dysuria to look for abnormalities that may indicate the presence of a urinary tract infection.  - POCT URINALYSIS DIP (CLINITEK)  3. Essential hypertension Continue use of amlodipine and a low salt diet  4. GAD (generalized anxiety disorder) She reports that she is currently doing well on citalopram but has had sone recent increase in life stressors. She is encouraged to consider therapy  5. Encounter for screening mammogram for malignant neoplasm of breast Referral placed for screening mammogram - MM Digital Screening; Future  6. Screening for colon cancer Referral placed to GI for patient's screening colonoscopy - Ambulatory referral to Gastroenterology  7. Postmenopausal estrogen deficiency She agrees to be referred for DEXA - DG DXA FRACTURE ASSESSMENT; Future  8. Acute cystitis without hematuria UA abnormal. She will be treated with keflex 500 mg twice daily for 7 days for UTI. She will be notified if a change in antibiotic therapy is needed based on the urine culture results. - cephALEXin (KEFLEX) 500 MG capsule; Take 1 capsule (500 mg total) by mouth 2 (two) times daily for 7 days.  Dispense: 14 capsule; Refill: 0 - Urine culture  Meds ordered this encounter  Medications  . cephALEXin (KEFLEX) 500 MG capsule    Sig: Take 1 capsule (500 mg total) by mouth 2 (two) times daily for 7 days.    Dispense:  14 capsule    Refill:  0    Follow-up: Return  in about 4 months (around 02/18/2021) for DM/chronic issues-sooner if needed.    Antony Blackbird, MD

## 2020-10-25 ENCOUNTER — Other Ambulatory Visit: Payer: Self-pay | Admitting: Family Medicine

## 2020-10-25 LAB — URINE CULTURE

## 2020-10-29 ENCOUNTER — Ambulatory Visit: Payer: Medicare Other | Admitting: Family

## 2020-12-06 ENCOUNTER — Other Ambulatory Visit: Payer: Self-pay

## 2020-12-06 ENCOUNTER — Emergency Department (HOSPITAL_COMMUNITY): Payer: Medicare Other

## 2020-12-06 ENCOUNTER — Emergency Department (HOSPITAL_COMMUNITY)
Admission: EM | Admit: 2020-12-06 | Discharge: 2020-12-06 | Disposition: A | Discharge status: Home / Self Care | Payer: Medicare Other | Attending: Emergency Medicine | Admitting: Emergency Medicine

## 2020-12-06 DIAGNOSIS — R519 Headache, unspecified: Secondary | ICD-10-CM | POA: Diagnosis not present

## 2020-12-06 DIAGNOSIS — E119 Type 2 diabetes mellitus without complications: Secondary | ICD-10-CM | POA: Insufficient documentation

## 2020-12-06 DIAGNOSIS — Z79899 Other long term (current) drug therapy: Secondary | ICD-10-CM | POA: Diagnosis not present

## 2020-12-06 DIAGNOSIS — Z20822 Contact with and (suspected) exposure to covid-19: Secondary | ICD-10-CM | POA: Diagnosis not present

## 2020-12-06 DIAGNOSIS — R52 Pain, unspecified: Secondary | ICD-10-CM | POA: Diagnosis not present

## 2020-12-06 DIAGNOSIS — M25531 Pain in right wrist: Secondary | ICD-10-CM | POA: Insufficient documentation

## 2020-12-06 DIAGNOSIS — M542 Cervicalgia: Secondary | ICD-10-CM | POA: Diagnosis not present

## 2020-12-06 DIAGNOSIS — M25551 Pain in right hip: Secondary | ICD-10-CM | POA: Diagnosis not present

## 2020-12-06 DIAGNOSIS — G4489 Other headache syndrome: Secondary | ICD-10-CM | POA: Diagnosis not present

## 2020-12-06 DIAGNOSIS — I10 Essential (primary) hypertension: Secondary | ICD-10-CM | POA: Insufficient documentation

## 2020-12-06 DIAGNOSIS — M549 Dorsalgia, unspecified: Secondary | ICD-10-CM | POA: Diagnosis present

## 2020-12-06 DIAGNOSIS — Z041 Encounter for examination and observation following transport accident: Secondary | ICD-10-CM | POA: Diagnosis not present

## 2020-12-06 DIAGNOSIS — F17210 Nicotine dependence, cigarettes, uncomplicated: Secondary | ICD-10-CM | POA: Insufficient documentation

## 2020-12-06 DIAGNOSIS — S3991XA Unspecified injury of abdomen, initial encounter: Secondary | ICD-10-CM | POA: Diagnosis not present

## 2020-12-06 DIAGNOSIS — M545 Low back pain, unspecified: Secondary | ICD-10-CM

## 2020-12-06 LAB — COMPREHENSIVE METABOLIC PANEL
ALT: 18 U/L (ref 0–44)
AST: 28 U/L (ref 15–41)
Albumin: 3.7 g/dL (ref 3.5–5.0)
Alkaline Phosphatase: 76 U/L (ref 38–126)
Anion gap: 12 (ref 5–15)
BUN: 10 mg/dL (ref 8–23)
CO2: 21 mmol/L — ABNORMAL LOW (ref 22–32)
Calcium: 8.8 mg/dL — ABNORMAL LOW (ref 8.9–10.3)
Chloride: 106 mmol/L (ref 98–111)
Creatinine, Ser: 0.57 mg/dL (ref 0.44–1.00)
GFR, Estimated: >60 mL/min (ref >60)
Glucose, Bld: 81 mg/dL (ref 70–99)
Potassium: 4.2 mmol/L (ref 3.5–5.1)
Sodium: 139 mmol/L (ref 135–145)
Total Bilirubin: 0.4 mg/dL (ref 0.3–1.2)
Total Protein: 6.5 g/dL (ref 6.5–8.1)

## 2020-12-06 LAB — CBC WITH DIFFERENTIAL/PLATELET
Abs Immature Granulocytes: 0.01 10*3/uL (ref 0.00–0.07)
Basophils Absolute: 0 10*3/uL (ref 0.0–0.1)
Basophils Relative: 1 %
Eosinophils Absolute: 0.3 10*3/uL (ref 0.0–0.5)
Eosinophils Relative: 4 %
HCT: 39.2 % (ref 36.0–46.0)
Hemoglobin: 12.7 g/dL (ref 12.0–15.0)
Immature Granulocytes: 0 %
Lymphocytes Relative: 48 %
Lymphs Abs: 3 10*3/uL (ref 0.7–4.0)
MCH: 29.7 pg (ref 26.0–34.0)
MCHC: 32.4 g/dL (ref 30.0–36.0)
MCV: 91.6 fL (ref 80.0–100.0)
Monocytes Absolute: 0.4 10*3/uL (ref 0.1–1.0)
Monocytes Relative: 7 %
Neutro Abs: 2.4 10*3/uL (ref 1.7–7.7)
Neutrophils Relative %: 40 %
Platelets: 174 10*3/uL (ref 150–400)
RBC: 4.28 MIL/uL (ref 3.87–5.11)
RDW: 14.2 % (ref 11.5–15.5)
WBC: 6.1 10*3/uL (ref 4.0–10.5)
nRBC: 0 % (ref 0.0–0.2)

## 2020-12-06 LAB — RESP PANEL BY RT-PCR (FLU A&B, COVID) ARPGX2
Influenza A by PCR: NEGATIVE
Influenza B by PCR: NEGATIVE
SARS Coronavirus 2 by RT PCR: NEGATIVE

## 2020-12-06 MED ORDER — IOHEXOL 300 MG/ML  SOLN
100.0000 mL | Freq: Once | INTRAMUSCULAR | Status: AC | PRN
  Administered 2020-12-06: 100 mL via INTRAVENOUS

## 2020-12-06 MED ORDER — ACETAMINOPHEN 500 MG PO TABS
1000.0000 mg | ORAL_TABLET | Freq: Once | ORAL | Status: AC
  Administered 2020-12-06: 1000 mg via ORAL
  Filled 2020-12-06: qty 2

## 2020-12-06 NOTE — Discharge Instructions (Signed)
Take ibuprofen 3 times a day with meals as needed for pain. Do not take other anti-inflammatories at the same time (Advil, Motrin, naproxen, Aleve). You may supplement with Tylenol if you need further pain control. Use muscle creams or patches such as Salonpas, icy hot, BenGay, Biofreeze to help with pain. Use ice packs or heating pads if this helps control your pain. You will likely have continued muscle stiffness and soreness over the next couple days.  Follow-up with primary care in 1 week if your symptoms are not improving. Return to the emergency room if you develop vision changes, vomiting, slurred speech, numbness, loss of bowel or bladder control, or any new or worsening symptoms.
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
## 2020-12-14 ENCOUNTER — Other Ambulatory Visit: Payer: Self-pay | Admitting: Family Medicine

## 2020-12-14 ENCOUNTER — Ambulatory Visit: Payer: Self-pay

## 2020-12-14 NOTE — Telephone Encounter (Signed)
Medication Refill - Medication: traMADol (ULTRAM) 50 MG tablet    Has the patient contacted their pharmacy? Yes.   (Agent: If no, request that the patient contact the pharmacy for the refill.) (Agent: If yes, when and what did the pharmacy advise?)  Preferred Pharmacy (with phone number or street name):  [id number] - [place], [place] - [address]  [address] [place] [address]  Phone: [phone] Fax: [phone]     Agent: Please be advised that RX refills may take up to 3 business days. We ask that you follow-up with your pharmacy.

## 2020-12-14 NOTE — Telephone Encounter (Signed)
Requested medication (s) are due for refill today:  Yes  Requested medication (s) are on the active medication list:  Yes  Future visit scheduled:  Yes  Last Refill: 10/05/20; #30; RF 0  Notes to clinic: medication is not delegated  Requested Prescriptions  Pending Prescriptions Disp Refills   traMADol (ULTRAM) 50 MG tablet 30 tablet 0    Sig: Take 1 tablet (50 mg total) by mouth 3 (three) times daily as needed.      Not Delegated - Analgesics:  Opioid Agonists Failed - 12/14/2020  3:54 PM      Failed - This refill cannot be delegated      Failed - Urine Drug Screen completed in last 360 days      Passed - Valid encounter within last 6 months    Recent Outpatient Visits           1 month ago Type 2 diabetes mellitus without complication, without long-term current use of insulin (HCC)   [facility] Fulp, [place], MD   5 months ago Type 2 diabetes mellitus without complication, without long-term current use of insulin (HCC)   [facility] [place], [place], MD   9 months ago Essential hypertension   [facility] Mayers, Cari S, [place]   11 months ago Type 2 diabetes mellitus without complication, without long-term current use of insulin (HCC)   [facility] [place], [place] L, RPH-CPP   1 year ago Type 2 diabetes mellitus without complication, without long-term current use of insulin [facility])    [facility] Lois Huxley, Cornelius Moras, RPH-CPP       Future Appointments             In 3 weeks Sharon Seller, Marzella Schlein, PA-C [facility]
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
## 2020-12-16 ENCOUNTER — Ambulatory Visit: Payer: Medicare Other | Admitting: Neurology

## 2020-12-17 MED ORDER — TRAMADOL HCL 50 MG PO TABS: 50.0000 mg | ORAL_TABLET | Freq: Three times a day (TID) | ORAL | 0 refills | Status: DC | PRN
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
## 2021-01-06 ENCOUNTER — Ambulatory Visit: Payer: Medicare Other | Attending: Physician Assistant | Admitting: Physician Assistant

## 2021-01-06 ENCOUNTER — Encounter: Payer: Self-pay | Admitting: Physician Assistant

## 2021-01-06 ENCOUNTER — Other Ambulatory Visit: Payer: Self-pay

## 2021-01-06 DIAGNOSIS — E119 Type 2 diabetes mellitus without complications: Secondary | ICD-10-CM | POA: Diagnosis not present

## 2021-01-06 DIAGNOSIS — G5621 Lesion of ulnar nerve, right upper limb: Secondary | ICD-10-CM

## 2021-01-06 DIAGNOSIS — J309 Allergic rhinitis, unspecified: Secondary | ICD-10-CM | POA: Diagnosis not present

## 2021-01-06 DIAGNOSIS — F418 Other specified anxiety disorders: Secondary | ICD-10-CM | POA: Diagnosis not present

## 2021-01-06 DIAGNOSIS — Z09 Encounter for follow-up examination after completed treatment for conditions other than malignant neoplasm: Secondary | ICD-10-CM

## 2021-01-06 DIAGNOSIS — I10 Essential (primary) hypertension: Secondary | ICD-10-CM | POA: Diagnosis not present

## 2021-01-06 MED ORDER — CETIRIZINE HCL 10 MG PO TABS: 10.0000 mg | ORAL_TABLET | Freq: Every day | ORAL | 1 refills | Status: DC

## 2021-01-06 MED ORDER — CITALOPRAM HYDROBROMIDE 10 MG PO TABS: 10.0000 mg | ORAL_TABLET | Freq: Every day | ORAL | 1 refills | Status: DC

## 2021-01-06 MED ORDER — GABAPENTIN 300 MG PO CAPS: 300.0000 mg | ORAL_CAPSULE | Freq: Three times a day (TID) | ORAL | 1 refills | Status: DC

## 2021-01-06 MED ORDER — FLUTICASONE PROPIONATE 50 MCG/ACT NA SUSP: 2.0000 | Freq: Every day | NASAL | 5 refills | Status: DC | PRN

## 2021-01-06 MED ORDER — AMLODIPINE BESYLATE 10 MG PO TABS: 10.0000 mg | ORAL_TABLET | Freq: Every day | ORAL | 1 refills | Status: DC

## 2021-01-14 ENCOUNTER — Other Ambulatory Visit: Payer: Self-pay

## 2021-01-14 NOTE — Patient Outreach (Signed)
Aging Gracefully Program  01/14/2021  Renee Murray 09/21/1956 [id number]  [facility] Evaluation Interviewer attempted to call patient on today regarding Aging Gracefully referral. No answer from patient after multiple rings. CMA left confidential voicemail.    Will attempt to call back within 1 week.  Baruch Gouty Care Management Assistant [phone]

## 2021-01-19 ENCOUNTER — Other Ambulatory Visit: Payer: Self-pay

## 2021-01-19 NOTE — Patient Outreach (Signed)
Aging Gracefully Program  01/19/2021  Renee Murray October 25, 1956 [id number]  [facility] Evaluation Interviewer attempted to call patient on today regarding Aging Gracefully final evaluation. No answer from patient after multiple rings. CMA left confidential voicemail for return call.  Will attempt to call back within 1 week.  Baruch Gouty Care Management Assistant [phone]

## 2021-01-21 ENCOUNTER — Other Ambulatory Visit: Payer: Self-pay

## 2021-01-21 NOTE — Patient Outreach (Signed)
Aging Gracefully Program  01/21/2021  Renee Murray [birth year] [id number]  [facility] Evaluation Interviewer made contact with patient. Aging Gracefully final survey completed.   Interviewer will send referral to Baldo Ash at [employer].  Baruch Gouty Care Management Assistant [phone]

## 2021-02-01 DIAGNOSIS — Z1211 Encounter for screening for malignant neoplasm of colon: Secondary | ICD-10-CM | POA: Diagnosis not present

## 2021-02-06 LAB — COLOGUARD: Cologuard: POSITIVE — AB

## 2021-02-09 ENCOUNTER — Telehealth: Payer: Self-pay

## 2021-02-09 DIAGNOSIS — R195 Other fecal abnormalities: Secondary | ICD-10-CM

## 2021-02-09 NOTE — Telephone Encounter (Signed)
Copied from CRM [id number]. Topic: Quick Communication - See Telephone Encounter >> Feb 08, 2021 11:28 AM Aretta Nip wrote: CRM for notification. See Telephone encounter for: 02/08/21. Cologuard call...states Abnormal cologuard result Case # [id number] call back # [phone]   Last saw Sharon Seller, please follow up.

## 2021-02-11 NOTE — Telephone Encounter (Signed)
Will route to PCP.  I will call and informed pt of results and referral being placed.

## 2021-05-19 DIAGNOSIS — Z23 Encounter for immunization: Secondary | ICD-10-CM | POA: Diagnosis not present

## 2021-05-30 ENCOUNTER — Ambulatory Visit: Payer: Self-pay

## 2021-05-30 ENCOUNTER — Other Ambulatory Visit: Payer: Self-pay

## 2021-05-30 ENCOUNTER — Ambulatory Visit: Payer: Medicare Other | Admitting: Physician Assistant

## 2021-05-30 VITALS — BP 126/59 | HR 71 | Temp 98.2°F | Resp 18 | Ht 59.0 in | Wt 145.0 lb

## 2021-05-30 DIAGNOSIS — L309 Dermatitis, unspecified: Secondary | ICD-10-CM | POA: Diagnosis not present

## 2021-05-30 DIAGNOSIS — R21 Rash and other nonspecific skin eruption: Secondary | ICD-10-CM | POA: Insufficient documentation

## 2021-05-30 DIAGNOSIS — R7303 Prediabetes: Secondary | ICD-10-CM | POA: Diagnosis not present

## 2021-05-30 DIAGNOSIS — M5431 Sciatica, right side: Secondary | ICD-10-CM | POA: Diagnosis not present

## 2021-05-30 DIAGNOSIS — M5441 Lumbago with sciatica, right side: Secondary | ICD-10-CM

## 2021-05-30 LAB — POCT GLYCOSYLATED HEMOGLOBIN (HGB A1C): Hemoglobin A1C: 5.7 % — AB (ref 4.0–5.6)

## 2021-05-30 MED ORDER — KETOROLAC TROMETHAMINE 30 MG/ML IJ SOLN
30.0000 mg | Freq: Once | INTRAMUSCULAR | Status: AC
  Administered 2021-05-30: 60 mg via INTRAMUSCULAR

## 2021-05-30 MED ORDER — METHYLPREDNISOLONE ACETATE 80 MG/ML IJ SUSP
80.0000 mg | Freq: Once | INTRAMUSCULAR | Status: AC
  Administered 2021-05-30: 80 mg via INTRAMUSCULAR

## 2021-05-30 NOTE — Assessment & Plan Note (Signed)
A1c of 5.7% today. Discussed diet and lifestyle modifications to reduce A1c. Follow up at appointment with PCP on 07/19/2022

## 2021-05-30 NOTE — Assessment & Plan Note (Signed)
Prednisone injection performed today.  Can continue to use calamine lotion on the affected areas. Follow up if worsening.

## 2021-05-30 NOTE — Progress Notes (Signed)
Patient presents with sciatic pain flaring up today. Patient has Patient has Patient presents with a rash over her forearms stating she was working in the yard the past week and has been treating the area with calamine lotion.

## 2021-05-30 NOTE — Telephone Encounter (Signed)
Pt. Started having right hip pain that radiates down her leg. "It's my sciatica.I've been working out in the yard a lot.Nothing is helping it." No availability in the practice today. Pt. Going to mobile unit.

## 2021-05-30 NOTE — Progress Notes (Signed)
Established Patient Office Visit  Subjective:  Patient ID: Renee Murray, female    DOB: 05-27-1956  Age: 65 y.o. MRN: [id number]  CC:  Chief Complaint  Patient presents with   Back Pain    HPI Renee Murray presents for evaluation of right hip and right leg pain. The patient states she has suffered from sciatic nerve pain for years and the pain she is currently experiencing is similar to sciatic nerve pain she has felt in the past. For the past 3 days the patient has been experiencing constant 9-10/10 right hip and right leg pain. The pain is worse when walking, bending and lifting objects from the ground. The patient is able to relieve the pain when she sits down. She has been taking extra strength Tylenol over the past 3 days which has led to some relief. The patient has had previous episodes of sciatic nerve pain treated with Tramadol and Prednisone which has led to relief.   The patient also reports a rash which began 2 days ago. The patient states she was outside in her garden two days ago near a tree which had poison oak growing on it. Since then the patient has been experiencing a red, itchy rash on her right arm, right hand, left ankle, and left eyebrow. The patient has been using calamine lotion regularly on the areas which has led to improvement in her symptoms.   Denies any new medications, fragrances, lotions, detergents.  Past Medical History:  Diagnosis Date   Brain aneurysm    Diabetes mellitus without complication ([place])    Hypertension    Stroke [facility])    stroke like symptons/admitted 8/21    Past Surgical History:  Procedure Laterality Date   ABDOMINAL HYSTERECTOMY     CHOLECYSTECTOMY     CRANIOTOMY Right 02/04/2015   Procedure: RIGHT CRANIOTOMY FOR ANEURYSM ;  Surgeon: Consuella Lose, MD;  Location: [facility] NEURO ORS;  Service: Neurosurgery;  Laterality: Right;   IR GENERIC HISTORICAL  02/04/2015   IR ANGIO INTRA EXTRACRAN SEL COM CAROTID INNOMINATE BILAT MOD  SED 02/04/2015 Consuella Lose, MD [facility]-INTERV RAD   IR GENERIC HISTORICAL  02/04/2015   IR ANGIO VERTEBRAL SEL SUBCLAVIAN INNOMINATE UNI R MOD SED 02/04/2015 Consuella Lose, MD [facility]   IR GENERIC HISTORICAL  02/04/2015   IR ANGIO VERTEBRAL SEL VERTEBRAL UNI L MOD SED 02/04/2015 Consuella Lose, MD [facility]    Family History  Problem Relation Age of Onset   Diabetes Mother    Heart failure Mother    Cancer Father     Social History   Socioeconomic History   Marital status: Divorced    Spouse name: Not on file   Number of children: 3   Years of education: Not on file   Highest education level: High school graduate  Occupational History   Occupation: retired  Tobacco Use   Smoking status: Some Days    Packs/day: 0.00    Years: 20.00    Pack years: 0.00    Types: Cigarettes   Smokeless tobacco: Never   Tobacco comments:    reports 1 pack per month  Vaping Use   Vaping Use: Never used  Substance and Sexual Activity   Alcohol use: Yes    Comment: occasionally   Drug use: No   Sexual activity: Not Currently  Other Topics Concern   Not on file  Social History Narrative   Lives alone   Right handed   Drinks 3-4 cups of  caffeine daily   Social Determinants of Health   Financial Resource Strain: Not on file  Food Insecurity: Not on file  Transportation Needs: Not on file  Physical Activity: Not on file  Stress: Not on file  Social Connections: Not on file  Intimate Partner Violence: Not on file    Outpatient Medications Prior to Visit  Medication Sig Dispense Refill   amLODipine (NORVASC) 10 MG tablet Take 1 tablet (10 mg total) by mouth daily. To lower blood pressure 90 tablet 1   Blood Glucose Monitoring Suppl (ONETOUCH VERIO) w/Device KIT Use to test blood sugar once daily. E11.9 1 kit 0   cetirizine (ZYRTEC) 10 MG tablet Take 1 tablet (10 mg total) by mouth daily. 90 tablet 1   citalopram (CELEXA) 10 MG tablet Take 1 tablet (10 mg total) by mouth  daily. 90 tablet 1   fluticasone (FLONASE) 50 MCG/ACT nasal spray Place 2 sprays into both nostrils daily as needed for allergies or rhinitis. 1 g 5   gabapentin (NEURONTIN) 300 MG capsule Take 1 capsule (300 mg total) by mouth 3 (three) times daily. 270 capsule 1   glucose blood (ONETOUCH VERIO) test strip Use to check blood sugar once daily. E11.9 100 each 6   ibuprofen (ADVIL) 200 MG tablet Take 200-800 mg by mouth every 6 (six) hours as needed (for pain or headaches).     Lancet Devices (ONETOUCH DELICA PLUS LANCING) MISC Use to check blood sugar once daily. E11.9 1 each 6   OneTouch Delica Lancets 35H MISC Test blood sugars twice daily. E11.9 100 each 11   trolamine salicylate (ASPERCREME) 10 % cream Apply 1 application topically as needed for muscle pain (or sciatica).      No facility-administered medications prior to visit.    Allergies  Allergen Reactions   Demerol Other (See Comments)    "I almost died"   Morphine And Related Other (See Comments)    "Almost Died"    ROS Review of Systems  Constitutional:  Negative for fever.  HENT:  Negative for congestion and rhinorrhea.   Eyes:  Negative for pain.  Respiratory:  Negative for cough, shortness of breath and wheezing.   Cardiovascular:  Negative for chest pain and leg swelling.  Gastrointestinal:  Negative for abdominal pain, constipation, diarrhea, nausea and vomiting.  Endocrine: Negative for polyuria.  Genitourinary:  Negative for dysuria.  Musculoskeletal:        Right hip pain. Right posterior thigh pain  Skin:  Positive for rash.  Neurological:  Negative for headaches.     Objective:    Physical Exam Vitals and nursing note reviewed.  Constitutional:      General: She is not in acute distress. HENT:     Head: Normocephalic and atraumatic.  Eyes:     Conjunctiva/sclera: Conjunctivae normal.  Cardiovascular:     Rate and Rhythm: Normal rate and regular rhythm.     Heart sounds: Normal heart sounds. No  murmur heard.   No friction rub. No gallop.  Pulmonary:     Effort: Pulmonary effort is normal. No respiratory distress.     Breath sounds: Normal breath sounds.  Abdominal:     General: There is no distension.     Palpations: Abdomen is soft.     Tenderness: There is no abdominal tenderness.  Musculoskeletal:        General: Tenderness present.     Cervical back: Normal range of motion.     Comments: Tenderness to palpation over  right ASIS. Some tenderness to palpation over the lateral aspect of the right thigh.  Mild reported tenderness with ambulation of the right hip and right thigh.   Skin:    General: Skin is warm.     Findings: Rash (Small erythematous vesicles diffusely on the right forearm, dorsal aspect of the right hand, left eyebrown, and left ankle) present.  Neurological:     Mental Status: She is alert.     Gait: Gait normal.  Psychiatric:        Mood and Affect: Mood normal.        Behavior: Behavior normal.    BP (!) 126/59 (BP Location: Left Arm, Patient Position: Sitting, Cuff Size: Normal)   Pulse 71   Temp 98.2 F (36.8 C) (Oral)   Resp 18   Ht _0  (1.499 m)   Wt 65.8 kg   SpO2 99%   BMI 29.29 kg/m  Wt Readings from Last 3 Encounters:  05/30/21 65.8 kg  12/06/20 65.8 kg  10/21/20 63.6 kg     Health Maintenance Due  Topic Date Due   URINE MICROALBUMIN  Never done   Zoster Vaccines- Shingrix (1 of 2) Never done   COLONOSCOPY (Pts 45-71yr Insurance coverage will need to be confirmed)  Never done   PAP SMEAR-Modifier  08/25/2011   DEXA SCAN  Never done   PNA vac Low Risk Adult (1 of 2 - PCV13) Never done   MAMMOGRAM  02/06/2020   COVID-19 Vaccine (3 - Pfizer risk series) 02/24/2020    There are no preventive care reminders to display for this patient.  Lab Results  Component Value Date   TSH 0.738 08/06/2013   Lab Results  Component Value Date   WBC 6.1 12/06/2020   HGB 12.7 12/06/2020   HCT 39.2 12/06/2020   MCV 91.6 12/06/2020    PLT 174 12/06/2020   Lab Results  Component Value Date   NA 139 12/06/2020   K 4.2 12/06/2020   CO2 21 (L) 12/06/2020   GLUCOSE 81 12/06/2020   BUN 10 12/06/2020   CREATININE 0.57 12/06/2020   BILITOT 0.4 12/06/2020   ALKPHOS 76 12/06/2020   AST 28 12/06/2020   ALT 18 12/06/2020   PROT 6.5 12/06/2020   ALBUMIN 3.7 12/06/2020   CALCIUM 8.8 (L) 12/06/2020   ANIONGAP 12 12/06/2020   Lab Results  Component Value Date   CHOL 160 10/21/2019   Lab Results  Component Value Date   HDL 49 10/21/2019   Lab Results  Component Value Date   LDLCALC 92 10/21/2019   Lab Results  Component Value Date   TRIG 103 10/21/2019   Lab Results  Component Value Date   CHOLHDL 3.3 10/21/2019   Lab Results  Component Value Date   HGBA1C 5.8 (A) 10/21/2020      Assessment & Plan:   Problem List Items Addressed This Visit       Nervous and Auditory   Right sciatic nerve pain    Injection of ketorolac and prednisone today. Advised to use ibuprofen as needed for pain. Discussed stretching exercises to perform regularly once pain resolves to prevent further episodes of pain. Follow up at appointment with PCP on 07/19/2022         Musculoskeletal and Integument   Rash and nonspecific skin eruption    Prednisone injection performed today.  Can continue to use calamine lotion on the affected areas. Follow up if worsening.  Other   Prediabetes    A1c of 5.7% today. Discussed diet and lifestyle modifications to reduce A1c. Follow up at appointment with PCP on 07/19/2022       Relevant Orders   HgB A1c   Other Visit Diagnoses     Acute back pain with sciatica, right    -  Primary   Dermatitis           I have reviewed the patient's medical history (PMH, PSH, Social History, Family History, Medications, and allergies) , and have been updated if relevant. I spent 31 minutes reviewing chart and  face to face time with patient.   No orders of the defined types  were placed in this encounter.   Follow-up: Return in about 7 weeks (around 07/19/2021) for [facility].   Kennieth Rad, PA-C Physician Assistant [place] [email or web address]  [place], [place]

## 2021-05-30 NOTE — Assessment & Plan Note (Signed)
Injection of ketorolac and prednisone today. Advised to use ibuprofen as needed for pain. Discussed stretching exercises to perform regularly once pain resolves to prevent further episodes of pain. Follow up at appointment with PCP on 07/19/2022

## 2021-05-30 NOTE — Telephone Encounter (Signed)
Answer Assessment - Initial Assessment Questions 1. LOCATION and RADIATION: "Where is the pain located?"      Right hip and down the leg 2. QUALITY: "What does the pain feel like?"  (e.g., sharp, dull, aching, burning)     Aching 3. SEVERITY: "How bad is the pain?" "What does it keep you from doing?"   (Scale 1-10; or mild, moderate, severe)   -  MILD (1-3): doesn't interfere with normal activities    -  MODERATE (4-7): interferes with normal activities (e.g., work or school) or awakens from sleep, limping    -  SEVERE (8-10): excruciating pain, unable to do any normal activities, unable to walk     9 4. ONSET: "When did the pain start?" "Does it come and go, or is it there all the time?"     2 days ago 5. WORK OR EXERCISE: "Has there been any recent work or exercise that involved this part of the body?"      Gardening 6. CAUSE: "What do you think is causing the hip pain?"      Sciatica 7. AGGRAVATING FACTORS: "What makes the hip pain worse?" (e.g., walking, climbing stairs, running)     Movement 8. OTHER SYMPTOMS: "Do you have any other symptoms?" (e.g., back pain, pain shooting down leg,  fever, rash)     Pain down leg  Protocols used: Hip Pain-A-AH

## 2021-05-30 NOTE — Patient Instructions (Signed)
I encourage you to use ibuprofen 200 to 800 mg every 6-8 hours as needed to help with your sciatic pain.  Ice and heat can also be very comforting.  I encourage you to start doing some gentle stretching and then once you are feel better start a good stretching regimen for preventative measures.  Make sure you are drinking plenty of water.  Please let us know if there is anything else we can do for you.  Roney Jaffe, PA-C Physician Assistant [facility] [email or web address]   Sciatica Rehab Ask your health care provider which exercises are safe for you. Do exercises exactly as told by your health care provider and adjust them as directed. It is normal to feel mild stretching, pulling, tightness, or discomfort as you do these exercises. Stop right away if you feel sudden pain or your pain gets worse. Do not begin these exercises until told by your health care provider. Stretching and range-of-motion exercises These exercises warm up your muscles and joints and improve the movement and flexibility of your hips and back. These exercises also help to relieve pain,numbness, and tingling. Sciatic nerve glide Sit in a chair with your head facing down toward your chest. Place your hands behind your back. Let your shoulders slump forward. Slowly straighten one of your legs while you tilt your head back as if you are looking toward the ceiling. Only straighten your leg as far as you can without making your symptoms worse. Hold this position for __________ seconds. Slowly return to the starting position. Repeat with your other leg. Repeat __________ times. Complete this exercise __________ times a day. Knee to chest with hip adduction and internal rotation  Lie on your back on a firm surface with both legs straight. Bend one of your knees and move it up toward your chest until you feel a gentle stretch in your lower back and buttock. Then, move  your knee toward the shoulder that is on the opposite side from your leg. This is hip adduction and internal rotation. Hold your leg in this position by holding on to the front of your knee. Hold this position for __________ seconds. Slowly return to the starting position. Repeat with your other leg. Repeat __________ times. Complete this exercise __________ times a day. Prone extension on elbows  Lie on your abdomen on a firm surface. A bed may be too soft for this exercise. Prop yourself up on your elbows. Use your arms to help lift your chest up until you feel a gentle stretch in your abdomen and your lower back. This will place some of your body weight on your elbows. If this is uncomfortable, try stacking pillows under your chest. Your hips should stay down, against the surface that you are lying on. Keep your hip and back muscles relaxed. Hold this position for __________ seconds. Slowly relax your upper body and return to the starting position. Repeat __________ times. Complete this exercise __________ times a day. Strengthening exercises These exercises build strength and endurance in your back. Endurance is theability to use your muscles for a long time, even after they get tired. Pelvic tilt This exercise strengthens the muscles that lie deep in the abdomen. Lie on your back on a firm surface. Bend your knees and keep your feet flat on the floor. Tense your abdominal muscles. Tip your pelvis up toward the ceiling and flatten your lower back into the floor. To help with this exercise, you may place a small towel  under your lower back and try to push your back into the towel. Hold this position for __________ seconds. Let your muscles relax completely before you repeat this exercise. Repeat __________ times. Complete this exercise __________ times a day. Alternating arm and leg raises  Get on your hands and knees on a firm surface. If you are on a hard floor, you may want to use  padding, such as an exercise mat, to cushion your knees. Line up your arms and legs. Your hands should be directly below your shoulders, and your knees should be directly below your hips. Lift your left leg behind you. At the same time, raise your right arm and straighten it in front of you. Do not lift your leg higher than your hip. Do not lift your arm higher than your shoulder. Keep your abdominal and back muscles tight. Keep your hips facing the ground. Do not arch your back. Keep your balance carefully, and do not hold your breath. Hold this position for __________ seconds. Slowly return to the starting position. Repeat with your right leg and your left arm. Repeat __________ times. Complete this exercise __________ times a day. Posture and body mechanics Good posture and healthy body mechanics can help to relieve stress in your body's tissues and joints. Body mechanics refers to the movements and positions of your body while you do your daily activities. Posture is part of body mechanics. Good posture means: Your spine is in its natural S-curve position (neutral). Your shoulders are pulled back slightly. Your head is not tipped forward. Follow these guidelines to improve your posture and body mechanics in youreveryday activities. Standing  When standing, keep your spine neutral and your feet about hip width apart. Keep a slight bend in your knees. Your ears, shoulders, and hips should line up. When you do a task in which you stand in one place for a long time, place one foot up on a stable object that is 2-4 inches (5-10 cm) high, such as a footstool. This helps keep your spine neutral.  Sitting  When sitting, keep your spine neutral and keep your feet flat on the floor. Use a footrest, if necessary, and keep your thighs parallel to the floor. Avoid rounding your shoulders, and avoid tilting your head forward. When working at a desk or a computer, keep your desk at a height where  your hands are slightly lower than your elbows. Slide your chair under your desk so you are close enough to maintain good posture. When working at a computer, place your monitor at a height where you are looking straight ahead and you do not have to tilt your head forward or downward to look at the screen.  Resting When lying down and resting, avoid positions that are most painful for you. If you have pain with activities such as sitting, bending, stooping, or squatting, lie in a position in which your body does not bend very much. For example, avoid curling up on your side with your arms and knees near your chest (fetal position). If you have pain with activities such as standing for a long time or reaching with your arms, lie with your spine in a neutral position and bend your knees slightly. Try the following positions: Lying on your side with a pillow between your knees. Lying on your back with a pillow under your knees. Lifting  When lifting objects, keep your feet at least shoulder width apart and tighten your abdominal muscles. Bend your knees and  hips and keep your spine neutral. It is important to lift using the strength of your legs, not your back. Do not lock your knees straight out. Always ask for help to lift heavy or awkward objects.  This information is not intended to replace advice given to you by your health care provider. Make sure you discuss any questions you have with your healthcare provider. Document Revised: 03/28/2019 Document Reviewed: 12/26/2018 Elsevier Patient Education  2022 [employer].

## 2021-05-31 ENCOUNTER — Ambulatory Visit: Payer: Medicare Other | Admitting: Physician Assistant

## 2021-05-31 VITALS — BP 128/54 | HR 78 | Temp 98.7°F | Resp 18 | Ht <= 58 in | Wt 145.0 lb

## 2021-05-31 DIAGNOSIS — L309 Dermatitis, unspecified: Secondary | ICD-10-CM | POA: Insufficient documentation

## 2021-05-31 DIAGNOSIS — M5441 Lumbago with sciatica, right side: Secondary | ICD-10-CM | POA: Insufficient documentation

## 2021-05-31 DIAGNOSIS — L03116 Cellulitis of left lower limb: Secondary | ICD-10-CM

## 2021-05-31 MED ORDER — SULFAMETHOXAZOLE-TRIMETHOPRIM 800-160 MG PO TABS: 1.0000 | ORAL_TABLET | Freq: Two times a day (BID) | ORAL | 0 refills | Status: AC

## 2021-05-31 NOTE — Progress Notes (Signed)
Established Patient Office Visit  Subjective:  Patient ID: Renee Murray, female    DOB: 1956/01/17  Age: 65 y.o. MRN: [id number]  CC:  Chief Complaint  Patient presents with   Rash    Left leg swelling      HPI Renee Murray reports that she has been having swelling and warmth on her left inner thigh right above her knee.  Reports that she thought it was part of her poison oak but does not remember being "bitten or stung by something" while she was working in her yard.  Reports that she had put calamine lotion on it previously, and then after it started swelling yesterday she started using Neosporin.  Denies pruritus, fever, tachycardia.  Past Medical History:  Diagnosis Date   Brain aneurysm    Diabetes mellitus without complication ([place])    Hypertension    Stroke [facility])    stroke like symptons/admitted 8/21    Past Surgical History:  Procedure Laterality Date   ABDOMINAL HYSTERECTOMY     CHOLECYSTECTOMY     CRANIOTOMY Right 02/04/2015   Procedure: RIGHT CRANIOTOMY FOR ANEURYSM ;  Surgeon: Consuella Lose, MD;  Location: [facility];  Service: Neurosurgery;  Laterality: Right;   IR GENERIC HISTORICAL  02/04/2015   IR ANGIO INTRA EXTRACRAN SEL COM CAROTID INNOMINATE BILAT MOD SED 02/04/2015 Consuella Lose, MD [facility]   IR GENERIC HISTORICAL  02/04/2015   IR ANGIO VERTEBRAL SEL SUBCLAVIAN INNOMINATE UNI R MOD SED 02/04/2015 Consuella Lose, MD [facility]   IR GENERIC HISTORICAL  02/04/2015   IR ANGIO VERTEBRAL SEL VERTEBRAL UNI L MOD SED 02/04/2015 Consuella Lose, MD [facility]    Family History  Problem Relation Age of Onset   Diabetes Mother    Heart failure Mother    Cancer Father     Social History   Socioeconomic History   Marital status: Divorced    Spouse name: Not on file   Number of children: 3   Years of education: Not on file   Highest education level: High school graduate  Occupational History   Occupation: retired  Tobacco Use    Smoking status: Some Days    Packs/day: 0.00    Years: 20.00    Pack years: 0.00    Types: Cigarettes   Smokeless tobacco: Never   Tobacco comments:    reports 1 pack per month  Vaping Use   Vaping Use: Never used  Substance and Sexual Activity   Alcohol use: Yes    Comment: occasionally   Drug use: No   Sexual activity: Not Currently  Other Topics Concern   Not on file  Social History Narrative   Lives alone   Right handed   Drinks 3-4 cups of caffeine daily   Social Determinants of Health   Financial Resource Strain: Not on file  Food Insecurity: Not on file  Transportation Needs: Not on file  Physical Activity: Not on file  Stress: Not on file  Social Connections: Not on file  Intimate Partner Violence: Not on file    Outpatient Medications Prior to Visit  Medication Sig Dispense Refill   amLODipine (NORVASC) 10 MG tablet Take 1 tablet (10 mg total) by mouth daily. To lower blood pressure 90 tablet 1   Blood Glucose Monitoring Suppl (ONETOUCH VERIO) w/Device KIT Use to test blood sugar once daily. E11.9 1 kit 0   cetirizine (ZYRTEC) 10 MG tablet Take 1 tablet (10 mg total) by mouth daily. Erda  tablet 1   citalopram (CELEXA) 10 MG tablet Take 1 tablet (10 mg total) by mouth daily. 90 tablet 1   fluticasone (FLONASE) 50 MCG/ACT nasal spray Place 2 sprays into both nostrils daily as needed for allergies or rhinitis. 1 g 5   gabapentin (NEURONTIN) 300 MG capsule Take 1 capsule (300 mg total) by mouth 3 (three) times daily. 270 capsule 1   glucose blood (ONETOUCH VERIO) test strip Use to check blood sugar once daily. E11.9 100 each 6   ibuprofen (ADVIL) 200 MG tablet Take 200-800 mg by mouth every 6 (six) hours as needed (for pain or headaches).     Lancet Devices (ONETOUCH DELICA PLUS LANCING) MISC Use to check blood sugar once daily. E11.9 1 each 6   OneTouch Delica Lancets 78M MISC Test blood sugars twice daily. E11.9 100 each 11   trolamine salicylate (ASPERCREME) 10 %  cream Apply 1 application topically as needed for muscle pain (or sciatica).      No facility-administered medications prior to visit.    Allergies  Allergen Reactions   Demerol Other (See Comments)    "I almost died"   Morphine And Related Other (See Comments)    "Almost Died"    ROS Review of Systems  Constitutional:  Negative for chills and fever.  HENT: Negative.    Eyes: Negative.   Respiratory:  Negative for shortness of breath.   Cardiovascular:  Negative for chest pain.  Gastrointestinal:  Negative for nausea and vomiting.  Endocrine: Negative.   Genitourinary: Negative.   Musculoskeletal: Negative.   Skin:  Positive for color change and rash.  Allergic/Immunologic: Negative.   Neurological: Negative.   Hematological: Negative.   Psychiatric/Behavioral: Negative.       Objective:    Physical Exam Vitals and nursing note reviewed.  Constitutional:      Appearance: Normal appearance.  HENT:     Head: Normocephalic and atraumatic.     Right Ear: External ear normal.     Left Ear: External ear normal.     Nose: Nose normal.     Mouth/Throat:     Mouth: Mucous membranes are moist.     Pharynx: Oropharynx is clear.  Eyes:     Extraocular Movements: Extraocular movements intact.     Conjunctiva/sclera: Conjunctivae normal.     Pupils: Pupils are equal, round, and reactive to light.  Cardiovascular:     Rate and Rhythm: Normal rate and regular rhythm.     Pulses: Normal pulses.     Heart sounds: Normal heart sounds.  Pulmonary:     Effort: Pulmonary effort is normal.     Breath sounds: Normal breath sounds.  Musculoskeletal:     Cervical back: Normal range of motion and neck supple.  Skin:    Comments: See photos  Neurological:     General: No focal deficit present.     Mental Status: She is alert and oriented to person, place, and time.  Psychiatric:        Mood and Affect: Mood normal.        Behavior: Behavior normal.        Thought Content:  Thought content normal.        Judgment: Judgment normal.       BP (!) 128/54 (BP Location: Left Arm, Patient Position: Sitting, Cuff Size: Normal)   Pulse 78   Temp 98.7 F (37.1 C) (Oral)   Resp 18   Ht 4' 10"  (1.473 m)   Wt  145 lb (65.8 kg)   SpO2 100%   BMI 30.31 kg/m  Wt Readings from Last 3 Encounters:  05/31/21 145 lb (65.8 kg)  05/30/21 145 lb (65.8 kg)  12/06/20 145 lb (65.8 kg)     Health Maintenance Due  Topic Date Due   URINE MICROALBUMIN  Never done   Zoster Vaccines- Shingrix (1 of 2) Never done   COLONOSCOPY (Pts 45-62yr Insurance coverage will need to be confirmed)  Never done   PAP SMEAR-Modifier  08/25/2011   DEXA SCAN  Never done   PNA vac Low Risk Adult (1 of 2 - PCV13) Never done   MAMMOGRAM  02/06/2020   COVID-19 Vaccine (3 - Pfizer risk series) 02/24/2020    There are no preventive care reminders to display for this patient.  Lab Results  Component Value Date   TSH 0.738 08/06/2013   Lab Results  Component Value Date   WBC 6.1 12/06/2020   HGB 12.7 12/06/2020   HCT 39.2 12/06/2020   MCV 91.6 12/06/2020   PLT 174 12/06/2020   Lab Results  Component Value Date   NA 139 12/06/2020   K 4.2 12/06/2020   CO2 21 (L) 12/06/2020   GLUCOSE 81 12/06/2020   BUN 10 12/06/2020   CREATININE 0.57 12/06/2020   BILITOT 0.4 12/06/2020   ALKPHOS 76 12/06/2020   AST 28 12/06/2020   ALT 18 12/06/2020   PROT 6.5 12/06/2020   ALBUMIN 3.7 12/06/2020   CALCIUM 8.8 (L) 12/06/2020   ANIONGAP 12 12/06/2020   Lab Results  Component Value Date   CHOL 160 10/21/2019   Lab Results  Component Value Date   HDL 49 10/21/2019   Lab Results  Component Value Date   LDLCALC 92 10/21/2019   Lab Results  Component Value Date   TRIG 103 10/21/2019   Lab Results  Component Value Date   CHOLHDL 3.3 10/21/2019   Lab Results  Component Value Date   HGBA1C 5.7 (A) 05/30/2021      Assessment & Plan:   Problem List Items Addressed This Visit    None Visit Diagnoses     Cellulitis of left lower extremity    -  Primary   Relevant Medications   sulfamethoxazole-trimethoprim (BACTRIM DS) 800-160 MG tablet      1. Cellulitis of left lower extremity Patient was seen in the clinic yesterday for right-sided low back pain with sciatica, and poison oak dermatitis.  Patient was given injection of steroids.  This area was not erythematic or swollen at that time.  Trial Bactrim, red flags given for prompt reevaluation.  - sulfamethoxazole-trimethoprim (BACTRIM DS) 800-160 MG tablet; Take 1 tablet by mouth 2 (two) times daily for 5 days.  Dispense: 10 tablet; Refill: 0  Meds ordered this encounter  Medications   sulfamethoxazole-trimethoprim (BACTRIM DS) 800-160 MG tablet    Sig: Take 1 tablet by mouth 2 (two) times daily for 5 days.    Dispense:  10 tablet    Refill:  0    Order Specific Question:   Supervising Provider    Answer:   WElsie Stain[[id number]]    I have reviewed the patient's medical history (PMH, PSH, Social History, Family History, Medications, and allergies) , and have been updated if relevant. I spent 23 minutes reviewing chart and  face to face time with patient.   Follow-up: Return if symptoms worsen or fail to improve.    CLoraine GripMayers, PA-C

## 2021-05-31 NOTE — Progress Notes (Signed)
Patient follows up on left inner thigh redness, tenderness and bite area Patient woke up this morning with the "angry red" area with pain and oozing from the site.

## 2021-05-31 NOTE — Patient Instructions (Signed)
You will take Bactrim twice a day for the next 5 days.  I encourage you to use ibuprofen as needed to help with  pain, you can also use ice to help with the swelling.  Avoid using anything on it except for the Neosporin.  Please let us know if there is anything else we can do for you.  Roney Jaffe, PA-C Physician Assistant [facility] [email or web address]    Cellulitis, Adult  Cellulitis is a skin infection. The infected area is usually warm, red, swollen, and tender. This condition occurs most often in the arms and lower legs. The infection can travel to the muscles, blood, and underlying tissue andbecome serious. It is very important to get treated for this condition. What are the causes? Cellulitis is caused by bacteria. The bacteria enter through a break in theskin, such as a cut, burn, insect bite, open sore, or crack. What increases the risk? This condition is more likely to occur in people who: Have a weak body defense system (immune system). Have open wounds on the skin, such as cuts, burns, bites, and scrapes. Bacteria can enter the body through these open wounds. Are older than 67 years of age. Have diabetes. Have a type of long-lasting (chronic) liver disease (cirrhosis) or kidney disease. Are obese. Have a skin condition such as: Itchy rash (eczema). Slow movement of blood in the veins (venous stasis). Fluid buildup below the skin (edema). Have had radiation therapy. Use IV drugs. What are the signs or symptoms? Symptoms of this condition include: Redness, streaking, or spotting on the skin. Swollen area of the skin. Tenderness or pain when an area of the skin is touched. Warm skin. A fever. Chills. Blisters. How is this diagnosed? This condition is diagnosed based on a medical history and physical exam. You may also have tests, including: Blood tests. Imaging tests. How is this treated? Treatment for  this condition may include: Medicines, such as antibiotic medicines or medicines to treat allergies (antihistamines). Supportive care, such as rest and application of cold or warm cloths (compresses) to the skin. Hospital care, if the condition is severe. The infection usually starts to get better within 1-2 days of treatment. Follow these instructions at home:  Medicines Take over-the-counter and prescription medicines only as told by your health care provider. If you were prescribed an antibiotic medicine, take it as told by your health care provider. Do not stop taking the antibiotic even if you start to feel better. General instructions Drink enough fluid to keep your urine pale yellow. Do not touch or rub the infected area. Raise (elevate) the infected area above the level of your heart while you are sitting or lying down. Apply warm or cold compresses to the affected area as told by your health care provider. Keep all follow-up visits as told by your health care provider. This is important. These visits let your health care provider make sure a more serious infection is not developing. Contact a health care provider if: You have a fever. Your symptoms do not begin to improve within 1-2 days of starting treatment. Your bone or joint underneath the infected area becomes painful after the skin has healed. Your infection returns in the same area or another area. You notice a swollen bump in the infected area. You develop new symptoms. You have a general ill feeling (malaise) with muscle aches and pains. Get help right away if: Your symptoms get worse. You feel very sleepy. You develop vomiting or diarrhea  that persists. You notice red streaks coming from the infected area. Your red area gets larger or turns dark in color. These symptoms may represent a serious problem that is an emergency. Do not wait to see if the symptoms will go away. Get medical help right away. Call your local  emergency services (911 in the [place] Do not drive yourself to the hospital. Summary Cellulitis is a skin infection. This condition occurs most often in the arms and lower legs. Treatment for this condition may include medicines, such as antibiotic medicines or antihistamines. Take over-the-counter and prescription medicines only as told by your health care provider. If you were prescribed an antibiotic medicine, do not stop taking the antibiotic even if you start to feel better. Contact a health care provider if your symptoms do not begin to improve within 1-2 days of starting treatment or your symptoms get worse. Keep all follow-up visits as told by your health care provider. This is important. These visits let your health care provider make sure that a more serious infection is not developing. This information is not intended to replace advice given to you by your health care provider. Make sure you discuss any questions you have with your healthcare provider. Document Revised: 12/15/2019 Document Reviewed: 04/25/2018 Elsevier Patient Education  2022 [employer].

## 2021-06-01 DIAGNOSIS — L03116 Cellulitis of left lower limb: Secondary | ICD-10-CM | POA: Insufficient documentation

## 2021-07-19 ENCOUNTER — Ambulatory Visit: Payer: Medicare Other | Attending: Internal Medicine | Admitting: Internal Medicine

## 2021-07-19 ENCOUNTER — Other Ambulatory Visit: Payer: Self-pay

## 2021-07-19 VITALS — BP 133/73 | HR 67 | Resp 16 | Wt 143.2 lb

## 2021-07-19 DIAGNOSIS — G894 Chronic pain syndrome: Secondary | ICD-10-CM | POA: Diagnosis not present

## 2021-07-19 DIAGNOSIS — Z833 Family history of diabetes mellitus: Secondary | ICD-10-CM | POA: Insufficient documentation

## 2021-07-19 DIAGNOSIS — Z79899 Other long term (current) drug therapy: Secondary | ICD-10-CM | POA: Diagnosis not present

## 2021-07-19 DIAGNOSIS — Z7901 Long term (current) use of anticoagulants: Secondary | ICD-10-CM | POA: Diagnosis not present

## 2021-07-19 DIAGNOSIS — Z720 Tobacco use: Secondary | ICD-10-CM | POA: Diagnosis not present

## 2021-07-19 DIAGNOSIS — F17210 Nicotine dependence, cigarettes, uncomplicated: Secondary | ICD-10-CM | POA: Insufficient documentation

## 2021-07-19 DIAGNOSIS — Z1231 Encounter for screening mammogram for malignant neoplasm of breast: Secondary | ICD-10-CM

## 2021-07-19 DIAGNOSIS — E119 Type 2 diabetes mellitus without complications: Secondary | ICD-10-CM | POA: Insufficient documentation

## 2021-07-19 DIAGNOSIS — Z2821 Immunization not carried out because of patient refusal: Secondary | ICD-10-CM | POA: Diagnosis not present

## 2021-07-19 DIAGNOSIS — E1159 Type 2 diabetes mellitus with other circulatory complications: Secondary | ICD-10-CM | POA: Diagnosis not present

## 2021-07-19 DIAGNOSIS — I10 Essential (primary) hypertension: Secondary | ICD-10-CM

## 2021-07-19 DIAGNOSIS — Z809 Family history of malignant neoplasm, unspecified: Secondary | ICD-10-CM | POA: Diagnosis not present

## 2021-07-19 DIAGNOSIS — Z1211 Encounter for screening for malignant neoplasm of colon: Secondary | ICD-10-CM

## 2021-07-19 DIAGNOSIS — E1140 Type 2 diabetes mellitus with diabetic neuropathy, unspecified: Secondary | ICD-10-CM | POA: Insufficient documentation

## 2021-07-19 MED ORDER — NICOTINE 7 MG/24HR TD PT24
7.0000 mg | MEDICATED_PATCH | Freq: Every day | TRANSDERMAL | 1 refills | Status: DC
Start: 2021-07-19 — End: 2023-03-05

## 2021-07-19 NOTE — Progress Notes (Signed)
Patient ID: Renee Murray, female    DOB: [birth year]  MRN: [id number]  CC: re-establish   Subjective: Renee Murray is a 65 y.o. female who presents for chronic ds management.  Previous PCP is Dr. Chapman Fitch who is no longer with the practice. Her concerns today include:  DM, HTN, SAH (aneurysm) 2016, tob dep, chronic pain, mod/sev CTS RT (EMG)  HYPERTENSION Currently taking: see medication list.  On Amlodipine Med Adherence: [x]  Yes    []  No Medication side effects: []  Yes    [x]  No Adherence with salt restriction: [x]  Yes    []  No Home Monitoring?: [x]  Yes    []  No Monitoring Frequency:  occasionally Home BP results range:  SOB? []  Yes    [x]  No Chest Pain?: []  Yes    [x]  No Leg swelling?: []  Yes    [x]  No Headaches?: []  Yes    [x]  No Dizziness? []  Yes    [x]  No Comments: finds that she sweats a lot when outside doing things like watering flowers, cutting trees etc.  No wgh changes or palpitations  DM:  diet control.  Last A1C 5.7 done 05/30/2021.  Checks BS once a day in the a.m.  Usually less than 130  Eats healthy and smaller portions Very active working in yard a lot almost daily.  Cuts her own lawn with a push mower.  Does a lot of stretching.    Tob dep:  smokes 1/2pk/wk. Smoked off and on since age 38.  Quit during 3 pregnancies. Wants to quit.  Has nicotine patches at home in her closet that are several years old.  She thinks they are outdated.  She never used them.  Also chewing gum helps    HM: decline Prevnar 20.  Had hysterectomy for fibroids.  Due for MMG.  Reports having turned in Cologuard last yr.  We did not receive results. Patient Active Problem List   Diagnosis Date Noted   Cellulitis of left lower extremity 06/01/2021   Acute back pain with sciatica, right 05/31/2021   Dermatitis 05/31/2021   Right sciatic nerve pain 05/30/2021   Rash and nonspecific skin eruption 05/30/2021   Acute midline low back pain 12/27/2016   Acute pain of both shoulders  12/27/2016   Rotator cuff impingement syndrome 12/27/2016   Essential hypertension 01/25/2016   Chronic pain syndrome 01/25/2016   Prediabetes 10/13/2015   Anxiety about health 09/13/2015   Neuropathy 09/13/2015   Chronic generalized pain 09/13/2015   History of seasonal allergies 09/13/2015   SAH (subarachnoid hemorrhage) ([place])    Subarachnoid hemorrhage ([place]) 02/04/2015   History of sinus disease on CT 08/10/2014   Hypertension, goal below 150/90 03/17/2014   Health care maintenance 03/17/2014   Tobacco abuse 03/17/2014   Neuropathy, upper extremity bilaterally  08/06/2013     Current Outpatient Medications on File Prior to Visit  Medication Sig Dispense Refill   amLODipine (NORVASC) 10 MG tablet Take 1 tablet (10 mg total) by mouth daily. To lower blood pressure 90 tablet 1   Blood Glucose Monitoring Suppl (ONETOUCH VERIO) w/Device KIT Use to test blood sugar once daily. E11.9 1 kit 0   cetirizine (ZYRTEC) 10 MG tablet Take 1 tablet (10 mg total) by mouth daily. 90 tablet 1   citalopram (CELEXA) 10 MG tablet Take 1 tablet (10 mg total) by mouth daily. 90 tablet 1   fluticasone (FLONASE) 50 MCG/ACT nasal spray Place 2 sprays into both nostrils daily as needed for allergies  or rhinitis. 1 g 5   gabapentin (NEURONTIN) 300 MG capsule Take 1 capsule (300 mg total) by mouth 3 (three) times daily. 270 capsule 1   glucose blood (ONETOUCH VERIO) test strip Use to check blood sugar once daily. E11.9 100 each 6   ibuprofen (ADVIL) 200 MG tablet Take 200-800 mg by mouth every 6 (six) hours as needed (for pain or headaches).     Lancet Devices (ONETOUCH DELICA PLUS LANCING) MISC Use to check blood sugar once daily. E11.9 1 each 6   OneTouch Delica Lancets 88K MISC Test blood sugars twice daily. E11.9 100 each 11   trolamine salicylate (ASPERCREME) 10 % cream Apply 1 application topically as needed for muscle pain (or sciatica).      No current facility-administered medications on file prior to  visit.    Allergies  Allergen Reactions   Demerol Other (See Comments)    "I almost died"   Morphine And Related Other (See Comments)    "Almost Died"    Social History   Socioeconomic History   Marital status: Divorced    Spouse name: Not on file   Number of children: 3   Years of education: Not on file   Highest education level: High school graduate  Occupational History   Occupation: retired  Tobacco Use   Smoking status: Some Days    Packs/day: 0.00    Years: 20.00    Pack years: 0.00    Types: Cigarettes   Smokeless tobacco: Never   Tobacco comments:    reports 1 pack per month  Vaping Use   Vaping Use: Never used  Substance and Sexual Activity   Alcohol use: Yes    Comment: occasionally   Drug use: No   Sexual activity: Not Currently  Other Topics Concern   Not on file  Social History Narrative   Lives alone   Right handed   Drinks 3-4 cups of caffeine daily   Social Determinants of Health   Financial Resource Strain: Not on file  Food Insecurity: Not on file  Transportation Needs: Not on file  Physical Activity: Not on file  Stress: Not on file  Social Connections: Not on file  Intimate Partner Violence: Not on file    Family History  Problem Relation Age of Onset   Diabetes Mother    Heart failure Mother    Cancer Father     Past Surgical History:  Procedure Laterality Date   ABDOMINAL HYSTERECTOMY     CHOLECYSTECTOMY     CRANIOTOMY Right 02/04/2015   Procedure: RIGHT CRANIOTOMY FOR ANEURYSM ;  Surgeon: Consuella Lose, MD;  Location: [facility];  Service: Neurosurgery;  Laterality: Right;   IR GENERIC HISTORICAL  02/04/2015   IR ANGIO INTRA EXTRACRAN SEL COM CAROTID INNOMINATE BILAT MOD SED 02/04/2015 Consuella Lose, MD [facility]   IR GENERIC HISTORICAL  02/04/2015   IR ANGIO VERTEBRAL SEL SUBCLAVIAN INNOMINATE UNI R MOD SED 02/04/2015 Consuella Lose, MD [facility]   IR GENERIC HISTORICAL  02/04/2015   IR ANGIO VERTEBRAL SEL  VERTEBRAL UNI L MOD SED 02/04/2015 Consuella Lose, MD [facility]    ROS: Review of Systems Negative except as stated above  PHYSICAL EXAM: BP 133/73   Pulse 67   Resp 16   Wt 143 lb 3.2 oz (65 kg)   SpO2 97%   BMI 29.93 kg/m   Wt Readings from Last 3 Encounters:  07/19/21 143 lb 3.2 oz (65 kg)  05/31/21 145 lb (  65.8 kg)  05/30/21 145 lb (65.8 kg)    Physical Exam  General appearance - alert, well appearing, and in no distress Mental status - normal mood, behavior, speech, dress, motor activity, and thought processes Neck - supple, no significant adenopathy Chest - clear to auscultation, no wheezes, rales or rhonchi, symmetric air entry Heart - normal rate, regular rhythm, normal S1, S2, no murmurs, rubs, clicks or gallops Extremities - peripheral pulses normal, no pedal edema, no clubbing or cyanosis  Depression screen [facility] 2/9 07/19/2021 05/30/2021 01/21/2021  Decreased Interest 2 0 0  Down, Depressed, Hopeless 2 0 0  PHQ - 2 Score 4 0 0  Altered sleeping 2 2 -  Tired, decreased energy 2 1 -  Change in appetite 2 0 -  Feeling bad or failure about yourself  0 0 -  Trouble concentrating 0 0 -  Moving slowly or fidgety/restless 0 2 -  Suicidal thoughts 0 0 -  PHQ-9 Score 10 5 -  Some recent data might be hidden    CMP Latest Ref Rng & Units 12/06/2020 07/22/2020 07/22/2020  Glucose 70 - 99 mg/dL 81 125(H) 124(H)  BUN 8 - 23 mg/dL 10 10 10   Creatinine 0.44 - 1.00 mg/dL 0.57 0.60 0.72  Sodium 135 - 145 mmol/L 139 143 139  Potassium 3.5 - 5.1 mmol/L 4.2 3.5 3.7  Chloride 98 - 111 mmol/L 106 103 105  CO2 22 - 32 mmol/L 21(L) - 26  Calcium 8.9 - 10.3 mg/dL 8.8(L) - 8.9  Total Protein 6.5 - 8.1 g/dL 6.5 - 6.3(L)  Total Bilirubin 0.3 - 1.2 mg/dL 0.4 - 0.4  Alkaline Phos 38 - 126 U/L 76 - 73  AST 15 - 41 U/L 28 - 21  ALT 0 - 44 U/L 18 - 14   Lipid Panel     Component Value Date/Time   CHOL 160 10/21/2019 0939   TRIG 103 10/21/2019 0939   HDL 49 10/21/2019 0939    CHOLHDL 3.3 10/21/2019 0939   CHOLHDL 4.3 03/27/2017 1016   VLDL 26 03/27/2017 1016   LDLCALC 92 10/21/2019 0939    CBC    Component Value Date/Time   WBC 6.1 12/06/2020 1407   RBC 4.28 12/06/2020 1407   HGB 12.7 12/06/2020 1407   HCT 39.2 12/06/2020 1407   PLT 174 12/06/2020 1407   MCV 91.6 12/06/2020 1407   MCH 29.7 12/06/2020 1407   MCHC 32.4 12/06/2020 1407   RDW 14.2 12/06/2020 1407   LYMPHSABS 3.0 12/06/2020 1407   MONOABS 0.4 12/06/2020 1407   EOSABS 0.3 12/06/2020 1407   BASOSABS 0.0 12/06/2020 1407    ASSESSMENT AND PLAN:  1. Type 2 diabetes mellitus with other circulatory complications (HCC) Doing well and recent A1c at goal.  Encouraged her to continue healthy eating habits and stay active.  She is agreeable to referral for her diabetic eye exam. - Microalbumin / creatinine urine ratio - Hepatic Function Panel - Lipid panel - Ambulatory referral to Ophthalmology  2. Essential hypertension Close to goal on amlodipine.  Continue current medication and low-salt diet.  3. Tobacco abuse Pt is current smoker. Patient advised to quit smoking. Discussed health risks associated with smoking including lung and other types of cancers, chronic lung diseases and CV risks.. Pt ready to give trail of quitting.   Discussed methods to help quit including quitting cold [place], use of NRT, Chantix and Bupropion.  Pt wanting to try: nicotine patches.  I have prescribed a 7 mg  patch.  I went over with her how to use it.  Encouraged her to set a quit date. _3_ Minutes spent on counseling. F/U: Reassess progress on subsequent visit. - nicotine (NICODERM CQ - DOSED IN MG/24 HR) 7 mg/24hr patch; Place 1 patch (7 mg total) onto the skin daily.  Dispense: 28 patch; Refill: 1  4. Encounter for screening mammogram for malignant neoplasm of breast Referral submitted for mammogram.  5. Pneumococcal vaccination declined Strongly recommended.  Patient declined.  6. Screening for colon  cancer I will have my CMA call [facility] to see if they had received the Cologuard test from her and if so to provide [place] with the results so that we can update her health maintenance.    Patient was given the opportunity to ask questions.  Patient verbalized understanding of the plan and was able to repeat key elements of the plan.   No orders of the defined types were placed in this encounter.    Requested Prescriptions    No prescriptions requested or ordered in this encounter    No follow-ups on file.  Karle Plumber, MD, FACP

## 2021-07-20 ENCOUNTER — Telehealth: Payer: Self-pay

## 2021-07-20 ENCOUNTER — Telehealth: Payer: Self-pay | Admitting: Internal Medicine

## 2021-07-20 DIAGNOSIS — R195 Other fecal abnormalities: Secondary | ICD-10-CM

## 2021-07-20 LAB — HEPATIC FUNCTION PANEL
ALT: 14 IU/L (ref 0–32)
AST: 17 IU/L (ref 0–40)
Albumin: 4.4 g/dL (ref 3.8–4.8)
Alkaline Phosphatase: 85 IU/L (ref 44–121)
Bilirubin Total: <0.2 mg/dL (ref 0.0–1.2)
Bilirubin, Direct: <0.1 mg/dL (ref 0.00–0.40)
Total Protein: 6.5 g/dL (ref 6.0–8.5)

## 2021-07-20 LAB — LIPID PANEL
Chol/HDL Ratio: 3.7 ratio (ref 0.0–4.4)
Cholesterol, Total: 155 mg/dL (ref 100–199)
HDL: 42 mg/dL (ref >39)
LDL Chol Calc (NIH): 71 mg/dL (ref 0–99)
Triglycerides: 258 mg/dL — ABNORMAL HIGH (ref 0–149)
VLDL Cholesterol Cal: 42 mg/dL — ABNORMAL HIGH (ref 5–40)

## 2021-07-20 LAB — MICROALBUMIN / CREATININE URINE RATIO
Creatinine, Urine: 68.8 mg/dL
Microalb/Creat Ratio: 13 mg/g creat (ref 0–29)
Microalbumin, Urine: 8.6 ug/mL

## 2021-07-20 NOTE — Telephone Encounter (Signed)
Please phone call to [facility] this morning to inquire about the results of patient's Cologuard test that she states she did in the fall of last year.  I was told that the test was positive.  I gave them our fax number so that the results can be faxed to [place]. Phone call placed to patient this morning.  I left voicemail message informing her of who I am and that I was calling her about the results of her Cologuard test.  I requested that she call us back and asked to speak with my medical assistant who will go over the results with her.  I also note in the chart that Dr. Alvis Lemmings had submitted a referral to the gastroenterologist in February of this year for positive Cologuard test.  They had tried calling the patient but was unable to reach her so the referral was closed.  Addendum: Patient called back.  I informed her of the positive Cologuard test and that she was referred to the gastroenterologist earlier this year.  Patient states she was aware but did not set up the appointment with the gastroenterologist because she had no one to go with her to have the colonoscopy done.  She asked about what a positive Cologuard test means.  I went over this with her that it is a screening test.  Because it is positive, we do recommend moving forward with having a colonoscopy so that the colon can be examined for any polyps or abnormal lesions.  If any of these are found, the gastroenterologist removes or biopsies to be examined for any cancerous cells.  Patient states she will arrange for someone to go with her to have the colonoscopy done and is agreeable for me to resubmit the referral to the [facility] gastroenterology.

## 2021-07-20 NOTE — Telephone Encounter (Signed)
Contacted pt to go over lab results pt is aware and doesn't have any questions or concerns   Pt states her insurance doesn't cover the nicotine patch and she is requesting something else that the insurance will cover. Would you be able to send in something else for pt

## 2021-07-22 ENCOUNTER — Other Ambulatory Visit: Payer: Self-pay

## 2021-07-22 NOTE — Telephone Encounter (Signed)
Contacted pt and made aware of provider message pt states she has already reached out to someone from years ago and they will be sending out her the gum and they will be following up with pt

## 2021-07-22 NOTE — Telephone Encounter (Signed)
Renee Murray will not cover NRT (patches, gum, lozenges) as these are available OTC. Unfortunately, these products can be expensive OTC but she will have to purchase these if she wants to use them. If she interested in rx options that may be covered, bupropion or varenicline may be options but this approval would have to be made by Dr. Laural Benes.

## 2021-07-22 NOTE — Telephone Encounter (Signed)
Will forward to provider  

## 2021-07-29 ENCOUNTER — Other Ambulatory Visit: Payer: Self-pay | Admitting: Physician Assistant

## 2021-07-29 DIAGNOSIS — F418 Other specified anxiety disorders: Secondary | ICD-10-CM

## 2021-07-29 DIAGNOSIS — G5621 Lesion of ulnar nerve, right upper limb: Secondary | ICD-10-CM

## 2021-08-15 ENCOUNTER — Other Ambulatory Visit: Payer: Self-pay | Admitting: Physician Assistant

## 2021-08-15 DIAGNOSIS — I10 Essential (primary) hypertension: Secondary | ICD-10-CM

## 2021-09-06 ENCOUNTER — Other Ambulatory Visit: Payer: Self-pay | Admitting: Family Medicine

## 2021-09-06 DIAGNOSIS — Z09 Encounter for follow-up examination after completed treatment for conditions other than malignant neoplasm: Secondary | ICD-10-CM

## 2021-11-18 ENCOUNTER — Other Ambulatory Visit: Payer: Self-pay

## 2021-11-18 ENCOUNTER — Encounter: Payer: Self-pay | Admitting: Internal Medicine

## 2021-11-18 ENCOUNTER — Ambulatory Visit: Payer: Medicare Other | Attending: Internal Medicine | Admitting: Internal Medicine

## 2021-11-18 VITALS — BP 139/79 | HR 70 | Ht 59.0 in | Wt 148.2 lb

## 2021-11-18 DIAGNOSIS — R195 Other fecal abnormalities: Secondary | ICD-10-CM | POA: Diagnosis not present

## 2021-11-18 DIAGNOSIS — I10 Essential (primary) hypertension: Secondary | ICD-10-CM | POA: Diagnosis not present

## 2021-11-18 DIAGNOSIS — Z720 Tobacco use: Secondary | ICD-10-CM | POA: Diagnosis not present

## 2021-11-18 DIAGNOSIS — Z2821 Immunization not carried out because of patient refusal: Secondary | ICD-10-CM | POA: Diagnosis not present

## 2021-11-18 DIAGNOSIS — E1159 Type 2 diabetes mellitus with other circulatory complications: Secondary | ICD-10-CM | POA: Diagnosis not present

## 2021-11-18 LAB — POCT GLYCOSYLATED HEMOGLOBIN (HGB A1C): HbA1c, POC (prediabetic range): 5.9 % (ref 5.7–6.4)

## 2021-11-18 LAB — GLUCOSE, POCT (MANUAL RESULT ENTRY): POC Glucose: 95 mg/dl (ref 70–99)

## 2021-11-18 NOTE — Progress Notes (Signed)
Follow up. Hypertension.

## 2021-11-18 NOTE — Progress Notes (Signed)
Patient ID: Renee Murray, female    DOB: 1956-08-18  MRN: [id number]  CC: Hypertension   Subjective: Renee Murray is a 65 y.o. female who presents for chronic ds Her concerns today include:  DM, HTN, SAH (aneurysm) 2016, tob dep, chronic pain, mod/sev CTS RT (EMG)  DM:   Results for orders placed or performed in visit on 11/18/21  HgB A1c  Result Value Ref Range   Hemoglobin A1C     HbA1c POC (<> result, manual entry)     HbA1c, POC (prediabetic range) 5.9 5.7 - 6.4 %   HbA1c, POC (controlled diabetic range)    Glucose (CBG)  Result Value Ref Range   POC Glucose 95 70 - 99 mg/dl  Patient is diet controlled.  However she tells me that she over ate at Thanksgiving with the sweet potato pies and other pies.  She plans to do better for the Christmas holiday.  Not exercising as much since the season has changed.  She has not had an eye exam done as yet.  Reports transportation issues.  Insurance will change to [employer] come the 1st of next yr  HTN: Compliant with taking lisinopril but has not taken it as yet for the morning.  Denies any chest pains, shortness of breath, lower extremity swelling or chronic headaches.  She tries to limit salt in the foods.  Tob dep: Had problems getting nicotine patches from the pharmacy she called 1 800 quit now and got the nicotine lozenges.  She has cut down significantly and almost quit.  She tells me that she smokes 1 every now and again.   HM: Declines pneumonia and shingles vaccines..  Had Positive Cologuard.  Referred to GI and they have tried to reach her unsuccessfully.  Patient states that she currently has transportation issues and wants to hold off and schedule it after the first of the year.  She will arrange transportation at that time.  Also insurance will change to [facility].  Patient Active Problem List   Diagnosis Date Noted   Type 2 diabetes mellitus without complication, without long-term current use of insulin ([place])  07/19/2021   Cellulitis of left lower extremity 06/01/2021   Acute back pain with sciatica, right 05/31/2021   Dermatitis 05/31/2021   Right sciatic nerve pain 05/30/2021   Rash and nonspecific skin eruption 05/30/2021   Acute midline low back pain 12/27/2016   Acute pain of both shoulders 12/27/2016   Rotator cuff impingement syndrome 12/27/2016   Essential hypertension 01/25/2016   Chronic pain syndrome 01/25/2016   Prediabetes 10/13/2015   Anxiety about health 09/13/2015   Neuropathy 09/13/2015   Chronic generalized pain 09/13/2015   History of seasonal allergies 09/13/2015   SAH (subarachnoid hemorrhage) ([place])    Subarachnoid hemorrhage ([place]) 02/04/2015   History of sinus disease on CT 08/10/2014   Hypertension, goal below 150/90 03/17/2014   Health care maintenance 03/17/2014   Tobacco abuse 03/17/2014   Neuropathy, upper extremity bilaterally  08/06/2013     Current Outpatient Medications on File Prior to Visit  Medication Sig Dispense Refill   amLODipine (NORVASC) 10 MG tablet TAKE 1 TABLET DAILY TO LOWER BLOOD PRESSURE 90 tablet 1   Blood Glucose Monitoring Suppl (ONETOUCH VERIO) w/Device KIT Use to test blood sugar once daily. E11.9 1 kit 0   cetirizine (ZYRTEC) 10 MG tablet Take 1 tablet (10 mg total) by mouth daily. 90 tablet 1   citalopram (CELEXA) 10 MG tablet TAKE 1  TABLET DAILY 90 tablet 1   fluticasone (FLONASE) 50 MCG/ACT nasal spray Place 2 sprays into both nostrils daily as needed for allergies or rhinitis. 1 g 5   gabapentin (NEURONTIN) 300 MG capsule Take 1 capsule (300 mg total) by mouth 3 (three) times daily. 270 capsule 1   glucose blood (ONETOUCH VERIO) test strip Use to check blood sugar once daily. E11.9 100 each 6   ibuprofen (ADVIL) 200 MG tablet Take 200-800 mg by mouth every 6 (six) hours as needed (for pain or headaches).     Lancet Devices (ONETOUCH DELICA PLUS LANCING) MISC Use to check blood sugar once daily. E11.9 1 each 6   nicotine (NICODERM  CQ - DOSED IN MG/24 HR) 7 mg/24hr patch Place 1 patch (7 mg total) onto the skin daily. 28 patch 1   OneTouch Delica Lancets 29U MISC Test blood sugars twice daily. E11.9 100 each 11   trolamine salicylate (ASPERCREME) 10 % cream Apply 1 application topically as needed for muscle pain (or sciatica).      No current facility-administered medications on file prior to visit.    Allergies  Allergen Reactions   Demerol Other (See Comments)    "I almost died"   Morphine And Related Other (See Comments)    "Almost Died"    Social History   Socioeconomic History   Marital status: Divorced    Spouse name: Not on file   Number of children: 3   Years of education: Not on file   Highest education level: High school graduate  Occupational History   Occupation: retired  Tobacco Use   Smoking status: Some Days    Packs/day: 0.00    Years: 20.00    Pack years: 0.00    Types: Cigarettes   Smokeless tobacco: Never   Tobacco comments:    reports 1 pack per month  Vaping Use   Vaping Use: Never used  Substance and Sexual Activity   Alcohol use: Yes    Comment: occasionally   Drug use: No   Sexual activity: Not Currently  Other Topics Concern   Not on file  Social History Narrative   Lives alone   Right handed   Drinks 3-4 cups of caffeine daily   Social Determinants of Health   Financial Resource Strain: Not on file  Food Insecurity: Not on file  Transportation Needs: Not on file  Physical Activity: Not on file  Stress: Not on file  Social Connections: Not on file  Intimate Partner Violence: Not on file    Family History  Problem Relation Age of Onset   Diabetes Mother    Heart failure Mother    Cancer Father     Past Surgical History:  Procedure Laterality Date   ABDOMINAL HYSTERECTOMY     CHOLECYSTECTOMY     CRANIOTOMY Right 02/04/2015   Procedure: RIGHT CRANIOTOMY FOR ANEURYSM ;  Surgeon: Consuella Lose, MD;  Location: [facility];  Service: Neurosurgery;   Laterality: Right;   IR GENERIC HISTORICAL  02/04/2015   IR ANGIO INTRA EXTRACRAN SEL COM CAROTID INNOMINATE BILAT MOD SED 02/04/2015 Consuella Lose, MD [facility]   IR GENERIC HISTORICAL  02/04/2015   IR ANGIO VERTEBRAL SEL SUBCLAVIAN INNOMINATE UNI R MOD SED 02/04/2015 Consuella Lose, MD [facility]   IR GENERIC HISTORICAL  02/04/2015   IR ANGIO VERTEBRAL SEL VERTEBRAL UNI L MOD SED 02/04/2015 Consuella Lose, MD [facility]    ROS: Review of Systems Negative except as stated above  PHYSICAL EXAM: BP 139/79   Pulse 70   Ht _0  (1.499 m)   Wt 148 lb 3.2 oz (67.2 kg)   SpO2 97%   BMI 29.93 kg/m   Wt Readings from Last 3 Encounters:  11/18/21 148 lb 3.2 oz (67.2 kg)  07/19/21 143 lb 3.2 oz (65 kg)  05/31/21 145 lb (65.8 kg)    Physical Exam  General appearance - alert, well appearing, elderly African-American female and in no distress Mental status - normal mood, behavior, speech, dress, motor activity, and thought processes Neck - supple, no significant adenopathy Chest - clear to auscultation, no wheezes, rales or rhonchi, symmetric air entry Heart - normal rate, regular rhythm, normal S1, S2, no murmurs, rubs, clicks or gallops Extremities - peripheral pulses normal, no pedal edema, no clubbing or cyanosis   CMP Latest Ref Rng & Units 07/19/2021 12/06/2020 07/22/2020  Glucose 70 - 99 mg/dL - 81 125(H)  BUN 8 - 23 mg/dL - 10 10  Creatinine 0.44 - 1.00 mg/dL - 0.57 0.60  Sodium 135 - 145 mmol/L - 139 143  Potassium 3.5 - 5.1 mmol/L - 4.2 3.5  Chloride 98 - 111 mmol/L - 106 103  CO2 22 - 32 mmol/L - 21(L) -  Calcium 8.9 - 10.3 mg/dL - 8.8(L) -  Total Protein 6.0 - 8.5 g/dL 6.5 6.5 -  Total Bilirubin 0.0 - 1.2 mg/dL <0.2 0.4 -  Alkaline Phos 44 - 121 IU/L 85 76 -  AST 0 - 40 IU/L 17 28 -  ALT 0 - 32 IU/L 14 18 -   Lipid Panel     Component Value Date/Time   CHOL 155 07/19/2021 1418   TRIG 258 (H) 07/19/2021 1418   HDL 42 07/19/2021 1418   CHOLHDL 3.7  07/19/2021 1418   CHOLHDL 4.3 03/27/2017 1016   VLDL 26 03/27/2017 1016   LDLCALC 71 07/19/2021 1418    CBC    Component Value Date/Time   WBC 6.1 12/06/2020 1407   RBC 4.28 12/06/2020 1407   HGB 12.7 12/06/2020 1407   HCT 39.2 12/06/2020 1407   PLT 174 12/06/2020 1407   MCV 91.6 12/06/2020 1407   MCH 29.7 12/06/2020 1407   MCHC 32.4 12/06/2020 1407   RDW 14.2 12/06/2020 1407   LYMPHSABS 3.0 12/06/2020 1407   MONOABS 0.4 12/06/2020 1407   EOSABS 0.3 12/06/2020 1407   BASOSABS 0.0 12/06/2020 1407    ASSESSMENT AND PLAN:  1. Type 2 diabetes mellitus with other circulatory complications (HCC) At goal, diet control.  Dietary counseling given in regards to the holidays when people tend to overeat.  Encouraged her to do exercise activity inside her house like walking in place and stretching exercises.  Strongly encouraged her to get her eye exam done. - HgB A1c - Glucose (CBG) - Ambulatory referral to Ophthalmology  2. Essential hypertension Not at goal.  She has not taken lisinopril as yet for today.  She will take it when she returns home.  3. Tobacco abuse Commended her on significantly cutting back.  I now challenged her to set a quit date where she will quit completely.  4. Positive colorectal cancer screening using Cologuard test Discussed the importance of following through with getting the colonoscopy done given the positive Cologuard test.  She is agreeable for me to resubmit the referral to [facility] gastroenterology with the plan date after the first of the year. - Ambulatory referral to Gastroenterology  5. Pneumococcal vaccination declined Recommended.  Patient  declined.  She also declined Shingrix.    Patient was given the opportunity to ask questions.  Patient verbalized understanding of the plan and was able to repeat key elements of the plan.   Orders Placed This Encounter  Procedures   Ambulatory referral to Gastroenterology   Ambulatory referral to  Ophthalmology   HgB A1c   Glucose (CBG)     Requested Prescriptions    No prescriptions requested or ordered in this encounter    Return in about 4 months (around 03/19/2022).  Karle Plumber, MD, FACP

## 2021-11-25 ENCOUNTER — Telehealth: Payer: Self-pay | Admitting: Internal Medicine

## 2021-11-25 NOTE — Telephone Encounter (Signed)
Returned pt call. Pt had a question in regards to the DX code we used for DM. Made pt aware that it is the same DX code we used previously to check her A1C. Pt states she understands and doesn't have any questions or concerns

## 2021-11-25 NOTE — Telephone Encounter (Signed)
Copied from CRM [id number]. Topic: General - Call Back - No Documentation >> Nov 23, 2021  3:18 PM Randol Kern wrote: Reason for CRM: Pt called reporting that she has questions concerning her AVS and discrepancies that she has found. She is concerned that there are mistakes Best contact: [phone]

## 2022-01-06 ENCOUNTER — Ambulatory Visit (INDEPENDENT_AMBULATORY_CARE_PROVIDER_SITE_OTHER): Payer: Medicare (Managed Care) | Admitting: Orthopaedic Surgery

## 2022-01-06 ENCOUNTER — Other Ambulatory Visit: Payer: Self-pay

## 2022-01-06 ENCOUNTER — Encounter: Payer: Self-pay | Admitting: Orthopaedic Surgery

## 2022-01-06 DIAGNOSIS — G5601 Carpal tunnel syndrome, right upper limb: Secondary | ICD-10-CM | POA: Diagnosis not present

## 2022-01-06 MED ORDER — TRAMADOL HCL 50 MG PO TABS
50.0000 mg | ORAL_TABLET | Freq: Two times a day (BID) | ORAL | 2 refills | Status: DC | PRN
Start: 2022-01-06 — End: 2023-03-05

## 2022-01-06 NOTE — Progress Notes (Deleted)
Office Visit Note   Patient: Renee Murray           Date of Birth: 09/02/1956           MRN: [id number] Visit Date: 01/06/2022              Requested by: Marcine Matar, MD [address] [place],  [place] [address] PCP: Marcine Matar, MD   Assessment & Plan: Visit Diagnoses: No diagnosis found.  Plan: ***  Follow-Up Instructions: No follow-ups on file.   Orders:  No orders of the defined types were placed in this encounter.  No orders of the defined types were placed in this encounter.     Procedures: No procedures performed   Clinical Data: No additional findings.   Subjective: Chief Complaint  Patient presents with   Right Wrist - Pain    HPI  Review of Systems   Objective: Vital Signs: There were no vitals taken for this visit.  Physical Exam  Ortho Exam  Specialty Comments:  No specialty comments available.  Imaging: No results found.   PMFS History: Patient Active Problem List   Diagnosis Date Noted   Positive colorectal cancer screening using Cologuard test 11/18/2021   Type 2 diabetes mellitus without complication, without long-term current use of insulin (HCC) 07/19/2021   Cellulitis of left lower extremity 06/01/2021   Acute back pain with sciatica, right 05/31/2021   Dermatitis 05/31/2021   Right sciatic nerve pain 05/30/2021   Rash and nonspecific skin eruption 05/30/2021   Acute midline low back pain 12/27/2016   Acute pain of both shoulders 12/27/2016   Rotator cuff impingement syndrome 12/27/2016   Essential hypertension 01/25/2016   Chronic pain syndrome 01/25/2016   Prediabetes 10/13/2015   Anxiety about health 09/13/2015   Neuropathy 09/13/2015   Chronic generalized pain 09/13/2015   History of seasonal allergies 09/13/2015   SAH (subarachnoid hemorrhage) (HCC)    Subarachnoid hemorrhage (HCC) 02/04/2015   History of sinus disease on CT 08/10/2014   Hypertension, goal below 150/90  03/17/2014   Health care maintenance 03/17/2014   Tobacco abuse 03/17/2014   Neuropathy, upper extremity bilaterally  08/06/2013   Past Medical History:  Diagnosis Date   Brain aneurysm    Diabetes mellitus without complication (HCC)    Hypertension    Stroke (HCC)    stroke like symptons/admitted 8/21    Family History  Problem Relation Age of Onset   Diabetes Mother    Heart failure Mother    Cancer Father     Past Surgical History:  Procedure Laterality Date   ABDOMINAL HYSTERECTOMY     CHOLECYSTECTOMY     CRANIOTOMY Right 02/04/2015   Procedure: RIGHT CRANIOTOMY FOR ANEURYSM ;  Surgeon: Lisbeth Renshaw, MD;  Location: [facility];  Service: Neurosurgery;  Laterality: Right;   IR GENERIC HISTORICAL  02/04/2015   IR ANGIO INTRA EXTRACRAN SEL COM CAROTID INNOMINATE BILAT MOD SED 02/04/2015 Lisbeth Renshaw, MD [facility]   IR GENERIC HISTORICAL  02/04/2015   IR ANGIO VERTEBRAL SEL SUBCLAVIAN INNOMINATE UNI R MOD SED 02/04/2015 Lisbeth Renshaw, MD [facility]   IR GENERIC HISTORICAL  02/04/2015   IR ANGIO VERTEBRAL SEL VERTEBRAL UNI L MOD SED 02/04/2015 Lisbeth Renshaw, MD [facility]   Social History   Occupational History   Occupation: retired  Tobacco Use   Smoking status: Some Days    Packs/day: 0.00    Years: 20.00    Pack years: 0.00  Types: Cigarettes   Smokeless tobacco: Never   Tobacco comments:    reports 1 pack per month  Vaping Use   Vaping Use: Never used  Substance and Sexual Activity   Alcohol use: Yes    Comment: occasionally   Drug use: No   Sexual activity: Not Currently

## 2022-01-06 NOTE — Progress Notes (Signed)
Office Visit Note   Patient: Renee Murray           Date of Birth: 1956-03-06           MRN: [id number] Visit Date: 01/06/2022              Requested by: Marcine Matar, MD [address] [place],  [place] [address] PCP: Marcine Matar, MD   Assessment & Plan: Visit Diagnoses:  1. Carpal tunnel syndrome, right upper limb     Plan: Impression is right hand carpal tunnel syndrome moderate to severe nature.  Today, we discussed proceeding with repeat cortisone injection versus surgical intervention.  She would like to try 1 more injection at this point.  We have also provided her with a new Velcro splint.  We did go ahead and discuss the surgical course for which she is interested but would like to coordinate with family members prior to scheduling.  She will follow-up with [place] likely over the next few weeks to schedule the surgery.  Call with concerns or questions in the meantime.  Follow-Up Instructions: Return if symptoms worsen or fail to improve.   Orders:  Orders Placed This Encounter  Procedures   Hand/UE Inj: R carpal tunnel   Meds ordered this encounter  Medications   traMADol (ULTRAM) 50 MG tablet    Sig: Take 1 tablet (50 mg total) by mouth every 12 (twelve) hours as needed.    Dispense:  40 tablet    Refill:  2      Procedures: Hand/UE Inj: R carpal tunnel for carpal tunnel syndrome on 01/06/2022 11:02 AM Indications: pain Details: 25 G needle, volar approach Medications: 1 mL lidocaine 1 %; 1 mL bupivacaine 0.25 %; 1 mg methylPREDNISolone acetate 40 MG/ML     Clinical Data: No additional findings.   Subjective: Chief Complaint  Patient presents with   Right Wrist - Pain    HPI patient is a very pleasant 66 year old right-hand-dominant female who comes in today with recurrent paresthesias to the right hand.  She was seen in our office in October 2021 where she had been recently diagnosed with right carpal tunnel syndrome moderate to severe in  nature.  Cortisone injection was performed to the right carpal tunnel which significantly helped until about 2-1/2 weeks ago.  She is now complaining of near constant burning sensations to the entire hand and specifically into the index, long, ring and small fingers.  She is also having pain that radiates up to the volar forearm.  She feels as though her hand swells at times.  She denies any weakness.  She has been using ice, ibuprofen and IcyHot without significant relief.  She does note that she had a wrist splint but this has become worn out and she is no longer wearing it.  Review of Systems as detailed in HPI.  All others reviewed and are negative.   Objective: Vital Signs: There were no vitals taken for this visit.  Physical Exam well-developed well-nourished female no acute distress.  Alert and oriented x3.  Ortho Exam right hand exam shows positive Phalen and positive Tinel at the wrist.  No thenar atrophy.  Decreased grip strength.   Specialty Comments:  No specialty comments available.  Imaging: No new imaging   PMFS History: Patient Active Problem List   Diagnosis Date Noted   Positive colorectal cancer screening using Cologuard test 11/18/2021   Type 2 diabetes mellitus without complication, without long-term current use of  insulin (HCC) 07/19/2021   Cellulitis of left lower extremity 06/01/2021   Acute back pain with sciatica, right 05/31/2021   Dermatitis 05/31/2021   Right sciatic nerve pain 05/30/2021   Rash and nonspecific skin eruption 05/30/2021   Acute midline low back pain 12/27/2016   Acute pain of both shoulders 12/27/2016   Rotator cuff impingement syndrome 12/27/2016   Essential hypertension 01/25/2016   Chronic pain syndrome 01/25/2016   Prediabetes 10/13/2015   Anxiety about health 09/13/2015   Neuropathy 09/13/2015   Chronic generalized pain 09/13/2015   History of seasonal allergies 09/13/2015   SAH (subarachnoid hemorrhage) (HCC)    Subarachnoid  hemorrhage (HCC) 02/04/2015   History of sinus disease on CT 08/10/2014   Hypertension, goal below 150/90 03/17/2014   Health care maintenance 03/17/2014   Tobacco abuse 03/17/2014   Neuropathy, upper extremity bilaterally  08/06/2013   Past Medical History:  Diagnosis Date   Brain aneurysm    Diabetes mellitus without complication (HCC)    Hypertension    Stroke (HCC)    stroke like symptons/admitted 8/21    Family History  Problem Relation Age of Onset   Diabetes Mother    Heart failure Mother    Cancer Father     Past Surgical History:  Procedure Laterality Date   ABDOMINAL HYSTERECTOMY     CHOLECYSTECTOMY     CRANIOTOMY Right 02/04/2015   Procedure: RIGHT CRANIOTOMY FOR ANEURYSM ;  Surgeon: Lisbeth Renshaw, MD;  Location: [facility];  Service: Neurosurgery;  Laterality: Right;   IR GENERIC HISTORICAL  02/04/2015   IR ANGIO INTRA EXTRACRAN SEL COM CAROTID INNOMINATE BILAT MOD SED 02/04/2015 Lisbeth Renshaw, MD [facility]   IR GENERIC HISTORICAL  02/04/2015   IR ANGIO VERTEBRAL SEL SUBCLAVIAN INNOMINATE UNI R MOD SED 02/04/2015 Lisbeth Renshaw, MD [facility]   IR GENERIC HISTORICAL  02/04/2015   IR ANGIO VERTEBRAL SEL VERTEBRAL UNI L MOD SED 02/04/2015 Lisbeth Renshaw, MD [facility]   Social History   Occupational History   Occupation: retired  Tobacco Use   Smoking status: Some Days    Packs/day: 0.00    Years: 20.00    Pack years: 0.00    Types: Cigarettes   Smokeless tobacco: Never   Tobacco comments:    reports 1 pack per month  Vaping Use   Vaping Use: Never used  Substance and Sexual Activity   Alcohol use: Yes    Comment: occasionally   Drug use: No   Sexual activity: Not Currently

## 2022-01-07 MED ORDER — LIDOCAINE HCL 1 % IJ SOLN
1.0000 mL | INTRAMUSCULAR | Status: AC | PRN
  Administered 2022-01-06: 1 mL

## 2022-01-07 MED ORDER — BUPIVACAINE HCL 0.25 % IJ SOLN
1.0000 mL | INTRAMUSCULAR | Status: AC | PRN
  Administered 2022-01-06: 1 mL

## 2022-01-07 MED ORDER — METHYLPREDNISOLONE ACETATE 40 MG/ML IJ SUSP
1.0000 mg | INTRAMUSCULAR | Status: AC | PRN
  Administered 2022-01-06: 1 mg

## 2022-01-10 ENCOUNTER — Ambulatory Visit: Payer: Medicare Other | Admitting: Orthopaedic Surgery

## 2022-01-25 ENCOUNTER — Other Ambulatory Visit: Payer: Self-pay | Admitting: Internal Medicine

## 2022-01-25 DIAGNOSIS — G5621 Lesion of ulnar nerve, right upper limb: Secondary | ICD-10-CM

## 2022-01-25 DIAGNOSIS — F418 Other specified anxiety disorders: Secondary | ICD-10-CM
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
## 2022-02-09 ENCOUNTER — Other Ambulatory Visit: Payer: Self-pay | Admitting: Internal Medicine

## 2022-02-09 DIAGNOSIS — I10 Essential (primary) hypertension: Secondary | ICD-10-CM
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
## 2022-03-21 ENCOUNTER — Ambulatory Visit: Payer: Medicare Other | Admitting: Internal Medicine

## 2022-03-22 ENCOUNTER — Encounter: Payer: Self-pay | Admitting: Internal Medicine

## 2022-04-13 ENCOUNTER — Ambulatory Visit: Payer: Medicare Other | Admitting: Physician Assistant

## 2022-04-13 ENCOUNTER — Telehealth: Payer: Self-pay

## 2022-04-13 NOTE — Telephone Encounter (Signed)
provider is sick left a message on pts vmail to reschedule appt  ?

## 2022-04-25 ENCOUNTER — Other Ambulatory Visit: Payer: Self-pay | Admitting: Internal Medicine

## 2022-04-25 DIAGNOSIS — F418 Other specified anxiety disorders: Secondary | ICD-10-CM

## 2022-04-25 DIAGNOSIS — G5621 Lesion of ulnar nerve, right upper limb: Secondary | ICD-10-CM
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
## 2022-05-04 ENCOUNTER — Ambulatory Visit: Payer: Medicare Other | Admitting: Physician Assistant

## 2022-05-10 ENCOUNTER — Other Ambulatory Visit: Payer: Self-pay

## 2022-05-10 ENCOUNTER — Emergency Department (HOSPITAL_COMMUNITY)
Admission: EM | Admit: 2022-05-10 | Discharge: 2022-05-10 | Disposition: A | Discharge status: Home / Self Care | Payer: Medicare (Managed Care) | Attending: Emergency Medicine | Admitting: Emergency Medicine

## 2022-05-10 ENCOUNTER — Encounter (HOSPITAL_COMMUNITY): Payer: Self-pay

## 2022-05-10 DIAGNOSIS — G5601 Carpal tunnel syndrome, right upper limb: Secondary | ICD-10-CM | POA: Insufficient documentation

## 2022-05-10 DIAGNOSIS — M25531 Pain in right wrist: Secondary | ICD-10-CM | POA: Diagnosis present

## 2022-05-10 MED ORDER — TETRACAINE HCL 0.5 % OP SOLN
1.0000 [drp] | Freq: Once | OPHTHALMIC | Status: AC
  Administered 2022-05-10: 1 [drp] via OPHTHALMIC
  Filled 2022-05-10: qty 4

## 2022-05-10 MED ORDER — PREDNISONE 10 MG (21) PO TBPK: ORAL_TABLET | Freq: Every day | ORAL | 0 refills | Status: DC

## 2022-05-10 MED ORDER — FLUORESCEIN SODIUM 1 MG OP STRP
1.0000 | ORAL_STRIP | Freq: Once | OPHTHALMIC | Status: AC
  Administered 2022-05-10: 1 via OPHTHALMIC
  Filled 2022-05-10: qty 1

## 2022-05-10 NOTE — ED Triage Notes (Signed)
Pt arrived POV from home c/o a floater and burning in her left eye x2 days and right hand numbness x3 weeks. Pt states she had a cortisone shot in the past and it helped.

## 2022-05-10 NOTE — Discharge Instructions (Signed)
Regarding your wrist discomfort and suspected carpal tunnel syndrome, recommend trying the course of steroids.  Additionally strongly recommend following up with the orthopedic specialist that you have seen previously.  Please call their office to request a close follow-up appointment.  Regarding the floaters, please follow-up with an ophthalmologist to discuss the symptoms further.  If at any point you feel like you are losing vision, generalized blurriness, or develop any worsening numbness, weakness, speech change or other new concerning symptom, come back to ER for reassessment.

## 2022-05-10 NOTE — ED Provider Notes (Signed)
[facility] EMERGENCY DEPARTMENT Provider Note   CSN: [id number] Arrival date & time: 05/10/22  1324     History  Chief Complaint  Patient presents with   Eye Problem   Numbness    Renee Murray is a 66 y.o. female.  Presented to the emergency department due to concern for 2 different complaints.  Her chief complaint is her right wrist pain.  Patient states that Renee Murray has suffered from carpal tunnel syndrome and this feels similar to prior.  Deals with some pain and discomfort on daily basis, has pain that seems to shoot up her right arm at times also has some tingling sensation in some of her fingers.  States that previously this got significantly better with cortisone shot however during the last cortisone shot Renee Murray had some discomfort and was hesitant to go back to this orthopedic office.  Renee Murray denies any new trauma.  As a secondary complaint, patient also states that her left eye over the last few days Renee Murray has noticed a couple small floaters.  Renee Murray has not lost any vision in her eye, Renee Murray has not had any pain in her eye or known trauma to the eye.  Denies any other associated numbness, weakness, speech change.  No change to her right eye.  No headaches, neck pain.  HPI     Home Medications Prior to Admission medications   Medication Sig Start Date End Date Taking? Authorizing Provider  predniSONE (STERAPRED UNI-PAK 21 TAB) 10 MG (21) TBPK tablet Take by mouth daily. Take 6 tabs by mouth daily  for 1 day, then 5 tabs for 1 day, then 4 tabs for 1 day, then 3 tabs for 1 day, 2 tabs for 1 day, then 1 tab by mouth daily for 1 day 05/10/22  Yes Lucrezia Starch, MD  amLODipine (NORVASC) 10 MG tablet TAKE 1 TABLET DAILY TO LOWER BLOOD PRESSURE 02/09/22   Ladell Pier, MD  Blood Glucose Monitoring Suppl (ONETOUCH VERIO) w/Device KIT Use to test blood sugar once daily. E11.9 08/06/20   [place], Cammie, MD  cetirizine (ZYRTEC) 10 MG tablet Take 1 tablet (10 mg total) by mouth  daily. 01/06/21   Argentina Donovan, PA-C  citalopram (CELEXA) 10 MG tablet TAKE 1 TABLET DAILY 04/25/22   Ladell Pier, MD  fluticasone [facility]) 50 MCG/ACT nasal spray Place 2 sprays into both nostrils daily as needed for allergies or rhinitis. 01/06/21   Argentina Donovan, PA-C  gabapentin (NEURONTIN) 300 MG capsule Take 1 capsule (300 mg total) by mouth 3 (three) times daily. 01/06/21   Argentina Donovan, PA-C  glucose blood (ONETOUCH VERIO) test strip Use to check blood sugar once daily. E11.9 08/06/20   [place], Cammie, MD  ibuprofen (ADVIL) 200 MG tablet Take 200-800 mg by mouth every 6 (six) hours as needed (for pain or headaches).    [provider]  Lancet Devices [facility] DELICA PLUS LANCING) MISC Use to check blood sugar once daily. E11.9 08/06/20   Fulp, Cammie, MD  nicotine (NICODERM CQ - DOSED IN MG/24 HR) 7 mg/24hr patch Place 1 patch (7 mg total) onto the skin daily. 07/19/21   Ladell Pier, MD  OneTouch Delica Lancets 40N MISC Test blood sugars twice daily. E11.9 08/03/20   F[place], Cammie, MD  traMADol (ULTRAM) 50 MG tablet Take 1 tablet (50 mg total) by mouth every 12 (twelve) hours as needed. 01/06/22   Aundra Dubin, PA-C  trolamine salicylate (ASPERCREME) 10 %  cream Apply 1 application topically as needed for muscle pain (or sciatica).     [provider]      Allergies    Demerol and Morphine and related    Review of Systems   Review of Systems  Constitutional:  Negative for chills and fever.  HENT:  Negative for ear pain and sore throat.   Eyes:  Negative for pain and visual disturbance.  Respiratory:  Negative for cough and shortness of breath.   Cardiovascular:  Negative for chest pain and palpitations.  Gastrointestinal:  Negative for abdominal pain and vomiting.  Genitourinary:  Negative for dysuria and hematuria.  Musculoskeletal:  Positive for arthralgias. Negative for back pain.  Skin:  Negative for color change and rash.  Neurological:   Positive for numbness. Negative for seizures and syncope.  All other systems reviewed and are negative.  Physical Exam Updated Vital Signs BP 136/88   Pulse 64   Temp 98 F (36.7 C) (Oral)   Resp 18   Ht _0  (1.473 m)   Wt 67.1 kg   SpO2 98%   BMI 30.93 kg/m  Physical Exam Vitals and nursing note reviewed.  Constitutional:      General: Renee Murray is not in acute distress.    Appearance: Renee Murray is well-developed.  HENT:     Head: Normocephalic and atraumatic.  Eyes:     Conjunctiva/sclera: Conjunctivae normal.     Pupils: Pupils are equal, round, and reactive to light.     Comments: Both eyes appear grossly normal, EOM normal, visual fields intact in both eyes  Cardiovascular:     Rate and Rhythm: Normal rate and regular rhythm.     Heart sounds: No murmur heard. Pulmonary:     Effort: Pulmonary effort is normal. No respiratory distress.     Breath sounds: Normal breath sounds.  Abdominal:     Palpations: Abdomen is soft.     Tenderness: There is no abdominal tenderness.  Musculoskeletal:        General: No swelling.     Cervical back: Neck supple.  Skin:    General: Skin is warm and dry.     Capillary Refill: Capillary refill takes less than 2 seconds.  Neurological:     Mental Status: Renee Murray is alert.     Comments: AAOx3 CN 2-12 intact, speech clear visual fields intact 5/5 strength in b/l UE and LE Sensation to light touch intact in b/l UE and LE Normal FNF  Psychiatric:        Mood and Affect: Mood normal.    ED Results / Procedures / Treatments   Labs (all labs ordered are listed, but only abnormal results are displayed) Labs Reviewed - No data to display  EKG None  Radiology No results found.  Procedures Procedures    Medications Ordered in ED Medications  tetracaine (PONTOCAINE) 0.5 % ophthalmic solution 1 drop (1 drop Left Eye Given 05/10/22 0907)  fluorescein ophthalmic strip 1 strip (1 strip Left Eye Given 05/10/22 0907)    ED Course/ Medical  Decision Making/ A&P                           Medical Decision Making Risk Prescription drug management.   66 year old lady presenting to ER with 2 complaints.  Regarding her right hand tingling and right wrist pain.  Renee Murray has previously been diagnosed with carpal tunnel syndrome and reports symptoms today consistent with prior.  I suspect  this most likely is related to underlying carpal tunnel.  Renee Murray has no focal neurologic deficits to the right upper extremity.  No joint swelling, no overlying erythema.  Discussed different treatment options. Pt would like to try a course of oral steroids.  We will give Rx for steroid taper.  I strongly advised following up with her orthopedic specialist who has seen her previously for this issue to discuss additional treatment options.  I additionally had recommended using wrist brace but patient states Renee Murray already has this at home.  Regarding the eye floaters.  No loss of vision, eyes appear grossly normal, normal EOM, normal visual fields. Normal visual acuity. Completed fluorescein stain and no evidence noted for corneal abrasion. Ongoing for the past few days.  Given current exam, feel Renee Murray is stable for outpatient follow-up, recommend that Renee Murray follow-up with an ophthalmologist for further evaluation.  Provided information and instructed to call on-call ophthalmology to request appointment.  Reviewed return precautions regarding both the wrist and hand complaint as well as the eye complaint.  Discharged.        Final Clinical Impression(s) / ED Diagnoses Final diagnoses:  Carpal tunnel syndrome of right wrist    Rx / DC Orders ED Discharge Orders          Ordered    predniSONE (STERAPRED UNI-PAK 21 TAB) 10 MG (21) TBPK tablet  Daily        05/10/22 0845              Lucrezia Starch, MD 05/10/22 1044

## 2022-05-31 ENCOUNTER — Ambulatory Visit (INDEPENDENT_AMBULATORY_CARE_PROVIDER_SITE_OTHER): Payer: Medicare (Managed Care) | Admitting: Physician Assistant

## 2022-05-31 DIAGNOSIS — G5601 Carpal tunnel syndrome, right upper limb: Secondary | ICD-10-CM

## 2022-05-31 MED ORDER — DICLOFENAC SODIUM 75 MG PO TBEC: 75.0000 mg | DELAYED_RELEASE_TABLET | Freq: Two times a day (BID) | ORAL | 2 refills | Status: DC | PRN

## 2022-05-31 NOTE — Progress Notes (Signed)
Office Visit Note   Patient: Renee Murray           Date of Birth: Apr 09, 1956           MRN: [id number] Visit Date: 05/31/2022              Requested by: Ladell Pier, MD [place],  [place] [address] PCP: Ladell Pier, MD   Assessment & Plan: Visit Diagnoses:  1. Carpal tunnel syndrome, right upper limb     Plan: Impression is right carpal tunnel syndrome moderate to severe.  At this point, the patient is no longer interested in conservative treatment and would like to proceed with carpal tunnel releases soon as possible.  I do not want a refill of her steroid as this is increased her blood sugar.  She notes that she works third shift and is unable to take pain medicine as it puts her to sleep.  I have agreed to call in an anti-inflammatory.  We will obtain a hemoglobin A1c today for her underlying diabetes and we will have Debbie call her to schedule surgery in the near future.  Risk, benefits and possible complications reviewed.  Rehab recovery time discussed.  All questions were answered.  Follow-Up Instructions: Return if symptoms worsen or fail to improve.   Orders:  No orders of the defined types were placed in this encounter.  No orders of the defined types were placed in this encounter.     Procedures: No procedures performed   Clinical Data: No additional findings.   Subjective: Chief Complaint  Patient presents with   Right Wrist - Pain    HPI patient is a pleasant 66 year old female who comes in today with right wrist pain and paresthesias.  History of underlying moderate to severe carpal tunnel syndrome.  She has been seeing [place] for this where she has had multiple cortisone injections.  Her last injection was earlier this year which provided only a few months relief.  Her symptoms have returned and have progressively worsened.  She recently took a steroid pack which somewhat helped her pain but increased her blood sugar.   Night brace is no longer help.  She is interested in carpal tunnel surgery at this time.  Review of Systems as detailed in HPI.  All others reviewed and are negative.   Objective: Vital Signs: There were no vitals taken for this visit.  Physical Exam well-developed well-nourished female no acute distress.  Alert and oriented x3.  Ortho Exam right wrist exam shows positive Phalen and Tinel.  Decreased sensation throughout the median nerve distribution.  No thenar atrophy.  Specialty Comments:  No specialty comments available.  Imaging: No new imaging   PMFS History: Patient Active Problem List   Diagnosis Date Noted   Positive colorectal cancer screening using Cologuard test 11/18/2021   Type 2 diabetes mellitus without complication, without long-term current use of insulin ([place]) 07/19/2021   Cellulitis of left lower extremity 06/01/2021   Acute back pain with sciatica, right 05/31/2021   Dermatitis 05/31/2021   Right sciatic nerve pain 05/30/2021   Rash and nonspecific skin eruption 05/30/2021   Acute midline low back pain 12/27/2016   Acute pain of both shoulders 12/27/2016   Rotator cuff impingement syndrome 12/27/2016   Essential hypertension 01/25/2016   Chronic pain syndrome 01/25/2016   Prediabetes 10/13/2015   Anxiety about health 09/13/2015   Neuropathy 09/13/2015   Chronic generalized pain 09/13/2015   History  of seasonal allergies 09/13/2015   SAH (subarachnoid hemorrhage) ([place])    Subarachnoid hemorrhage ([place]) 02/04/2015   History of sinus disease on CT 08/10/2014   Hypertension, goal below 150/90 03/17/2014   Health care maintenance 03/17/2014   Tobacco abuse 03/17/2014   Neuropathy, upper extremity bilaterally  08/06/2013   Past Medical History:  Diagnosis Date   Brain aneurysm    Diabetes mellitus without complication ([place])    Hypertension    Stroke [facility])    stroke like symptons/admitted 8/21    Family History  Problem Relation Age of Onset    Diabetes Mother    Heart failure Mother    Cancer Father     Past Surgical History:  Procedure Laterality Date   ABDOMINAL HYSTERECTOMY     CHOLECYSTECTOMY     CRANIOTOMY Right 02/04/2015   Procedure: RIGHT CRANIOTOMY FOR ANEURYSM ;  Surgeon: Consuella Lose, MD;  Location: [facility];  Service: Neurosurgery;  Laterality: Right;   IR GENERIC HISTORICAL  02/04/2015   IR ANGIO INTRA EXTRACRAN SEL COM CAROTID INNOMINATE BILAT MOD SED 02/04/2015 Consuella Lose, MD [facility]   IR GENERIC HISTORICAL  02/04/2015   IR ANGIO VERTEBRAL SEL SUBCLAVIAN INNOMINATE UNI R MOD SED 02/04/2015 Consuella Lose, MD [facility]   IR GENERIC HISTORICAL  02/04/2015   IR ANGIO VERTEBRAL SEL VERTEBRAL UNI L MOD SED 02/04/2015 Consuella Lose, MD [facility]   Social History   Occupational History   Occupation: retired  Tobacco Use   Smoking status: Some Days    Packs/day: 0.00    Years: 20.00    Total pack years: 0.00    Types: Cigarettes   Smokeless tobacco: Never   Tobacco comments:    reports 1 pack per month  Vaping Use   Vaping Use: Never used  Substance and Sexual Activity   Alcohol use: Yes    Comment: occasionally   Drug use: No   Sexual activity: Not Currently

## 2022-06-01 LAB — HEMOGLOBIN A1C

## 2022-06-01 LAB — EXTRA SPECIMEN

## 2022-06-01 NOTE — Progress Notes (Signed)
I think she needs to come back to have redrawn.  Not sure what happened

## 2022-06-02 ENCOUNTER — Telehealth: Payer: Self-pay | Admitting: Physician Assistant

## 2022-06-02 NOTE — Telephone Encounter (Signed)
Called patient left voicemail message to return call to schedule a nurse visit for Labs

## 2022-06-09 ENCOUNTER — Telehealth: Payer: Self-pay | Admitting: Orthopaedic Surgery

## 2022-06-09 NOTE — Telephone Encounter (Signed)
Lvm for pt to cb. Needs to come back in for repeat A1C. Last one was incomplete.

## 2022-06-13 ENCOUNTER — Telehealth: Payer: Self-pay

## 2022-06-13 ENCOUNTER — Ambulatory Visit (INDEPENDENT_AMBULATORY_CARE_PROVIDER_SITE_OTHER): Payer: Medicare (Managed Care)

## 2022-06-13 DIAGNOSIS — G5601 Carpal tunnel syndrome, right upper limb: Secondary | ICD-10-CM

## 2022-06-14 ENCOUNTER — Telehealth: Payer: Self-pay | Admitting: Orthopaedic Surgery

## 2022-06-14 LAB — HEMOGLOBIN A1C
Hgb A1c MFr Bld: 5.5 % of total Hgb (ref <5.7)
Mean Plasma Glucose: 111 mg/dL
eAG (mmol/L): 6.2 mmol/L

## 2022-06-14 NOTE — Telephone Encounter (Signed)
Patient called needing a call back concerning the results of her labs. The number to contact patient is [phone]

## 2022-06-14 NOTE — Telephone Encounter (Signed)
Yes A1c is great.  We can proceed with surgery

## 2022-07-03 ENCOUNTER — Other Ambulatory Visit: Payer: Self-pay | Admitting: Physician Assistant

## 2022-07-03 MED ORDER — HYDROCODONE-ACETAMINOPHEN 5-325 MG PO TABS: 1.0000 | ORAL_TABLET | Freq: Three times a day (TID) | ORAL | 0 refills | Status: DC | PRN

## 2022-07-06 DIAGNOSIS — G5601 Carpal tunnel syndrome, right upper limb: Secondary | ICD-10-CM | POA: Diagnosis not present

## 2022-07-14 ENCOUNTER — Encounter: Payer: Self-pay | Admitting: Orthopaedic Surgery

## 2022-07-14 ENCOUNTER — Ambulatory Visit (INDEPENDENT_AMBULATORY_CARE_PROVIDER_SITE_OTHER): Payer: Medicare (Managed Care) | Admitting: Physician Assistant

## 2022-07-14 DIAGNOSIS — G5601 Carpal tunnel syndrome, right upper limb: Secondary | ICD-10-CM

## 2022-07-14 DIAGNOSIS — Z9889 Other specified postprocedural states: Secondary | ICD-10-CM

## 2022-07-14 NOTE — Progress Notes (Signed)
Post-Op Visit Note   Patient: Renee Murray           Date of Birth: [birth year]           MRN: [id number] Visit Date: 07/14/2022 PCP: Marcine Matar, MD   Assessment & Plan:  Chief Complaint:  Chief Complaint  Patient presents with   Right Wrist - Follow-up    Right carpal tunnel release 07/06/2022   Visit Diagnoses:  1. Carpal tunnel syndrome, right upper limb   2. S/P carpal tunnel release     Plan: Patient is a pleasant 66 year old female who comes in today 1 week status post right carpal tunnel release 07/06/2022.  She has been doing great.  She still notes some slight numbness to the tip of the thumb, index, long and ring fingers.  She denies any pain.  No fevers or chills.  Examination of right wrist reveals a well-healing surgical incision with nylon sutures in place.  No evidence of infection or cellulitis.  Fingers warm well perfused.  Today, the wound was cleaned and recovered.  She already has a Velcro splint at home so she will start wearing this today and for the next week.  She will follow-up in 1 week for suture removal.  No heavy lifting or submerging her hand underwater for 3 more weeks.  Call with concerns or questions in the meantime.  Follow-Up Instructions: Return in about 1 week (around 07/21/2022).   Orders:  No orders of the defined types were placed in this encounter.  No orders of the defined types were placed in this encounter.   Imaging: No new imaging  PMFS History: Patient Active Problem List   Diagnosis Date Noted   Positive colorectal cancer screening using Cologuard test 11/18/2021   Type 2 diabetes mellitus without complication, without long-term current use of insulin ([facility]) 07/19/2021   Cellulitis of left lower extremity 06/01/2021   Acute back pain with sciatica, right 05/31/2021   Dermatitis 05/31/2021   Right sciatic nerve pain 05/30/2021   Rash and nonspecific skin eruption 05/30/2021   Acute midline low back pain 12/27/2016    Acute pain of both shoulders 12/27/2016   Rotator cuff impingement syndrome 12/27/2016   Essential hypertension 01/25/2016   Chronic pain syndrome 01/25/2016   Prediabetes 10/13/2015   Anxiety about health 09/13/2015   Neuropathy 09/13/2015   Chronic generalized pain 09/13/2015   History of seasonal allergies 09/13/2015   SAH (subarachnoid hemorrhage) ([facility])    Subarachnoid hemorrhage ([facility]) 02/04/2015   History of sinus disease on CT 08/10/2014   Hypertension, goal below 150/90 03/17/2014   Health care maintenance 03/17/2014   Tobacco abuse 03/17/2014   Neuropathy, upper extremity bilaterally  08/06/2013   Past Medical History:  Diagnosis Date   Brain aneurysm    Diabetes mellitus without complication ([facility])    Hypertension    Stroke ([facility])    stroke like symptons/admitted 8/21    Family History  Problem Relation Age of Onset   Diabetes Mother    Heart failure Mother    Cancer Father     Past Surgical History:  Procedure Laterality Date   ABDOMINAL HYSTERECTOMY     CHOLECYSTECTOMY     CRANIOTOMY Right 02/04/2015   Procedure: RIGHT CRANIOTOMY FOR ANEURYSM ;  Surgeon: Lisbeth Renshaw, MD;  Location[facility];  Service: Neurosurgery;  Laterality: Right;   IR GENERIC HISTORICAL  02/04/2015   IR ANGIO INTRA EXTRACRAN SEL COM CAROTID INNOMINATE BILAT MOD SED 02/04/2015 Marlane Hatcher  Conchita Paris, MD [facility]   IR GENERIC HISTORICAL  02/04/2015   IR ANGIO VERTEBRAL SEL SUBCLAVIAN INNOMINATE UNI R MOD SED 02/04/2015 Lisbeth Renshaw, MD [facility]   IR GENERIC HISTORICAL  02/04/2015   IR ANGIO VERTEBRAL SEL VERTEBRAL UNI L MOD SED 02/04/2015 Lisbeth Renshaw, MD [facility]   Social History   Occupational History   Occupation: retired  Tobacco Use   Smoking status: Some Days    Packs/day: 0.00    Years: 20.00    Total pack years: 0.00    Types: Cigarettes   Smokeless tobacco: Never   Tobacco comments:    reports 1 pack per month  Vaping Use   Vaping Use: Never used   Substance and Sexual Activity   Alcohol use: Yes    Comment: occasionally   Drug use: No   Sexual activity: Not Currently

## 2022-07-19 ENCOUNTER — Encounter: Payer: Self-pay | Admitting: Orthopaedic Surgery

## 2022-07-19 ENCOUNTER — Ambulatory Visit (INDEPENDENT_AMBULATORY_CARE_PROVIDER_SITE_OTHER): Payer: Medicare (Managed Care) | Admitting: Physician Assistant

## 2022-07-19 DIAGNOSIS — Z9889 Other specified postprocedural states: Secondary | ICD-10-CM

## 2022-07-19 DIAGNOSIS — G5601 Carpal tunnel syndrome, right upper limb: Secondary | ICD-10-CM

## 2022-07-19 NOTE — Progress Notes (Signed)
Post-Op Visit Note   Patient: Renee Murray           Date of Birth: [birth year]           MRN: [id number] Visit Date: 07/19/2022 PCP: Marcine Matar, MD   Assessment & Plan:  Chief Complaint:  Chief Complaint  Patient presents with   Right Wrist - Follow-up    Right carpal tunnel release 07/06/2022   Visit Diagnoses:  1. Right carpal tunnel syndrome   2. S/P carpal tunnel release     Plan: Patient is a pleasant 66 year old female who comes in today 2 weeks status post right carpal tunnel release 07/06/2022.  She is doing well.  No complaints of pain.  She still notes some slight paresthesias to her fingertips of the index, long, ring and small fingers.  Overall doing well.  Examination of the right hand reveals a fully healed surgical scar with nylon sutures in place.  No evidence of infection or cellulitis.  Fingers are warm and well-perfused.  Today, sutures were removed and Steri-Strips applied.  No heavy lifting or submerging her hand underwater for another 2 weeks.  She will continue with range of motion exercises.  Follow-up in 4 weeks for recheck.  Call with concerns or questions.  Follow-Up Instructions: Return in about 4 weeks (around 08/16/2022).   Orders:  No orders of the defined types were placed in this encounter.  No orders of the defined types were placed in this encounter.   Imaging: No new imaging  PMFS History: Patient Active Problem List   Diagnosis Date Noted   Positive colorectal cancer screening using Cologuard test 11/18/2021   Type 2 diabetes mellitus without complication, without long-term current use of insulin ([facility]) 07/19/2021   Cellulitis of left lower extremity 06/01/2021   Acute back pain with sciatica, right 05/31/2021   Dermatitis 05/31/2021   Right sciatic nerve pain 05/30/2021   Rash and nonspecific skin eruption 05/30/2021   Acute midline low back pain 12/27/2016   Acute pain of both shoulders 12/27/2016   Rotator cuff  impingement syndrome 12/27/2016   Essential hypertension 01/25/2016   Chronic pain syndrome 01/25/2016   Prediabetes 10/13/2015   Anxiety about health 09/13/2015   Neuropathy 09/13/2015   Chronic generalized pain 09/13/2015   History of seasonal allergies 09/13/2015   SAH (subarachnoid hemorrhage) ([facility])    Subarachnoid hemorrhage ([facility]) 02/04/2015   History of sinus disease on CT 08/10/2014   Hypertension, goal below 150/90 03/17/2014   Health care maintenance 03/17/2014   Tobacco abuse 03/17/2014   Neuropathy, upper extremity bilaterally  08/06/2013   Past Medical History:  Diagnosis Date   Brain aneurysm    Diabetes mellitus without complication ([facility])    Hypertension    Stroke ([facility])    stroke like symptons/admitted 8/21    Family History  Problem Relation Age of Onset   Diabetes Mother    Heart failure Mother    Cancer Father     Past Surgical History:  Procedure Laterality Date   ABDOMINAL HYSTERECTOMY     CHOLECYSTECTOMY     CRANIOTOMY Right 02/04/2015   Procedure: RIGHT CRANIOTOMY FOR ANEURYSM ;  Surgeon: Lisbeth Renshaw, MD;  Location: [facility];  Service: Neurosurgery;  Laterality: Right;   IR GENERIC HISTORICAL  02/04/2015   IR ANGIO INTRA EXTRACRAN SEL COM CAROTID INNOMINATE BILAT MOD SED 02/04/2015 Lisbeth Renshaw, MD [facility]   IR GENERIC HISTORICAL  02/04/2015   IR ANGIO VERTEBRAL SEL SUBCLAVIAN INNOMINATE  UNI R MOD SED 02/04/2015 Lisbeth Renshaw, MD [facility]   IR GENERIC HISTORICAL  02/04/2015   IR ANGIO VERTEBRAL SEL VERTEBRAL UNI L MOD SED 02/04/2015 Lisbeth Renshaw, MD [facility]   Social History   Occupational History   Occupation: retired  Tobacco Use   Smoking status: Some Days    Packs/day: 0.00    Years: 20.00    Total pack years: 0.00    Types: Cigarettes   Smokeless tobacco: Never   Tobacco comments:    reports 1 pack per month  Vaping Use   Vaping Use: Never used  Substance and Sexual Activity   Alcohol use: Yes     Comment: occasionally   Drug use: No   Sexual activity: Not Currently

## 2022-07-24 ENCOUNTER — Other Ambulatory Visit: Payer: Self-pay | Admitting: Internal Medicine

## 2022-07-24 DIAGNOSIS — F418 Other specified anxiety disorders: Secondary | ICD-10-CM

## 2022-07-24 DIAGNOSIS — G5621 Lesion of ulnar nerve, right upper limb: Secondary | ICD-10-CM

## 2022-08-08 ENCOUNTER — Other Ambulatory Visit: Payer: Self-pay | Admitting: Internal Medicine

## 2022-08-08 DIAGNOSIS — I10 Essential (primary) hypertension: Secondary | ICD-10-CM
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
## 2022-08-16 ENCOUNTER — Encounter: Payer: Medicare (Managed Care) | Admitting: Orthopaedic Surgery

## 2022-08-22 ENCOUNTER — Other Ambulatory Visit: Payer: Self-pay | Admitting: Internal Medicine

## 2022-08-22 DIAGNOSIS — I10 Essential (primary) hypertension: Secondary | ICD-10-CM

## 2022-08-23 ENCOUNTER — Telehealth: Payer: Self-pay | Admitting: Orthopaedic Surgery

## 2022-08-23 NOTE — Telephone Encounter (Signed)
Numbness can last 6-12 months sometimes depending on how severe the carpal tunnel syndrome is.

## 2022-08-23 NOTE — Telephone Encounter (Signed)
Pt called and states that she feels numbness in the tip of her fingers, so she is wondering how long that is suppose to last after surgery?   CB [phone]

## 2022-08-23 NOTE — Telephone Encounter (Signed)
Called and [facility] for patient with information.

## 2022-11-22 ENCOUNTER — Ambulatory Visit (HOSPITAL_COMMUNITY)
Admission: EM | Admit: 2022-11-22 | Discharge: 2022-11-22 | Disposition: A | Discharge status: Home / Self Care | Payer: Medicare (Managed Care) | Attending: Physician Assistant | Admitting: Physician Assistant

## 2022-11-22 ENCOUNTER — Ambulatory Visit (INDEPENDENT_AMBULATORY_CARE_PROVIDER_SITE_OTHER): Payer: Medicare (Managed Care)

## 2022-11-22 ENCOUNTER — Encounter (HOSPITAL_COMMUNITY): Payer: Self-pay

## 2022-11-22 DIAGNOSIS — R0989 Other specified symptoms and signs involving the circulatory and respiratory systems: Secondary | ICD-10-CM | POA: Diagnosis not present

## 2022-11-22 DIAGNOSIS — R059 Cough, unspecified: Secondary | ICD-10-CM | POA: Diagnosis not present

## 2022-11-22 DIAGNOSIS — J22 Unspecified acute lower respiratory infection: Secondary | ICD-10-CM

## 2022-11-22 DIAGNOSIS — B9789 Other viral agents as the cause of diseases classified elsewhere: Secondary | ICD-10-CM | POA: Diagnosis not present

## 2022-11-22 DIAGNOSIS — U071 COVID-19: Secondary | ICD-10-CM

## 2022-11-22 MED ORDER — ALBUTEROL SULFATE HFA 108 (90 BASE) MCG/ACT IN AERS: 1.0000 | INHALATION_SPRAY | Freq: Four times a day (QID) | RESPIRATORY_TRACT | 0 refills | Status: AC | PRN

## 2022-11-22 MED ORDER — IPRATROPIUM-ALBUTEROL 0.5-2.5 (3) MG/3ML IN SOLN
RESPIRATORY_TRACT | Status: AC
  Filled 2022-11-22: qty 3

## 2022-11-22 MED ORDER — BENZONATATE 100 MG PO CAPS
100.0000 mg | ORAL_CAPSULE | Freq: Three times a day (TID) | ORAL | 0 refills | Status: DC
Start: 2022-11-22 — End: 2023-03-05

## 2022-11-22 MED ORDER — FLUTICASONE PROPIONATE 50 MCG/ACT NA SUSP: 1.0000 | Freq: Every day | NASAL | 0 refills | Status: AC

## 2022-11-22 MED ORDER — IPRATROPIUM-ALBUTEROL 0.5-2.5 (3) MG/3ML IN SOLN
3.0000 mL | Freq: Once | RESPIRATORY_TRACT | Status: AC
  Administered 2022-11-22: 3 mL via RESPIRATORY_TRACT

## 2022-11-22 NOTE — ED Triage Notes (Signed)
Pt states cough and congestion for the  past 5 days.  Has been taking OTC  cough medicine at  home. States she took 3 home covid tests and one was positive and 2 were negative

## 2022-11-22 NOTE — Discharge Instructions (Signed)
Your x-ray showed evidence of a viral infection but no pneumonia.  This is consistent with your recent positive COVID test at home.  I do think you are in the process of recovering from COVID.  Please rest and drink plenty of fluid.  Use albuterol every 4-6 hours as needed.  Take Tessalon for cough.  Use Flonase and your cetirizine for congestion.  Make sure you rest and drink plenty of fluid.  Follow-up with your primary care within a few days.  If you have any changing or worsening symptoms you need to be seen immediately including worsening cough, shortness of breath, nausea/vomiting interfere with oral intake.

## 2022-11-22 NOTE — ED Provider Notes (Signed)
[place]    CSN: [id number] Arrival date & time: 11/22/22  4268      History   Chief Complaint Chief Complaint  Patient presents with   Cough    HPI Renee Murray is a 66 y.o. female.   Patient presents today with a 5 to 6-day history of URI symptoms including nasal congestion, rhinorrhea, cough.  Denies any fever, chest pain, shortness of breath, nausea, vomiting, diarrhea.  She did take an at-home COVID test that was initially positive.  She then repeated this several days later and both times that she repeated it was negative.  She has not had COVID in the past other than potentially current illness.  She has had COVID vaccines.  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  Denies any recent antibiotics or steroids.  She has tried over-the-counter medication including Mucinex and Tylenol without improvement of symptoms.  She does have a history of allergies and takes cetirizine daily.  She denies any history of asthma or COPD.  She is a current someday smoker and has been smoking most of her life; denies formal diagnosis of COPD.    Past Medical History:  Diagnosis Date   Brain aneurysm    Diabetes mellitus without complication ([place])    Hypertension    Stroke ([place])    stroke like symptons/admitted 8/21    Patient Active Problem List   Diagnosis Date Noted   Positive colorectal cancer screening using Cologuard test 11/18/2021   Type 2 diabetes mellitus without complication, without long-term current use of insulin ([place]) 07/19/2021   Cellulitis of left lower extremity 06/01/2021   Acute back pain with sciatica, right 05/31/2021   Dermatitis 05/31/2021   Right sciatic nerve pain 05/30/2021   Rash and nonspecific skin eruption 05/30/2021   Acute midline low back pain 12/27/2016   Acute pain of both shoulders 12/27/2016   Rotator cuff impingement syndrome 12/27/2016   Essential hypertension  01/25/2016   Chronic pain syndrome 01/25/2016   Prediabetes 10/13/2015   Anxiety about health 09/13/2015   Neuropathy 09/13/2015   Chronic generalized pain 09/13/2015   History of seasonal allergies 09/13/2015   SAH (subarachnoid hemorrhage) ([place])    Subarachnoid hemorrhage ([place]) 02/04/2015   History of sinus disease on CT 08/10/2014   Hypertension, goal below 150/90 03/17/2014   Health care maintenance 03/17/2014   Tobacco abuse 03/17/2014   Neuropathy, upper extremity bilaterally  08/06/2013    Past Surgical History:  Procedure Laterality Date   ABDOMINAL HYSTERECTOMY     CHOLECYSTECTOMY     CRANIOTOMY Right 02/04/2015   Procedure: RIGHT CRANIOTOMY FOR ANEURYSM ;  Surgeon: Consuella Lose, MD;  Location: [facility];  Service: Neurosurgery;  Laterality: Right;   IR GENERIC HISTORICAL  02/04/2015   IR ANGIO INTRA EXTRACRAN SEL COM CAROTID INNOMINATE BILAT MOD SED 02/04/2015 Consuella Lose, MD [facility]   IR GENERIC HISTORICAL  02/04/2015   IR ANGIO VERTEBRAL SEL SUBCLAVIAN INNOMINATE UNI R MOD SED 02/04/2015 Consuella Lose, MD [facility]   IR GENERIC HISTORICAL  02/04/2015   IR ANGIO VERTEBRAL SEL VERTEBRAL UNI L MOD SED 02/04/2015 Consuella Lose, MD [facility]    OB History   No obstetric history on file.      Home Medications    Prior to Admission medications   Medication Sig Start Date End Date Taking? Authorizing Provider  albuterol (VENTOLIN HFA) 108 (90 Base) MCG/ACT  inhaler Inhale 1-2 puffs into the lungs every 6 (six) hours as needed for wheezing or shortness of breath. 11/22/22  Yes Asmara Backs K, PA-C  amLODipine (NORVASC) 10 MG tablet TAKE 1 TABLET DAILY TO LOWER BLOOD PRESSURE 08/08/22   Ladell Pier, MD  benzonatate (TESSALON) 100 MG capsule Take 1 capsule (100 mg total) by mouth every 8 (eight) hours. 11/22/22  Yes Viana Sleep K, PA-C  fluticasone (FLONASE) 50 MCG/ACT nasal spray Place 1 spray into both nostrils daily. 11/22/22  Yes Ameilia Rattan,  Gemma Ruan K, PA-C  Blood Glucose Monitoring Suppl (ONETOUCH VERIO) w/Device KIT Use to test blood sugar once daily. E11.9 08/06/20   Fulp, Cammie, MD  cetirizine (ZYRTEC) 10 MG tablet Take 1 tablet (10 mg total) by mouth daily. 01/06/21   Argentina Donovan, PA-C  citalopram (CELEXA) 10 MG tablet TAKE 1 TABLET DAILY 04/25/22   Ladell Pier, MD  diclofenac (VOLTAREN) 75 MG EC tablet Take 1 tablet (75 mg total) by mouth 2 (two) times daily as needed. 05/31/22   Aundra Dubin, PA-C  fluticasone (FLONASE) 50 MCG/ACT nasal spray Place 2 sprays into both nostrils daily as needed for allergies or rhinitis. 01/06/21   Argentina Donovan, PA-C  gabapentin (NEURONTIN) 300 MG capsule Take 1 capsule (300 mg total) by mouth 3 (three) times daily. 01/06/21   Argentina Donovan, PA-C  glucose blood (ONETOUCH VERIO) test strip Use to check blood sugar once daily. E11.9 08/06/20   Fulp, Cammie, MD  HYDROcodone-acetaminophen (NORCO) 5-325 MG tablet Take 1 tablet by mouth 3 (three) times daily as needed. To be taken after surgery 07/03/22   Aundra Dubin, PA-C  ibuprofen (ADVIL) 200 MG tablet Take 200-800 mg by mouth every 6 (six) hours as needed (for pain or headaches).    [provider]  Lancet Devices [facility] DELICA PLUS LANCING) MISC Use to check blood sugar once daily. E11.9 08/06/20   Fulp, Cammie, MD  nicotine (NICODERM CQ - DOSED IN MG/24 HR) 7 mg/24hr patch Place 1 patch (7 mg total) onto the skin daily. 07/19/21   Ladell Pier, MD  OneTouch Delica Lancets 97W MISC Test blood sugars twice daily. E11.9 08/03/20   Fulp, Cammie, MD  predniSONE (STERAPRED UNI-PAK 21 TAB) 10 MG (21) TBPK tablet Take by mouth daily. Take 6 tabs by mouth daily  for 1 day, then 5 tabs for 1 day, then 4 tabs for 1 day, then 3 tabs for 1 day, 2 tabs for 1 day, then 1 tab by mouth daily for 1 day 05/10/22   Lucrezia Starch, MD  traMADol (ULTRAM) 50 MG tablet Take 1 tablet (50 mg total) by mouth every 12 (twelve) hours as needed.  01/06/22   Aundra Dubin, PA-C  trolamine salicylate (ASPERCREME) 10 % cream Apply 1 application topically as needed for muscle pain (or sciatica).     [provider]    Family History Family History  Problem Relation Age of Onset   Diabetes Mother    Heart failure Mother    Cancer Father     Social History Social History   Tobacco Use   Smoking status: Some Days    Packs/day: 0.00    Years: 20.00    Total pack years: 0.00    Types: Cigarettes   Smokeless tobacco: Never   Tobacco comments:    reports 1 pack per month  Vaping Use   Vaping Use: Never used  Substance Use Topics   Alcohol  use: Yes    Comment: occasionally   Drug use: No     Allergies   Demerol and Morphine and related   Review of Systems Review of Systems  Constitutional:  Positive for activity change. Negative for appetite change, fatigue and fever.  HENT:  Positive for congestion and sinus pressure. Negative for sneezing and sore throat.   Respiratory:  Positive for cough and wheezing. Negative for shortness of breath.   Cardiovascular:  Negative for chest pain.  Gastrointestinal:  Negative for abdominal pain, diarrhea, nausea and vomiting.  Neurological:  Negative for dizziness, light-headedness and headaches.     Physical Exam Triage Vital Signs ED Triage Vitals [11/22/22 1015]  Enc Vitals Group     BP (!) 152/65     Pulse Rate 75     Resp 16     Temp 98 F (36.7 C)     Temp Source Oral     SpO2 98 %     Weight      Height      Head Circumference      Peak Flow      Pain Score 0     Pain Loc      Pain Edu?      Excl. in [place]?    No data found.  Updated Vital Signs BP (!) 152/65 (BP Location: Left Arm)   Pulse 75   Temp 98 F (36.7 C) (Oral)   Resp 16   SpO2 98%   Visual Acuity Right Eye Distance:   Left Eye Distance:   Bilateral Distance:    Right Eye Near:   Left Eye Near:    Bilateral Near:     Physical Exam Vitals reviewed.  Constitutional:       General: She is awake. She is not in acute distress.    Appearance: Normal appearance. She is well-developed. She is not ill-appearing.     Comments: Very pleasant female appears stated age in no acute distress sitting comfortably in exam room  HENT:     Head: Normocephalic and atraumatic.     Right Ear: Tympanic membrane, ear canal and external ear normal. Tympanic membrane is not erythematous or bulging.     Left Ear: Tympanic membrane, ear canal and external ear normal. Tympanic membrane is not erythematous or bulging.     Nose:     Right Sinus: No maxillary sinus tenderness or frontal sinus tenderness.     Left Sinus: No maxillary sinus tenderness or frontal sinus tenderness.     Mouth/Throat:     Dentition: Has dentures.     Pharynx: Uvula midline. Posterior oropharyngeal erythema present. No oropharyngeal exudate.     Comments: Erythema and drainage in posterior oropharynx Cardiovascular:     Rate and Rhythm: Normal rate and regular rhythm.     Heart sounds: Normal heart sounds, S1 normal and S2 normal. No murmur heard. Pulmonary:     Effort: Pulmonary effort is normal.     Breath sounds: Normal breath sounds. No wheezing, rhonchi or rales.     Comments: Widespread wheezing Psychiatric:        Behavior: Behavior is cooperative.      UC Treatments / Results  Labs (all labs ordered are listed, but only abnormal results are displayed) Labs Reviewed - No data to display  EKG   Radiology DG Chest 2 View  Result Date: 11/22/2022 CLINICAL DATA:  66 year old female with increasing cough and congestion for 5 days. EXAM: CHEST - 2  VIEW COMPARISON:  Chest radiographs 12/06/2020 and earlier. FINDINGS: PA and lateral views at 1046 hours. Lower lung volumes, some exaggeration of the mediastinal contours, but there seems to be mild cardiomegaly now, mildly increased tortuosity of the thoracic aorta. Visualized tracheal air column is within normal limits. Diffuse increased pulmonary  interstitial opacity bilaterally is new. No pleural effusion. No pneumothorax or confluent opacity. No acute osseous abnormality identified. Stable cholecystectomy clips. Negative visible bowel gas. IMPRESSION: New cardiomegaly and generalized pulmonary interstitial opacity since 2021, suspicious for acute interstitial edema. Alternatively consider viral/atypical respiratory infection. No pleural effusion. Electronically Signed   By: Genevie Ann M.D.   On: 11/22/2022 10:49    Procedures Procedures (including critical care time)  Medications Ordered in UC Medications  ipratropium-albuterol (DUONEB) 0.5-2.5 (3) MG/3ML nebulizer solution 3 mL (3 mLs Nebulization Given 11/22/22 1033)    Initial Impression / Assessment and Plan / UC Course  I have reviewed the triage vital signs and the nursing notes.  Pertinent labs & imaging results that were available during my care of the patient were reviewed by me and considered in my medical decision making (see chart for details).     Patient is well-appearing, afebrile, nontoxic, nontachycardic, with oxygen saturation of 98%.  Chest x-ray was obtained that showed concern for acute interstitial edema versus viral/atypical respiratory infection.  Suspect symptoms are related to COVID-19 infection as patient had a positive at-home COVID test.  She is outside the window of effectiveness for antiviral therapy.  She was given DuoNeb in clinic with improvement of symptoms.  She was sent home with an albuterol inhaler with instruction to use this every 4-6 hours as needed for shortness of breath and coughing fits.  She was given Tessalon to manage cough and encouraged to use antihistamine and Flonase for congestion symptoms.  Recommended she follow-up with her primary care within the next 48 hours for reevaluation.  Encouraged her to obtain a pulse oximeter and monitor oxygen saturation and if this drops below 93% she needs to be reevaluated and if below 90% she needs to go  to the emergency room.  Discussed that if she has any changing or worsening symptoms she needs to be seen immediately including shortness of breath, worsening cough, fever, weakness, nausea/vomiting interfere with oral intake.  Strict return precautions given to which she expressed understanding.  Work excuse note provided.  Final Clinical Impressions(s) / UC Diagnoses   Final diagnoses:  Viral infection of lower respiratory system  COVID-19     Discharge Instructions      Your x-ray showed evidence of a viral infection but no pneumonia.  This is consistent with your recent positive COVID test at home.  I do think you are in the process of recovering from [place].  Please rest and drink plenty of fluid.  Use albuterol every 4-6 hours as needed.  Take Tessalon for cough.  Use Flonase and your cetirizine for congestion.  Make sure you rest and drink plenty of fluid.  Follow-up with your primary care within a few days.  If you have any changing or worsening symptoms you need to be seen immediately including worsening cough, shortness of breath, nausea/vomiting interfere with oral intake.     ED Prescriptions     Medication Sig Dispense Auth. Provider   albuterol (VENTOLIN HFA) 108 (90 Base) MCG/ACT inhaler Inhale 1-2 puffs into the lungs every 6 (six) hours as needed for wheezing or shortness of breath. 18 g Antavius Sperbeck K, PA-C  benzonatate (TESSALON) 100 MG capsule Take 1 capsule (100 mg total) by mouth every 8 (eight) hours. 21 capsule Maycee Blasco K, PA-C   fluticasone (FLONASE) 50 MCG/ACT nasal spray Place 1 spray into both nostrils daily. 16 g Yarden Manuelito K, PA-C      PDMP not reviewed this encounter.   Terrilee Croak, PA-C 11/22/22 1125

## 2023-01-16 ENCOUNTER — Telehealth: Payer: Self-pay | Admitting: Emergency Medicine

## 2023-01-16 ENCOUNTER — Telehealth: Payer: Self-pay | Admitting: Internal Medicine

## 2023-01-16 NOTE — Telephone Encounter (Signed)
Copied from [place] [id number]. Topic: Appointment Scheduling - Scheduling Inquiry for Clinic >> Jan 16, 2023  1:05 PM [place] Sous wrote: Reason for CRM: Pt called requesting to schedule a MWV for in office and not via telephone, please advise and follow up with patient.

## 2023-01-16 NOTE — Telephone Encounter (Signed)
Spoke to patient to schedule Medicare Annual Wellness Visit (AWV) either virtually phone   my Renee Murray number [phone]   awvi 07/18/18 per [place]    Patient wanted in office appt.  I transferred to [phone] to schedule

## 2023-01-17 NOTE — Telephone Encounter (Signed)
Called & LVM to call back. Please connect patient to Jacqulyn Bath if patient calls back.

## 2023-01-18 NOTE — Telephone Encounter (Signed)
Called & spoke to the patient. Patient refused to verify identity and insisted on calling back to set up an appointment on her own.

## 2023-03-05 ENCOUNTER — Encounter: Payer: Self-pay | Admitting: Internal Medicine

## 2023-03-05 ENCOUNTER — Ambulatory Visit: Payer: Medicare (Managed Care) | Attending: Physician Assistant | Admitting: Internal Medicine

## 2023-03-05 VITALS — BP 153/77 | HR 80 | Temp 98.0°F | Ht <= 58 in | Wt 154.0 lb

## 2023-03-05 DIAGNOSIS — I152 Hypertension secondary to endocrine disorders: Secondary | ICD-10-CM | POA: Diagnosis not present

## 2023-03-05 DIAGNOSIS — Z23 Encounter for immunization: Secondary | ICD-10-CM | POA: Diagnosis not present

## 2023-03-05 DIAGNOSIS — E1159 Type 2 diabetes mellitus with other circulatory complications: Secondary | ICD-10-CM | POA: Diagnosis not present

## 2023-03-05 DIAGNOSIS — F419 Anxiety disorder, unspecified: Secondary | ICD-10-CM

## 2023-03-05 DIAGNOSIS — N3000 Acute cystitis without hematuria: Secondary | ICD-10-CM | POA: Diagnosis not present

## 2023-03-05 DIAGNOSIS — Z720 Tobacco use: Secondary | ICD-10-CM

## 2023-03-05 DIAGNOSIS — R195 Other fecal abnormalities: Secondary | ICD-10-CM

## 2023-03-05 DIAGNOSIS — F411 Generalized anxiety disorder: Secondary | ICD-10-CM | POA: Diagnosis not present

## 2023-03-05 DIAGNOSIS — F17210 Nicotine dependence, cigarettes, uncomplicated: Secondary | ICD-10-CM | POA: Diagnosis not present

## 2023-03-05 DIAGNOSIS — Z1231 Encounter for screening mammogram for malignant neoplasm of breast: Secondary | ICD-10-CM

## 2023-03-05 LAB — GLUCOSE, POCT (MANUAL RESULT ENTRY): POC Glucose: 127 mg/dl — AB (ref 70–99)

## 2023-03-05 LAB — POCT GLYCOSYLATED HEMOGLOBIN (HGB A1C): HbA1c, POC (controlled diabetic range): 5.9 % (ref 0.0–7.0)

## 2023-03-05 LAB — POCT URINALYSIS DIP (CLINITEK)
Bilirubin, UA: NEGATIVE
Glucose, UA: NEGATIVE mg/dL
Ketones, POC UA: NEGATIVE mg/dL
Nitrite, UA: NEGATIVE
POC PROTEIN,UA: NEGATIVE
Spec Grav, UA: 1.01 (ref 1.010–1.025)
Urobilinogen, UA: 0.2 E.U./dL
pH, UA: 7 (ref 5.0–8.0)

## 2023-03-05 MED ORDER — CITALOPRAM HYDROBROMIDE 10 MG PO TABS: 10.0000 mg | ORAL_TABLET | Freq: Every day | ORAL | 1 refills | Status: DC

## 2023-03-05 MED ORDER — AMLODIPINE BESYLATE 10 MG PO TABS: ORAL_TABLET | ORAL | 1 refills | Status: DC

## 2023-03-05 MED ORDER — SULFAMETHOXAZOLE-TRIMETHOPRIM 800-160 MG PO TABS: 1.0000 | ORAL_TABLET | Freq: Two times a day (BID) | ORAL | 0 refills | Status: DC

## 2023-03-05 NOTE — Progress Notes (Signed)
Patient ID: Renee Murray, female    DOB: 1956-08-05  MRN: [id number]  CC: Diabetes (DM f/u. Jay Schlichter urine, odor /Requesting blood work - potassium)   Subjective: Renee Murray is a 67 y.o. female who presents for chronic ds management.  Last seen 11/2021 Her concerns today include:  DM, HTN, SAH (aneurysm) 2016, tob dep, chronic pain, mod/sev CTS RT s/p release   C/o cloudy urine with odor that started 5 days ago. Drank a lot of water and cranberry juice.  The odor has cleared Sometimes she holds urine longer than she should when working  DM: Results for orders placed or performed in visit on 03/05/23  POCT glucose (manual entry)  Result Value Ref Range   POC Glucose 127 (A) 70 - 99 mg/dl  POCT glycosylated hemoglobin (Hb A1C)  Result Value Ref Range   Hemoglobin A1C     HbA1c POC (<> result, manual entry)     HbA1c, POC (prediabetic range)     HbA1c, POC (controlled diabetic range) 5.9 0.0 - 7.0 %  POCT URINALYSIS DIP (CLINITEK)  Result Value Ref Range   Color, UA yellow yellow   Clarity, UA clear clear   Glucose, UA negative negative mg/dL   Bilirubin, UA negative negative   Ketones, POC UA negative negative mg/dL   Spec Grav, UA 1.010 1.010 - 1.025   Blood, UA trace-intact (A) negative   pH, UA 7.0 5.0 - 8.0   POC PROTEIN,UA negative negative, trace   Urobilinogen, UA 0.2 0.2 or 1.0 E.U./dL   Nitrite, UA Negative Negative   Leukocytes, UA Small (1+) (A) Negative  Diet control on last visit. Checks BS 2x/wk. Gives range 90-105 Eating habits: not as well as she should since 08/2022.  Was busy taking care of her children's father who subsequently died. Started eating BF biscuit and eating out more.  Drinks coffee, [employer]. Snack on fruits and veggies Started to get out more that spring is here; loves to work outdoor in her garden. Does some walking at work.  HTN: on Norvasc 10 mg daily.  Forgot to take today. Try to limit salt.  Does not check BP  Tob dep: still  smoking.  1 pk last over a wk.  Had tried nicotine lozengers in past and found it helpful.  Still has some and plans to use them  Hx of + cologuard.  Referred to GI in the past but has issues with transportation and would need her daughter to go with her.  Was on Celexa for anxiety but has not been taking it.  Prefers something PRN but nothing that will cause drowsiness.  Reports no issue with depression despite pos PHQ9 screen today  HM:  Due for MMG, DM eye exam, colon CA screen, dexa scan. Depends on family members for transportation Patient Active Problem List   Diagnosis Date Noted   Positive colorectal cancer screening using Cologuard test 11/18/2021   Type 2 diabetes mellitus without complication, without long-term current use of insulin ([place]) 07/19/2021   Cellulitis of left lower extremity 06/01/2021   Acute back pain with sciatica, right 05/31/2021   Dermatitis 05/31/2021   Right sciatic nerve pain 05/30/2021   Rash and nonspecific skin eruption 05/30/2021   Acute midline low back pain 12/27/2016   Acute pain of both shoulders 12/27/2016   Rotator cuff impingement syndrome 12/27/2016   Essential hypertension 01/25/2016   Chronic pain syndrome 01/25/2016   Prediabetes 10/13/2015   Anxiety about health 09/13/2015  Neuropathy 09/13/2015   Chronic generalized pain 09/13/2015   History of seasonal allergies 09/13/2015   SAH (subarachnoid hemorrhage) ([place])    Subarachnoid hemorrhage ([place]) 02/04/2015   History of sinus disease on CT 08/10/2014   Hypertension, goal below 150/90 03/17/2014   Health care maintenance 03/17/2014   Tobacco abuse 03/17/2014   Neuropathy, upper extremity bilaterally  08/06/2013     Current Outpatient Medications on File Prior to Visit  Medication Sig Dispense Refill   albuterol (VENTOLIN HFA) 108 (90 Base) MCG/ACT inhaler Inhale 1-2 puffs into the lungs every 6 (six) hours as needed for wheezing or shortness of breath. 18 g 0   Blood Glucose  Monitoring Suppl (ONETOUCH VERIO) w/Device KIT Use to test blood sugar once daily. E11.9 1 kit 0   cetirizine (ZYRTEC) 10 MG tablet Take 1 tablet (10 mg total) by mouth daily. 90 tablet 1   fluticasone (FLONASE) 50 MCG/ACT nasal spray Place 2 sprays into both nostrils daily as needed for allergies or rhinitis. 1 g 5   fluticasone (FLONASE) 50 MCG/ACT nasal spray Place 1 spray into both nostrils daily. 16 g 0   gabapentin (NEURONTIN) 300 MG capsule Take 1 capsule (300 mg total) by mouth 3 (three) times daily. 270 capsule 1   glucose blood (ONETOUCH VERIO) test strip Use to check blood sugar once daily. E11.9 100 each 6   Lancet Devices (ONETOUCH DELICA PLUS LANCING) MISC Use to check blood sugar once daily. E11.9 1 each 6   OneTouch Delica Lancets 99991111 MISC Test blood sugars twice daily. E11.9 100 each 11   trolamine salicylate (ASPERCREME) 10 % cream Apply 1 application topically as needed for muscle pain (or sciatica).      No current facility-administered medications on file prior to visit.    Allergies  Allergen Reactions   Demerol Other (See Comments)    "I almost died"   Morphine And Related Other (See Comments)    "Almost Died"    Social History   Socioeconomic History   Marital status: Divorced    Spouse name: Not on file   Number of children: 3   Years of education: Not on file   Highest education level: High school graduate  Occupational History   Occupation: retired  Tobacco Use   Smoking status: Some Days    Packs/day: 0.00    Years: 20.00    Additional pack years: 0.00    Total pack years: 0.00    Types: Cigarettes   Smokeless tobacco: Never   Tobacco comments:    reports 1 pack per month  Vaping Use   Vaping Use: Never used  Substance and Sexual Activity   Alcohol use: Yes    Comment: occasionally   Drug use: No   Sexual activity: Not Currently  Other Topics Concern   Not on file  Social History Narrative   Lives alone   Right handed   Drinks 3-4 cups  of caffeine daily   Social Determinants of Health   Financial Resource Strain: Not on file  Food Insecurity: Not on file  Transportation Needs: Not on file  Physical Activity: Not on file  Stress: Not on file  Social Connections: Not on file  Intimate Partner Violence: Not on file    Family History  Problem Relation Age of Onset   Diabetes Mother    Heart failure Mother    Cancer Father     Past Surgical History:  Procedure Laterality Date   ABDOMINAL HYSTERECTOMY  CHOLECYSTECTOMY     CRANIOTOMY Right 02/04/2015   Procedure: RIGHT CRANIOTOMY FOR ANEURYSM ;  Surgeon: Consuella Lose, MD;  Location: [facility];  Service: Neurosurgery;  Laterality: Right;   IR GENERIC HISTORICAL  02/04/2015   IR ANGIO INTRA EXTRACRAN SEL COM CAROTID INNOMINATE BILAT MOD SED 02/04/2015 Consuella Lose, MD [facility]   IR GENERIC HISTORICAL  02/04/2015   IR ANGIO VERTEBRAL SEL SUBCLAVIAN INNOMINATE UNI R MOD SED 02/04/2015 Consuella Lose, MD [facility]   IR GENERIC HISTORICAL  02/04/2015   IR ANGIO VERTEBRAL SEL VERTEBRAL UNI L MOD SED 02/04/2015 Consuella Lose, MD [facility]    ROS: Review of Systems Negative except as stated above  PHYSICAL EXAM: BP (!) 153/77 (BP Location: Left Arm, Patient Position: Sitting, Cuff Size: Normal)   Pulse 80   Temp 98 F (36.7 C) (Oral)   Ht 4\' 10"  (1.473 m)   Wt 154 lb (69.9 kg)   SpO2 97%   BMI 32.19 kg/m   Wt Readings from Last 3 Encounters:  03/05/23 154 lb (69.9 kg)  05/10/22 148 lb (67.1 kg)  11/18/21 148 lb 3.2 oz (67.2 kg)  BP 160/80  Physical Exam  General appearance - alert, well appearing, and in no distress Mental status - normal mood, behavior, speech, dress, motor activity, and thought processes Neck - supple, no significant adenopathy Chest - clear to auscultation, no wheezes, rales or rhonchi, symmetric air entry Heart - normal rate, regular rhythm, normal S1, S2, no murmurs, rubs, clicks or gallops Extremities  - peripheral pulses normal, trace bilateral lower extremity edema.     Latest Ref Rng & Units 07/19/2021    2:18 PM 12/06/2020    2:07 PM 07/22/2020    1:41 PM  CMP  Glucose 70 - 99 mg/dL  81  125   BUN 8 - 23 mg/dL  10  10   Creatinine 0.44 - 1.00 mg/dL  0.57  0.60   Sodium 135 - 145 mmol/L  139  143   Potassium 3.5 - 5.1 mmol/L  4.2  3.5   Chloride 98 - 111 mmol/L  106  103   CO2 22 - 32 mmol/L  21    Calcium 8.9 - 10.3 mg/dL  8.8    Total Protein 6.0 - 8.5 g/dL 6.5  6.5    Total Bilirubin 0.0 - 1.2 mg/dL <0.2  0.4    Alkaline Phos 44 - 121 IU/L 85  76    AST 0 - 40 IU/L 17  28    ALT 0 - 32 IU/L 14  18     Lipid Panel     Component Value Date/Time   CHOL 155 07/19/2021 1418   TRIG 258 (H) 07/19/2021 1418   HDL 42 07/19/2021 1418   CHOLHDL 3.7 07/19/2021 1418   CHOLHDL 4.3 03/27/2017 1016   VLDL 26 03/27/2017 1016   LDLCALC 71 07/19/2021 1418    CBC    Component Value Date/Time   WBC 6.1 12/06/2020 1407   RBC 4.28 12/06/2020 1407   HGB 12.7 12/06/2020 1407   HCT 39.2 12/06/2020 1407   PLT 174 12/06/2020 1407   MCV 91.6 12/06/2020 1407   MCH 29.7 12/06/2020 1407   MCHC 32.4 12/06/2020 1407   RDW 14.2 12/06/2020 1407   LYMPHSABS 3.0 12/06/2020 1407   MONOABS 0.4 12/06/2020 1407   EOSABS 0.3 12/06/2020 1407   BASOSABS 0.0 12/06/2020 1407   Results for orders placed or performed in visit on 03/05/23  POCT glucose (manual entry)  Result Value Ref Range   POC Glucose 127 (A) 70 - 99 mg/dl  POCT glycosylated hemoglobin (Hb A1C)  Result Value Ref Range   Hemoglobin A1C     HbA1c POC (<> result, manual entry)     HbA1c, POC (prediabetic range)     HbA1c, POC (controlled diabetic range) 5.9 0.0 - 7.0 %  POCT URINALYSIS DIP (CLINITEK)  Result Value Ref Range   Color, UA yellow yellow   Clarity, UA clear clear   Glucose, UA negative negative mg/dL   Bilirubin, UA negative negative   Ketones, POC UA negative negative mg/dL   Spec Grav, UA 1.010 1.010 - 1.025    Blood, UA trace-intact (A) negative   pH, UA 7.0 5.0 - 8.0   POC PROTEIN,UA negative negative, trace   Urobilinogen, UA 0.2 0.2 or 1.0 E.U./dL   Nitrite, UA Negative Negative   Leukocytes, UA Small (1+) (A) Negative    ASSESSMENT AND PLAN:  1. Type 2 diabetes mellitus with other circulatory complications (HCC) At goal on diet control.  However she needs to do better with her eating habits. Encourage meal planning so that she is not eating out as much. Encouraged her to move is much as she can. - POCT glucose (manual entry) - POCT glycosylated hemoglobin (Hb A1C) - Comprehensive metabolic panel - Lipid panel - CBC - Microalbumin / creatinine urine ratio - Ambulatory referral to Ophthalmology - amLODipine (NORVASC) 10 MG tablet; TAKE 1 TABLET DAILY TO LOWER BLOOD PRESSURE  Dispense: 90 tablet; Refill: 1  2. Hypertension associated with diabetes ([place]) Not at goal.  She has not taken Norvasc as yet for today.  Refill given on Norvasc 10 mg daily.  She will continue current dose.  3. Tobacco abuse Advised to quit.  She plans to use nicotine lozenges that she has at home.  4. Acute cystitis without hematuria - POCT URINALYSIS DIP (CLINITEK) - sulfamethoxazole-trimethoprim (BACTRIM DS) 800-160 MG tablet; Take 1 tablet by mouth 2 (two) times daily.  Dispense: 10 tablet; Refill: 0  5. GAD (generalized anxiety disorder) Discussed with the patient that the medicines that we can prescribe for as needed use like hydroxyzine or clonazepam can cause drowsiness.  She decided that she would prefer to go back on the Celexa instead. - citalopram (CELEXA) 10 MG tablet; Take 1 tablet (10 mg total) by mouth daily.  Dispense: 90 tablet; Refill: 1  6. Positive colorectal cancer screening using Cologuard test Patient is agreeable to referral to referral to the gastroenterologist for colonoscopy.  She will arrange with her daughter to go with her.  7. Need for vaccination against Streptococcus  pneumoniae Patient agreeable to receiving pneumonia 20 vaccine today.  She wants to hold off on getting the shingles vaccine until next visit. - Pneumococcal conjugate vaccine 20-valent  8. Encounter for screening mammogram for malignant neoplasm of breast - MM Digital Screening; Future    Patient was given the opportunity to ask questions.  Patient verbalized understanding of the plan and was able to repeat key elements of the plan.   This documentation was completed using Radio producer.  Any transcriptional errors are unintentional.  Orders Placed This Encounter  Procedures   MM Digital Screening   Pneumococcal conjugate vaccine 20-valent   Comprehensive metabolic panel   Lipid panel   CBC   Microalbumin / creatinine urine ratio   Ambulatory referral to Ophthalmology   Ambulatory referral to Gastroenterology   POCT glucose (  manual entry)   POCT glycosylated hemoglobin (Hb A1C)   POCT URINALYSIS DIP (CLINITEK)     Requested Prescriptions   Signed Prescriptions Disp Refills   amLODipine (NORVASC) 10 MG tablet 90 tablet 1    Sig: TAKE 1 TABLET DAILY TO LOWER BLOOD PRESSURE   citalopram (CELEXA) 10 MG tablet 90 tablet 1    Sig: Take 1 tablet (10 mg total) by mouth daily.   sulfamethoxazole-trimethoprim (BACTRIM DS) 800-160 MG tablet 10 tablet 0    Sig: Take 1 tablet by mouth 2 (two) times daily.    Return in about 4 months (around 07/05/2023) for Give appt with CMA for Medicare Wellness in 2 wks.  Karle Plumber, MD, FACP

## 2023-03-06 LAB — COMPREHENSIVE METABOLIC PANEL
ALT: 15 IU/L (ref 0–32)
AST: 20 IU/L (ref 0–40)
Albumin/Globulin Ratio: 1.8 (ref 1.2–2.2)
Albumin: 4 g/dL (ref 3.9–4.9)
Alkaline Phosphatase: 107 IU/L (ref 44–121)
BUN/Creatinine Ratio: 16 (ref 12–28)
BUN: 12 mg/dL (ref 8–27)
Bilirubin Total: <0.2 mg/dL (ref 0.0–1.2)
CO2: 24 mmol/L (ref 20–29)
Calcium: 9 mg/dL (ref 8.7–10.3)
Chloride: 108 mmol/L — ABNORMAL HIGH (ref 96–106)
Creatinine, Ser: 0.73 mg/dL (ref 0.57–1.00)
Globulin, Total: 2.2 g/dL (ref 1.5–4.5)
Glucose: 86 mg/dL (ref 70–99)
Potassium: 3.8 mmol/L (ref 3.5–5.2)
Sodium: 146 mmol/L — ABNORMAL HIGH (ref 134–144)
Total Protein: 6.2 g/dL (ref 6.0–8.5)
eGFR: 90 mL/min/{1.73_m2} (ref >59)

## 2023-03-06 LAB — MICROALBUMIN / CREATININE URINE RATIO
Creatinine, Urine: 21.7 mg/dL
Microalb/Creat Ratio: 25 mg/g creat (ref 0–29)
Microalbumin, Urine: 5.5 ug/mL

## 2023-03-06 LAB — CBC
Hematocrit: 37.9 % (ref 34.0–46.6)
Hemoglobin: 12.6 g/dL (ref 11.1–15.9)
MCH: 29.9 pg (ref 26.6–33.0)
MCHC: 33.2 g/dL (ref 31.5–35.7)
MCV: 90 fL (ref 79–97)
Platelets: 179 10*3/uL (ref 150–450)
RBC: 4.22 x10E6/uL (ref 3.77–5.28)
RDW: 14.1 % (ref 11.7–15.4)
WBC: 5.6 10*3/uL (ref 3.4–10.8)

## 2023-03-06 LAB — LIPID PANEL
Chol/HDL Ratio: 3.5 ratio (ref 0.0–4.4)
Cholesterol, Total: 142 mg/dL (ref 100–199)
HDL: 41 mg/dL (ref >39)
LDL Chol Calc (NIH): 77 mg/dL (ref 0–99)
Triglycerides: 136 mg/dL (ref 0–149)
VLDL Cholesterol Cal: 24 mg/dL (ref 5–40)

## 2023-03-07 ENCOUNTER — Ambulatory Visit: Payer: Medicare (Managed Care) | Admitting: Physician Assistant

## 2023-03-08 ENCOUNTER — Ambulatory Visit: Payer: Self-pay

## 2023-03-08 ENCOUNTER — Ambulatory Visit: Payer: Medicare (Managed Care) | Attending: Physician Assistant | Admitting: Physician Assistant

## 2023-03-08 ENCOUNTER — Other Ambulatory Visit: Payer: Self-pay

## 2023-03-08 ENCOUNTER — Encounter: Payer: Self-pay | Admitting: Physician Assistant

## 2023-03-08 VITALS — BP 137/76 | HR 75 | Wt 153.0 lb

## 2023-03-08 DIAGNOSIS — R238 Other skin changes: Secondary | ICD-10-CM | POA: Diagnosis not present

## 2023-03-08 DIAGNOSIS — J309 Allergic rhinitis, unspecified: Secondary | ICD-10-CM

## 2023-03-08 DIAGNOSIS — R198 Other specified symptoms and signs involving the digestive system and abdomen: Secondary | ICD-10-CM | POA: Diagnosis not present

## 2023-03-08 DIAGNOSIS — M7989 Other specified soft tissue disorders: Secondary | ICD-10-CM

## 2023-03-08 MED ORDER — CETIRIZINE HCL 10 MG PO TABS
10.0000 mg | ORAL_TABLET | Freq: Every day | ORAL | 1 refills | Status: AC
  Filled 2023-03-08: qty 90, 90d supply, fill #0

## 2023-03-08 NOTE — Progress Notes (Signed)
Patient ID: Renee Murray, female   DOB: Mar 16, 1956, 67 y.o.   MRN: [id number]   Renee Murray, is a 66 y.o. female  [id number]  [id number]  DOB - 08/01/1956  Chief Complaint  Patient presents with   Mass       Subjective:   Renee Murray is a 67 y.o. female here today for pain, redness, and warmth after receiving pneumococcal vaccine on 03/05/2023.  Minimal discomfort and a little itching. No disseminated s/sx.  No SOB or wheezing  Also, has trouble swallowing citalopram and feels like it gets caught bc the pill is so small.      No problems updated.  ALLERGIES: Allergies  Allergen Reactions   Demerol Other (See Comments)    "I almost died"   Morphine And Related Other (See Comments)    "Almost Died"    PAST MEDICAL HISTORY: Past Medical History:  Diagnosis Date   Brain aneurysm    Diabetes mellitus without complication ([place])    Hypertension    Stroke ([place])    stroke like symptons/admitted 8/21    MEDICATIONS AT HOME: Prior to Admission medications   Medication Sig Start Date End Date Taking? Authorizing Provider  albuterol (VENTOLIN HFA) 108 (90 Base) MCG/ACT inhaler Inhale 1-2 puffs into the lungs every 6 (six) hours as needed for wheezing or shortness of breath. 11/22/22  Yes Raspet, Erin K, PA-C  amLODipine (NORVASC) 10 MG tablet TAKE 1 TABLET DAILY TO LOWER BLOOD PRESSURE 03/05/23  Yes Ladell Pier, MD  Blood Glucose Monitoring Suppl (ONETOUCH VERIO) w/Device KIT Use to test blood sugar once daily. E11.9 08/06/20  Yes Fulp, Cammie, MD  citalopram (CELEXA) 10 MG tablet Take 1 tablet (10 mg total) by mouth daily. 03/05/23  Yes Ladell Pier, MD  fluticasone (FLONASE) 50 MCG/ACT nasal spray Place 2 sprays into both nostrils daily as needed for allergies or rhinitis. 01/06/21  Yes Anaissa Macfadden M, PA-C  fluticasone (FLONASE) 50 MCG/ACT nasal spray Place 1 spray into both nostrils daily. 11/22/22  Yes Raspet, Erin K, PA-C  gabapentin (NEURONTIN) 300  MG capsule Take 1 capsule (300 mg total) by mouth 3 (three) times daily. 01/06/21  Yes Freeman Caldron M, PA-C  glucose blood (ONETOUCH VERIO) test strip Use to check blood sugar once daily. E11.9 08/06/20  Yes Fulp, Cammie, MD  Lancet Devices [facility] DELICA PLUS LANCING) MISC Use to check blood sugar once daily. E11.9 08/06/20  Yes Fulp, Cammie, MD  OneTouch Delica Lancets 99991111 MISC Test blood sugars twice daily. E11.9 08/03/20  Yes Fulp, Cammie, MD  sulfamethoxazole-trimethoprim (BACTRIM DS) 800-160 MG tablet Take 1 tablet by mouth 2 (two) times daily. 03/05/23  Yes Ladell Pier, MD  trolamine salicylate (ASPERCREME) 10 % cream Apply 1 application topically as needed for muscle pain (or sciatica).    Yes [provider]  cetirizine (ZYRTEC) 10 MG tablet Take 1 tablet (10 mg total) by mouth daily. 03/08/23   Cristi Gwynn, Dionne Bucy, PA-C    ROS: Neg HEENT Neg resp Neg cardiac Neg GI Neg GU Neg MS Neg psych Neg neuro  Objective:   Vitals:   03/08/23 1459  BP: 137/76  Pulse: 75  SpO2: 95%  Weight: 153 lb (69.4 kg)   Exam General appearance : Awake, alert, not in any distress. Speech Clear. Not toxic looking HEENT: Atraumatic and Normocephalic R arm just at and below R deltoid with warmth and mild erythema 3x5cm.  Rom normal.   Neck: Supple, no JVD. No cervical  lymphadenopathy.  Chest: Good air entry bilaterally, CTAB.  No rales/rhonchi/wheezing CVS: S1 S2 regular, no murmurs.  Extremities: B/L Lower Ext shows no edema, both legs are warm to touch Neurology: Awake alert, and oriented X 3, CN II-XII intact, Non focal Skin: No Rash  Data Review Lab Results  Component Value Date   HGBA1C 5.9 03/05/2023   HGBA1C 5.5 06/13/2022   HGBA1C CANCELED 05/31/2022    Assessment & Plan   1. Allergic rhinitis, unspecified seasonality, unspecified trigger - cetirizine (ZYRTEC) 10 MG tablet; Take 1 tablet (10 mg total) by mouth daily.  Dispense: 90 tablet; Refill: 1  2. Redness  and swelling of upper arm After pneumococcal vaccine.  I would say this is a typical/common reaction.  Mild to moderate.   - cetirizine (ZYRTEC) 10 MG tablet; Take 1 tablet (10 mg total) by mouth daily.  Dispense: 90 tablet; Refill: 1 Tylenol or advil for discomfort Handout on vaccine given and went over typical vs severe reactions.  Patient verbalized understanding  3. Difficulty swallowing pills You may crush the tables or put them in food such as applesauce, etc.      Return if symptoms worsen or fail to improve.  The patient was given clear instructions to go to ER or return to medical center if symptoms don't improve, worsen or new problems develop. The patient verbalized understanding. The patient was told to call to get lab results if they haven't heard anything in the next week.      Freeman Caldron, PA-C [facility] [place], [place]   03/08/2023, 3:13 PM

## 2023-03-08 NOTE — Patient Instructions (Addendum)
Take cetirizine daily to minimize reaction to vaccine.  Use tylenol or advil for discomfort.   You can use a pill crusher for citalopram and put in food/applesauce, etc

## 2023-03-08 NOTE — Telephone Encounter (Signed)
  Chief Complaint: Hot swollen lump at vaccine injection site Symptoms: Hot swollen painful lump Frequency: 03/05/2023 Pertinent Negatives: Patient denies Fever Disposition: [] ED /[] Urgent Care (no appt availability in office) / [x] Appointment(In office/virtual)/ []  [place] [facility]/ [] Home Care/ [] Refused Recommended Disposition /[] [place] [facility]/ []  Follow-up with PCP Additional Notes: Pt had a pneumonia vaccine on 03/05/2023. Pt states that the location of injection is hot, swollen and painful.   Reason for Disposition  [1] Looks infected AND [2] large red area (> 2 inches or 5 cm) or streak  Answer Assessment - Initial Assessment Questions 1. APPEARANCE of INJURY: "What does the injury look like?"      Right arm - lump, red and swollen 2. SIZE: "How large is the cut?"      Swollen area is the size of her palm 3. BLEEDING: "Is it bleeding now?" If Yes, ask: "Is it difficult to stop?"       4. LOCATION: "Where is the injury located?"      Right arm 5. ONSET: "How long ago did the injury occur?"      03/05/2023 6. MECHANISM: "Tell me how it happened."      Pneumonia shot 7. TETANUS: "When was the last tetanus booster?"  Protocols used: Skin Injury-A-AH

## 2023-03-23 ENCOUNTER — Ambulatory Visit: Payer: Medicare (Managed Care) | Attending: Internal Medicine

## 2023-07-16 ENCOUNTER — Ambulatory Visit: Payer: Medicare (Managed Care) | Admitting: Internal Medicine

## 2023-08-03 ENCOUNTER — Ambulatory Visit (HOSPITAL_COMMUNITY)
Admission: EM | Admit: 2023-08-03 | Discharge: 2023-08-03 | Disposition: A | Discharge status: Home / Self Care | Payer: Medicare (Managed Care) | Source: Home / Self Care

## 2023-08-03 ENCOUNTER — Encounter (HOSPITAL_COMMUNITY): Payer: Self-pay

## 2023-08-03 DIAGNOSIS — N3001 Acute cystitis with hematuria: Secondary | ICD-10-CM | POA: Insufficient documentation

## 2023-08-03 LAB — POCT URINALYSIS DIP (MANUAL ENTRY)
Bilirubin, UA: NEGATIVE
Glucose, UA: NEGATIVE mg/dL
Ketones, POC UA: NEGATIVE mg/dL
Nitrite, UA: NEGATIVE
Protein Ur, POC: NEGATIVE mg/dL
Spec Grav, UA: 1.01 (ref 1.010–1.025)
Urobilinogen, UA: 0.2 E.U./dL
pH, UA: 7 (ref 5.0–8.0)

## 2023-08-03 MED ORDER — CEPHALEXIN 500 MG PO CAPS: 500.0000 mg | ORAL_CAPSULE | Freq: Two times a day (BID) | ORAL | 0 refills | Status: AC

## 2023-08-03 NOTE — ED Triage Notes (Signed)
Pt states frequent urination and discomfort when urinating for the past 4 days.

## 2023-08-03 NOTE — Discharge Instructions (Addendum)
Take the Keflex as prescribed to treat UTI.  Make sure you drink plenty of water.  We will contact you if the urine culture shows we need to change the antibiotic.

## 2023-08-03 NOTE — ED Provider Notes (Signed)
[facility]    CSN: [id number] Arrival date & time: 08/03/23  1201      History   Chief Complaint Chief Complaint  Patient presents with   Dysuria    HPI Renee Murray is a 67 y.o. female.   Patient presents today for 4-day history of urinary frequency, voiding smaller amounts, and discomfort in her lower abdomen with urination.  She denies dysuria, urinary urgency, new urinary incontinence, foul urinary odor, hematuria, new back or flank pain.  No fever, nausea/vomiting, or vaginal discharge.  Has been increasing water intake and taking cranberry juice without much improvement.  Reports history of UTI, more than 1 year ago.  Denies allergies to antibiotic therapy or antibiotic use in the past 90 days.    Past Medical History:  Diagnosis Date   Brain aneurysm    Diabetes mellitus without complication (HCC)    Hypertension    Stroke (HCC)    stroke like symptons/admitted 8/21    Patient Active Problem List   Diagnosis Date Noted   Positive colorectal cancer screening using Cologuard test 11/18/2021   Type 2 diabetes mellitus without complication, without long-term current use of insulin (HCC) 07/19/2021   Cellulitis of left lower extremity 06/01/2021   Acute back pain with sciatica, right 05/31/2021   Dermatitis 05/31/2021   Right sciatic nerve pain 05/30/2021   Rash and nonspecific skin eruption 05/30/2021   Acute midline low back pain 12/27/2016   Acute pain of both shoulders 12/27/2016   Rotator cuff impingement syndrome 12/27/2016   Essential hypertension 01/25/2016   Chronic pain syndrome 01/25/2016   Prediabetes 10/13/2015   Anxiety about health 09/13/2015   Neuropathy 09/13/2015   Chronic generalized pain 09/13/2015   History of seasonal allergies 09/13/2015   SAH (subarachnoid hemorrhage) (HCC)    Subarachnoid hemorrhage (HCC) 02/04/2015   History of sinus disease on CT 08/10/2014   Hypertension, goal below 150/90 03/17/2014   Health care  maintenance 03/17/2014   Tobacco abuse 03/17/2014   Neuropathy, upper extremity bilaterally  08/06/2013    Past Surgical History:  Procedure Laterality Date   CHOLECYSTECTOMY     CRANIOTOMY Right 02/04/2015   Procedure: RIGHT CRANIOTOMY FOR ANEURYSM ;  Surgeon: Lisbeth Renshaw, MD;  Location: [facility];  Service: Neurosurgery;  Laterality: Right;   IR GENERIC HISTORICAL  02/04/2015   IR ANGIO INTRA EXTRACRAN SEL COM CAROTID INNOMINATE BILAT MOD SED 02/04/2015 Lisbeth Renshaw, MD [facility]   IR GENERIC HISTORICAL  02/04/2015   IR ANGIO VERTEBRAL SEL SUBCLAVIAN INNOMINATE UNI R MOD SED 02/04/2015 Lisbeth Renshaw, MD [facility]   IR GENERIC HISTORICAL  02/04/2015   IR ANGIO VERTEBRAL SEL VERTEBRAL UNI L MOD SED 02/04/2015 Lisbeth Renshaw, MD [facility]   TOTAL ABDOMINAL HYSTERECTOMY      OB History   No obstetric history on file.      Home Medications    Prior to Admission medications   Medication Sig Start Date End Date Taking? Authorizing Provider  cephALEXin (KEFLEX) 500 MG capsule Take 1 capsule (500 mg total) by mouth 2 (two) times daily for 5 days. 08/03/23 08/08/23 Yes Valentino Nose, NP  albuterol (VENTOLIN HFA) 108 (90 Base) MCG/ACT inhaler Inhale 1-2 puffs into the lungs every 6 (six) hours as needed for wheezing or shortness of breath. 11/22/22   Raspet, Noberto Retort, PA-C  amLODipine (NORVASC) 10 MG tablet TAKE 1 TABLET DAILY TO LOWER BLOOD PRESSURE 03/05/23   Marcine Matar, MD  Blood Glucose  Monitoring Suppl (ONETOUCH VERIO) w/Device KIT Use to test blood sugar once daily. E11.9 08/06/20   Fulp, Cammie, MD  cetirizine (ZYRTEC) 10 MG tablet Take 1 tablet (10 mg total) by mouth daily. 03/08/23   Anders Simmonds, PA-C  citalopram (CELEXA) 10 MG tablet Take 1 tablet (10 mg total) by mouth daily. 03/05/23   Marcine Matar, MD  fluticasone (FLONASE) 50 MCG/ACT nasal spray Place 2 sprays into both nostrils daily as needed for allergies or rhinitis. 01/06/21    Anders Simmonds, PA-C  fluticasone (FLONASE) 50 MCG/ACT nasal spray Place 1 spray into both nostrils daily. 11/22/22   Raspet, Noberto Retort, PA-C  gabapentin (NEURONTIN) 300 MG capsule Take 1 capsule (300 mg total) by mouth 3 (three) times daily. 01/06/21   Anders Simmonds, PA-C  glucose blood (ONETOUCH VERIO) test strip Use to check blood sugar once daily. E11.9 08/06/20   Fulp, Hewitt Shorts, MD  Lancet Devices [facility] DELICA PLUS LANCING) MISC Use to check blood sugar once daily. E11.9 08/06/20   Fulp, Hewitt Shorts, MD  OneTouch Delica Lancets 33G MISC Test blood sugars twice daily. E11.9 08/03/20   Fulp, Cammie, MD  sulfamethoxazole-trimethoprim (BACTRIM DS) 800-160 MG tablet Take 1 tablet by mouth 2 (two) times daily. 03/05/23   Marcine Matar, MD  trolamine salicylate (ASPERCREME) 10 % cream Apply 1 application topically as needed for muscle pain (or sciatica).     [provider]    Family History Family History  Problem Relation Age of Onset   Diabetes Mother    Heart failure Mother    Cancer Father     Social History Social History   Tobacco Use   Smoking status: Some Days    Current packs/day: 0.00    Types: Cigarettes   Smokeless tobacco: Never   Tobacco comments:    reports 1 pack per month  Vaping Use   Vaping status: Never Used  Substance Use Topics   Alcohol use: Yes    Comment: occasionally   Drug use: No     Allergies   Demerol and Morphine and codeine   Review of Systems Review of Systems Per HPI  Physical Exam Triage Vital Signs ED Triage Vitals  Encounter Vitals Group     BP 08/03/23 1304 (!) 161/62     Systolic BP Percentile --      Diastolic BP Percentile --      Pulse Rate 08/03/23 1304 71     Resp 08/03/23 1304 16     Temp 08/03/23 1304 97.9 F (36.6 C)     Temp Source 08/03/23 1304 Oral     SpO2 08/03/23 1304 96 %     Weight --      Height --      Head Circumference --      Peak Flow --      Pain Score 08/03/23 1306 0     Pain Loc --       Pain Education --      Exclude from Growth Chart --    No data found.  Updated Vital Signs BP (!) 161/62 (BP Location: Left Arm)   Pulse 71   Temp 97.9 F (36.6 C) (Oral)   Resp 16   SpO2 96%   Visual Acuity Right Eye Distance:   Left Eye Distance:   Bilateral Distance:    Right Eye Near:   Left Eye Near:    Bilateral Near:     Physical Exam Vitals and nursing  note reviewed.  Constitutional:      General: She is not in acute distress.    Appearance: She is not toxic-appearing.  Pulmonary:     Effort: Pulmonary effort is normal. No respiratory distress.  Abdominal:     General: Abdomen is flat. Bowel sounds are normal. There is no distension.     Palpations: Abdomen is soft. There is no mass.     Tenderness: There is no abdominal tenderness. There is no right CVA tenderness, left CVA tenderness or guarding.  Skin:    General: Skin is warm and dry.     Coloration: Skin is not jaundiced or pale.     Findings: No erythema.  Neurological:     Mental Status: She is alert and oriented to person, place, and time.  Psychiatric:        Behavior: Behavior is cooperative.      UC Treatments / Results  Labs (all labs ordered are listed, but only abnormal results are displayed) Labs Reviewed  POCT URINALYSIS DIP (MANUAL ENTRY) - Abnormal; Notable for the following components:      Result Value   Blood, UA moderate (*)    Leukocytes, UA Small (1+) (*)    All other components within normal limits  URINE CULTURE    EKG   Radiology No results found.  Procedures Procedures (including critical care time)  Medications Ordered in UC Medications - No data to display  Initial Impression / Assessment and Plan / UC Course  I have reviewed the triage vital signs and the nursing notes.  Pertinent labs & imaging results that were available during my care of the patient were reviewed by me and considered in my medical decision making (see chart for details).   Patient  is well-appearing, afebrile, not tachycardic, not tachypneic, oxygenating well on room air.  Patient is mildly hypertensive in urgent care today.  1. Acute cystitis with hematuria Urinalysis today shows moderate blood, small leukocyte Estrace Urine culture pending Will treat for acute UTI with Keflex twice daily for 5 days; recent GFR > 90, no dosage adjustment needed Strict ER and return precautions discussed with patient  The patient was given the opportunity to ask questions.  All questions answered to their satisfaction.  The patient is in agreement to this plan.    Final Clinical Impressions(s) / UC Diagnoses   Final diagnoses:  Acute cystitis with hematuria     Discharge Instructions      Take the Keflex as prescribed to treat UTI.  Make sure you drink plenty of water.  We will contact you if the urine culture shows we need to change the antibiotic.     ED Prescriptions     Medication Sig Dispense Auth. Provider   cephALEXin (KEFLEX) 500 MG capsule Take 1 capsule (500 mg total) by mouth 2 (two) times daily for 5 days. 10 capsule Valentino Nose, NP      PDMP not reviewed this encounter.   Valentino Nose, NP 08/03/23 1431

## 2023-08-04 LAB — URINE CULTURE: Culture: NO GROWTH

## 2023-08-25 ENCOUNTER — Other Ambulatory Visit: Payer: Self-pay | Admitting: Internal Medicine

## 2023-08-25 DIAGNOSIS — E1159 Type 2 diabetes mellitus with other circulatory complications: Secondary | ICD-10-CM

## 2023-08-25 DIAGNOSIS — F411 Generalized anxiety disorder: Secondary | ICD-10-CM

## 2023-08-28 ENCOUNTER — Ambulatory Visit
Admission: RE | Admit: 2023-08-28 | Discharge: 2023-08-28 | Disposition: A | Discharge status: Home / Self Care | Payer: Medicare (Managed Care) | Source: Ambulatory Visit | Attending: Physician Assistant | Admitting: Physician Assistant

## 2023-08-28 VITALS — BP 134/78 | HR 70 | Temp 97.9°F | Resp 18 | Ht 59.0 in | Wt 155.4 lb

## 2023-08-28 DIAGNOSIS — N3000 Acute cystitis without hematuria: Secondary | ICD-10-CM | POA: Insufficient documentation

## 2023-08-28 LAB — POCT URINALYSIS DIP (MANUAL ENTRY)
Bilirubin, UA: NEGATIVE
Glucose, UA: NEGATIVE mg/dL
Ketones, POC UA: NEGATIVE mg/dL
Nitrite, UA: NEGATIVE
Protein Ur, POC: NEGATIVE mg/dL
Spec Grav, UA: 1.015 (ref 1.010–1.025)
Urobilinogen, UA: 0.2 U/dL
pH, UA: 7 (ref 5.0–8.0)

## 2023-08-28 MED ORDER — CIPROFLOXACIN HCL 500 MG PO TABS
500.0000 mg | ORAL_TABLET | Freq: Two times a day (BID) | ORAL | 0 refills | Status: DC
Start: 2023-08-28 — End: 2023-09-05

## 2023-08-28 NOTE — ED Triage Notes (Signed)
"  I think I have a UTI". "I consistently have this recurrent irritation and causing urine to be cloudy". No fever. No vaginal discharge. "I had a visit with my PCP in Aug. 2024 and thought I had a UTI but Culture showed I did not".

## 2023-08-29 LAB — URINE CULTURE: Culture: NO GROWTH

## 2023-08-31 NOTE — ED Provider Notes (Signed)
[facility]    CSN: [id number] Arrival date & time: 08/28/23  1415      History   Chief Complaint Chief Complaint  Patient presents with   UTI Symptoms    Appt    HPI Renee Murray is a 67 y.o. female.   Patient here today for evaluation of suspected UTI.  She states she consistently has irritation in that area and urine has been cloudy.  She has not any fever.  She has not any vaginal discharge.  She denies any abdominal pain.  She does not report treatment for symptoms.  The history is provided by the patient.    Past Medical History:  Diagnosis Date   Brain aneurysm    Diabetes mellitus without complication (HCC)    Hypertension    Stroke (HCC)    stroke like symptons/admitted 8/21    Patient Active Problem List   Diagnosis Date Noted   Positive colorectal cancer screening using Cologuard test 11/18/2021   Type 2 diabetes mellitus without complication, without long-term current use of insulin (HCC) 07/19/2021   Cellulitis of left lower extremity 06/01/2021   Acute back pain with sciatica, right 05/31/2021   Dermatitis 05/31/2021   Right sciatic nerve pain 05/30/2021   Rash and nonspecific skin eruption 05/30/2021   Acute midline low back pain 12/27/2016   Acute pain of both shoulders 12/27/2016   Rotator cuff impingement syndrome 12/27/2016   Essential hypertension 01/25/2016   Chronic pain syndrome 01/25/2016   Prediabetes 10/13/2015   Anxiety about health 09/13/2015   Neuropathy 09/13/2015   Chronic generalized pain 09/13/2015   History of seasonal allergies 09/13/2015   SAH (subarachnoid hemorrhage) (HCC)    Subarachnoid hemorrhage (HCC) 02/04/2015   History of sinus disease on CT 08/10/2014   Hypertension, goal below 150/90 03/17/2014   Health care maintenance 03/17/2014   Tobacco abuse 03/17/2014   Neuropathy, upper extremity bilaterally  08/06/2013    Past Surgical History:  Procedure Laterality Date   CHOLECYSTECTOMY      CRANIOTOMY Right 02/04/2015   Procedure: RIGHT CRANIOTOMY FOR ANEURYSM ;  Surgeon: Lisbeth Renshaw, MD;  Location: [facility];  Service: Neurosurgery;  Laterality: Right;   IR GENERIC HISTORICAL  02/04/2015   IR ANGIO INTRA EXTRACRAN SEL COM CAROTID INNOMINATE BILAT MOD SED 02/04/2015 Lisbeth Renshaw, MD [facility]   IR GENERIC HISTORICAL  02/04/2015   IR ANGIO VERTEBRAL SEL SUBCLAVIAN INNOMINATE UNI R MOD SED 02/04/2015 Lisbeth Renshaw, MD [facility]   IR GENERIC HISTORICAL  02/04/2015   IR ANGIO VERTEBRAL SEL VERTEBRAL UNI L MOD SED 02/04/2015 Lisbeth Renshaw, MD [facility]   TOTAL ABDOMINAL HYSTERECTOMY      OB History   No obstetric history on file.      Home Medications    Prior to Admission medications   Medication Sig Start Date End Date Taking? Authorizing Provider  amLODipine (NORVASC) 10 MG tablet TAKE 1 TABLET DAILY TO LOWER BLOOD PRESSURE 03/05/23  Yes Marcine Matar, MD  cetirizine (ZYRTEC) 10 MG tablet Take 1 tablet (10 mg total) by mouth daily. 03/08/23  Yes Georgian Co M, PA-C  ciprofloxacin (CIPRO) 500 MG tablet Take 1 tablet (500 mg total) by mouth every 12 (twelve) hours. 08/28/23  Yes Tomi Bamberger, PA-C  citalopram (CELEXA) 10 MG tablet Take 1 tablet (10 mg total) by mouth daily. 03/05/23  Yes Marcine Matar, MD  fluticasone (FLONASE) 50 MCG/ACT nasal spray Place 2 sprays into both nostrils daily as  needed for allergies or rhinitis. 01/06/21  Yes Anders Simmonds, PA-C  gabapentin (NEURONTIN) 300 MG capsule Take 1 capsule (300 mg total) by mouth 3 (three) times daily. 01/06/21  Yes Georgian Co M, PA-C  trolamine salicylate (ASPERCREME) 10 % cream Apply 1 application topically as needed for muscle pain (or sciatica).    Yes [provider]  albuterol (VENTOLIN HFA) 108 (90 Base) MCG/ACT inhaler Inhale 1-2 puffs into the lungs every 6 (six) hours as needed for wheezing or shortness of breath. 11/22/22   Raspet, Noberto Retort, PA-C  Blood  Glucose Monitoring Suppl (ONETOUCH VERIO) w/Device KIT Use to test blood sugar once daily. E11.9 08/06/20   Fulp, Cammie, MD  fluticasone (FLONASE) 50 MCG/ACT nasal spray Place 1 spray into both nostrils daily. 11/22/22   Raspet, Erin K, PA-C  glucose blood (ONETOUCH VERIO) test strip Use to check blood sugar once daily. E11.9 08/06/20   Fulp, Hewitt Shorts, MD  Lancet Devices [facility] DELICA PLUS LANCING) MISC Use to check blood sugar once daily. E11.9 08/06/20   Fulp, Hewitt Shorts, MD  OneTouch Delica Lancets 33G MISC Test blood sugars twice daily. E11.9 08/03/20   Cain Saupe, MD    Family History Family History  Problem Relation Age of Onset   Diabetes Mother    Heart failure Mother    Cancer Father     Social History Social History   Tobacco Use   Smoking status: Some Days    Current packs/day: 0.00    Types: Cigarettes   Smokeless tobacco: Never   Tobacco comments:    reports 1 pack per month  Vaping Use   Vaping status: Never Used  Substance Use Topics   Alcohol use: Yes    Comment: Rarely.   Drug use: No     Allergies   Demerol and Morphine and codeine   Review of Systems Review of Systems  Constitutional:  Negative for chills and fever.  Eyes:  Negative for discharge and redness.  Gastrointestinal:  Negative for abdominal pain, nausea and vomiting.  Genitourinary:  Negative for dysuria and vaginal discharge.     Physical Exam Triage Vital Signs ED Triage Vitals  Encounter Vitals Group     BP 08/28/23 1441 (!) 154/71     Systolic BP Percentile --      Diastolic BP Percentile --      Pulse Rate 08/28/23 1441 70     Resp 08/28/23 1441 18     Temp 08/28/23 1441 97.9 F (36.6 C)     Temp Source 08/28/23 1441 Oral     SpO2 08/28/23 1441 96 %     Weight 08/28/23 1436 155 lb 6.4 oz (70.5 kg)     Height 08/28/23 1436 4\' 11"  (1.499 m)     Head Circumference --      Peak Flow --      Pain Score 08/28/23 1433 0     Pain Loc --      Pain Education --      Exclude from  Growth Chart --    No data found.  Updated Vital Signs BP 134/78 (BP Location: Right Arm)   Pulse 70   Temp 97.9 F (36.6 C) (Oral)   Resp 18   Ht 4\' 11"  (1.499 m)   Wt 155 lb 6.4 oz (70.5 kg)   SpO2 96%   BMI 31.39 kg/m     Physical Exam Vitals and nursing note reviewed.  Constitutional:      General: She  is not in acute distress.    Appearance: Normal appearance. She is not ill-appearing.  HENT:     Head: Normocephalic and atraumatic.  Eyes:     Conjunctiva/sclera: Conjunctivae normal.  Cardiovascular:     Rate and Rhythm: Normal rate.  Pulmonary:     Effort: Pulmonary effort is normal. No respiratory distress.  Neurological:     Mental Status: She is alert.  Psychiatric:        Mood and Affect: Mood normal.        Behavior: Behavior normal.        Thought Content: Thought content normal.      UC Treatments / Results  Labs (all labs ordered are listed, but only abnormal results are displayed) Labs Reviewed  POCT URINALYSIS DIP (MANUAL ENTRY) - Abnormal; Notable for the following components:      Result Value   Blood, UA trace-intact (*)    Leukocytes, UA Small (1+) (*)    All other components within normal limits  URINE CULTURE    EKG   Radiology No results found.  Procedures Procedures (including critical care time)  Medications Ordered in UC Medications - No data to display  Initial Impression / Assessment and Plan / UC Course  I have reviewed the triage vital signs and the nursing notes.  Pertinent labs & imaging results that were available during my care of the patient were reviewed by me and considered in my medical decision making (see chart for details).    Cipro prescribed to cover UTI given urine results.  Will order urine culture.  Recommended follow-up if no gradual improvement or with any further concerns.  Final Clinical Impressions(s) / UC Diagnoses   Final diagnoses:  Acute cystitis without hematuria   Discharge  Instructions   None    ED Prescriptions     Medication Sig Dispense Auth. Provider   ciprofloxacin (CIPRO) 500 MG tablet Take 1 tablet (500 mg total) by mouth every 12 (twelve) hours. 10 tablet Tomi Bamberger, PA-C      PDMP not reviewed this encounter.   Tomi Bamberger, PA-C 08/31/23 1723

## 2023-09-05 ENCOUNTER — Other Ambulatory Visit: Payer: Self-pay

## 2023-09-05 ENCOUNTER — Observation Stay (HOSPITAL_COMMUNITY): Payer: Medicare (Managed Care)

## 2023-09-05 ENCOUNTER — Encounter (HOSPITAL_COMMUNITY): Payer: Self-pay | Admitting: Emergency Medicine

## 2023-09-05 ENCOUNTER — Other Ambulatory Visit (HOSPITAL_COMMUNITY): Payer: Medicare (Managed Care)

## 2023-09-05 ENCOUNTER — Observation Stay (HOSPITAL_BASED_OUTPATIENT_CLINIC_OR_DEPARTMENT_OTHER): Payer: Medicare (Managed Care)

## 2023-09-05 ENCOUNTER — Observation Stay (HOSPITAL_COMMUNITY)
Admission: EM | Admit: 2023-09-05 | Discharge: 2023-09-06 | Disposition: A | Discharge status: Home / Self Care | Payer: Medicare (Managed Care) | Attending: Student in an Organized Health Care Education/Training Program | Admitting: Student in an Organized Health Care Education/Training Program

## 2023-09-05 ENCOUNTER — Emergency Department (HOSPITAL_COMMUNITY): Payer: Medicare (Managed Care)

## 2023-09-05 DIAGNOSIS — G459 Transient cerebral ischemic attack, unspecified: Secondary | ICD-10-CM

## 2023-09-05 DIAGNOSIS — E669 Obesity, unspecified: Secondary | ICD-10-CM | POA: Diagnosis present

## 2023-09-05 DIAGNOSIS — Z79899 Other long term (current) drug therapy: Secondary | ICD-10-CM | POA: Insufficient documentation

## 2023-09-05 DIAGNOSIS — R569 Unspecified convulsions: Principal | ICD-10-CM

## 2023-09-05 DIAGNOSIS — E119 Type 2 diabetes mellitus without complications: Secondary | ICD-10-CM

## 2023-09-05 DIAGNOSIS — F17210 Nicotine dependence, cigarettes, uncomplicated: Secondary | ICD-10-CM | POA: Insufficient documentation

## 2023-09-05 DIAGNOSIS — I671 Cerebral aneurysm, nonruptured: Secondary | ICD-10-CM | POA: Diagnosis present

## 2023-09-05 DIAGNOSIS — I10 Essential (primary) hypertension: Secondary | ICD-10-CM | POA: Diagnosis present

## 2023-09-05 DIAGNOSIS — R531 Weakness: Secondary | ICD-10-CM | POA: Diagnosis present

## 2023-09-05 DIAGNOSIS — D696 Thrombocytopenia, unspecified: Secondary | ICD-10-CM | POA: Diagnosis present

## 2023-09-05 DIAGNOSIS — Z720 Tobacco use: Secondary | ICD-10-CM | POA: Diagnosis present

## 2023-09-05 LAB — DIFFERENTIAL
Abs Immature Granulocytes: 0.02 10*3/uL (ref 0.00–0.07)
Basophils Absolute: 0.1 10*3/uL (ref 0.0–0.1)
Basophils Relative: 1 %
Eosinophils Absolute: 0.4 10*3/uL (ref 0.0–0.5)
Eosinophils Relative: 5 %
Immature Granulocytes: 0 %
Lymphocytes Relative: 54 %
Lymphs Abs: 3.6 10*3/uL (ref 0.7–4.0)
Monocytes Absolute: 0.4 10*3/uL (ref 0.1–1.0)
Monocytes Relative: 6 %
Neutro Abs: 2.3 10*3/uL (ref 1.7–7.7)
Neutrophils Relative %: 34 %

## 2023-09-05 LAB — COMPREHENSIVE METABOLIC PANEL WITH GFR
ALT: 19 U/L (ref 0–44)
AST: 27 U/L (ref 15–41)
Albumin: 3.5 g/dL (ref 3.5–5.0)
Alkaline Phosphatase: 102 U/L (ref 38–126)
Anion gap: 11 (ref 5–15)
BUN: 9 mg/dL (ref 8–23)
CO2: 25 mmol/L (ref 22–32)
Calcium: 8.9 mg/dL (ref 8.9–10.3)
Chloride: 105 mmol/L (ref 98–111)
Creatinine, Ser: 0.64 mg/dL (ref 0.44–1.00)
GFR, Estimated: >60 mL/min (ref >60)
Glucose, Bld: 98 mg/dL (ref 70–99)
Potassium: 4.2 mmol/L (ref 3.5–5.1)
Sodium: 141 mmol/L (ref 135–145)
Total Bilirubin: 0.6 mg/dL (ref 0.3–1.2)
Total Protein: 6.8 g/dL (ref 6.5–8.1)

## 2023-09-05 LAB — URINALYSIS, ROUTINE W REFLEX MICROSCOPIC
Bilirubin Urine: NEGATIVE
Glucose, UA: NEGATIVE mg/dL
Hgb urine dipstick: NEGATIVE
Ketones, ur: NEGATIVE mg/dL
Nitrite: NEGATIVE
Protein, ur: NEGATIVE mg/dL
Specific Gravity, Urine: <1.005 — ABNORMAL LOW (ref 1.005–1.030)
pH: 6 (ref 5.0–8.0)

## 2023-09-05 LAB — ECHOCARDIOGRAM COMPLETE
AR max vel: 2.23 cm2
AV Area VTI: 2.31 cm2
AV Area mean vel: 2.24 cm2
AV Mean grad: 7 mmHg
AV Peak grad: 12.7 mmHg
Ao pk vel: 1.78 m/s
Area-P 1/2: 3.99 cm2
Height: 59 in
P 1/2 time: 669 ms
S' Lateral: 2 cm
Weight: 2469.15 [oz_av]

## 2023-09-05 LAB — PROTIME-INR
INR: 0.9 (ref 0.8–1.2)
Prothrombin Time: 12.5 s (ref 11.4–15.2)

## 2023-09-05 LAB — LIPID PANEL
Cholesterol: 139 mg/dL (ref 0–200)
HDL: 41 mg/dL (ref >40)
LDL Cholesterol: 85 mg/dL (ref 0–99)
Total CHOL/HDL Ratio: 3.4 ratio
Triglycerides: 67 mg/dL (ref <150)
VLDL: 13 mg/dL (ref 0–40)

## 2023-09-05 LAB — RAPID URINE DRUG SCREEN, HOSP PERFORMED
Amphetamines: NOT DETECTED
Barbiturates: NOT DETECTED
Benzodiazepines: NOT DETECTED
Cocaine: NOT DETECTED
Opiates: NOT DETECTED
Tetrahydrocannabinol: NOT DETECTED

## 2023-09-05 LAB — CBC
HCT: 41.2 % (ref 36.0–46.0)
Hemoglobin: 13.3 g/dL (ref 12.0–15.0)
MCH: 29.5 pg (ref 26.0–34.0)
MCHC: 32.3 g/dL (ref 30.0–36.0)
MCV: 91.4 fL (ref 80.0–100.0)
Platelets: 76 10*3/uL — ABNORMAL LOW (ref 150–400)
RBC: 4.51 MIL/uL (ref 3.87–5.11)
RDW: 14.5 % (ref 11.5–15.5)
WBC: 6.7 10*3/uL (ref 4.0–10.5)
nRBC: 0.3 % — ABNORMAL HIGH (ref 0.0–0.2)

## 2023-09-05 LAB — URINALYSIS, MICROSCOPIC (REFLEX): Bacteria, UA: NONE SEEN

## 2023-09-05 LAB — HEMOGLOBIN A1C
Hgb A1c MFr Bld: 6.4 % — ABNORMAL HIGH (ref 4.8–5.6)
Mean Plasma Glucose: 137 mg/dL

## 2023-09-05 LAB — MAGNESIUM: Magnesium: 2 mg/dL (ref 1.7–2.4)

## 2023-09-05 LAB — ETHANOL: Alcohol, Ethyl (B): <10 mg/dL (ref <10)

## 2023-09-05 LAB — APTT: aPTT: 26 s (ref 24–36)

## 2023-09-05 LAB — TSH: TSH: 1.788 u[IU]/mL (ref 0.350–4.500)

## 2023-09-05 MED ORDER — STROKE: EARLY STAGES OF RECOVERY BOOK
Freq: Once | Status: AC
  Filled 2023-09-05: qty 1

## 2023-09-05 MED ORDER — SODIUM CHLORIDE 0.9 % IV SOLN: INTRAVENOUS | Status: AC

## 2023-09-05 MED ORDER — IOHEXOL 350 MG/ML SOLN
75.0000 mL | Freq: Once | INTRAVENOUS | Status: AC | PRN
  Administered 2023-09-05: 75 mL via INTRAVENOUS

## 2023-09-05 MED ORDER — LEVETIRACETAM 500 MG PO TABS
500.0000 mg | ORAL_TABLET | Freq: Two times a day (BID) | ORAL | Status: DC
  Administered 2023-09-06: 500 mg via ORAL
  Filled 2023-09-05 (×2): qty 1

## 2023-09-05 MED ORDER — ORAL CARE MOUTH RINSE: 15.0000 mL | OROMUCOSAL | Status: DC | PRN

## 2023-09-05 MED ORDER — GABAPENTIN 300 MG PO CAPS
300.0000 mg | ORAL_CAPSULE | Freq: Three times a day (TID) | ORAL | Status: DC
  Administered 2023-09-05 – 2023-09-06 (×4): 300 mg via ORAL
  Filled 2023-09-05 (×4): qty 1

## 2023-09-05 MED ORDER — FLUTICASONE PROPIONATE 50 MCG/ACT NA SUSP: 1.0000 | Freq: Every day | NASAL | Status: DC | PRN

## 2023-09-05 MED ORDER — ACETAMINOPHEN 325 MG PO TABS
650.0000 mg | ORAL_TABLET | ORAL | Status: DC | PRN
  Administered 2023-09-05: 650 mg via ORAL
  Filled 2023-09-05: qty 2

## 2023-09-05 MED ORDER — CITALOPRAM HYDROBROMIDE 10 MG PO TABS
10.0000 mg | ORAL_TABLET | Freq: Every day | ORAL | Status: DC
  Administered 2023-09-05 – 2023-09-06 (×2): 10 mg via ORAL
  Filled 2023-09-05 (×2): qty 1

## 2023-09-05 MED ORDER — ACETAMINOPHEN 650 MG RE SUPP: 650.0000 mg | RECTAL | Status: DC | PRN

## 2023-09-05 MED ORDER — MUSCLE RUB 10-15 % EX CREA: 1.0000 | TOPICAL_CREAM | CUTANEOUS | Status: DC | PRN

## 2023-09-05 MED ORDER — HYDRALAZINE HCL 20 MG/ML IJ SOLN: 10.0000 mg | INTRAMUSCULAR | Status: DC | PRN

## 2023-09-05 MED ORDER — SENNOSIDES-DOCUSATE SODIUM 8.6-50 MG PO TABS: 1.0000 | ORAL_TABLET | Freq: Every evening | ORAL | Status: DC | PRN

## 2023-09-05 MED ORDER — ACETAMINOPHEN 160 MG/5ML PO SOLN: 650.0000 mg | ORAL | Status: DC | PRN

## 2023-09-05 MED ORDER — NICOTINE 14 MG/24HR TD PT24
14.0000 mg | MEDICATED_PATCH | Freq: Every day | TRANSDERMAL | Status: DC
  Administered 2023-09-06: 14 mg via TRANSDERMAL
  Filled 2023-09-05 (×2): qty 1

## 2023-09-05 MED ORDER — LORATADINE 10 MG PO TABS
10.0000 mg | ORAL_TABLET | Freq: Every day | ORAL | Status: DC
  Administered 2023-09-05 – 2023-09-06 (×2): 10 mg via ORAL
  Filled 2023-09-05 (×2): qty 1

## 2023-09-05 NOTE — ED Notes (Signed)
Pt not in room.  Vitals unable to be updated.

## 2023-09-05 NOTE — ED Triage Notes (Addendum)
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 Patient states was normal all day today and arrived at work at 11 pm still feeling normal. Some time between 11 pm and 0130 am patient noticed her L jaw, neck and arm started to exhibit having a weird sensation. Denies a decrease of sensation just states that it feels "weird". Patient reports the feeling did temporarily subside. But in triage patient is still complaining of the funning feeling to the left jaw and arm. VSS per EMS. Patient with hx of brain aneurysm in 2016 that was removed without issue.

## 2023-09-05 NOTE — Progress Notes (Signed)
Physical Therapy Note  Patient is functioning at a high level of independence and no physical therapy is indicated at this time. Scored 54/56 on BERG indicating low fall risk. Educated on BEFAST. Ambulating independently in room without loss of balance during dynamic challenges. PT is signing-off. Please re-order if there is any significant change in status. Thank you for this referral.  Kathlyn Sacramento, PT, DPT [facility]  Rehabilitation Services Physical Therapist Office: [phone] Website: [email or web address]

## 2023-09-05 NOTE — Progress Notes (Signed)
RN said pt. Refused MRI until she speaks with Dr. Molli Barrows to attempt EEG placement.

## 2023-09-05 NOTE — Progress Notes (Signed)
Spoke with MRI. They're sending for patient now. EEG to be placed when patient returns.

## 2023-09-05 NOTE — Hospital Course (Signed)
[age]-year-old female presenting with left-sided arm discomfort which has been lasted for 15 minutes and resolved. - At presentation hemodynamically stable except elevated blood pressure 161/72. -CBC unremarkable.  CMP pending - CT head no acute intracranial abnormality - ED physician discussed case with neurology.  Neurology has been evaluated patient recommended to admit to medical care for TIA.  Did not recommended any aspirin or Plavix.  They will see patient soon.

## 2023-09-05 NOTE — Progress Notes (Signed)
Patient is more cooperative now. Going to MRI first. Rn will message when patient returns.

## 2023-09-05 NOTE — H&P (Signed)
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 female with medical history significant of hypertension, diabetes, brain aneurysm s/p clipping, and tobacco abuse who presented with stroke like symptoms.  Patient reported feeling normal throughout the day yesterday.  She went to work around 11 PM.  She gone outside and came back to sit down sometime between 12 AM-1:30 AM when she noticed a "weird" sensation/discomfort in her left jaw, neck and arm.  Patient reported feeling like she was leaning toward the left and her left arm totally went limp for which she was unable to move it.  Patient reported that she has been dealing with pain in her left hand radiating up her arm due to carpal tunnel, but she had already had it fixed on her right hand.  Denies having any headache, change in vision, change in speech, chest pain, palpitations, nausea, vomiting, diarrhea, dysuria, or recent sick contacts to her knowledge.  Coworkers nearby checked her vital signs and called EMS.  She reports that on her way to the hospital left arm weakness improved, but she still has normal sensation on her left jaw.  Patient reported prior history of possible seizure back 2016 following her aneurysm clipping, but workup was negative.  She was placed on think Keppra for some period in time, but discontinued.  The medicine she remembers had caused her caused her to have pain.  In the emergency department patient was noted to be afebrile, pulse 57-63, blood pressures elevated up to 161/72, and all other vital signs maintained.  Labs significant for platelets 76.  CT scan of the head did not note any acute abnormality.  Neurology has been, wilted and recommended  admission for further workup.  Review of Systems: As mentioned in the history of present illness. All other systems reviewed and are negative. Past Medical History:  Diagnosis Date   Brain aneurysm    Diabetes mellitus without complication (HCC)    Hypertension    Stroke [facility])    stroke like symptons/admitted 8/21   Past Surgical History:  Procedure Laterality Date   CHOLECYSTECTOMY     CRANIOTOMY Right 02/04/2015   Procedure: RIGHT CRANIOTOMY FOR ANEURYSM ;  Surgeon: Lisbeth Renshaw, MD;  Location: [facility]UR[facility];  Service: Neurosurgery;  Laterality: Right;   IR GENERIC HISTORICAL  02/04/2015   IR ANGIO INTRA EXTRACRAN SEL COM CAROTID INNOMINATE BILAT MOD SED 02/04/2015 Lisbeth Renshaw, MD [facility]-INTER[facility]   IR GENERIC HISTORICAL  02/04/2015   IR ANGIO VERTEBRAL SEL SUBCLAVIAN INNOMINATE UNI R MOD SED 02/04/2015 Lisbeth Renshaw, MD [facility]   IR GENERIC HISTORICAL  02/04/2015   IR ANGIO VERTEBRAL SEL VERTEBRAL UNI L MOD SED 02/04/2015 Lisbeth Renshaw, MD [facility]   TOTAL ABDOMINAL HYSTERECTOMY     Social History:  reports that she has been smoking cigarettes. She has never used smokeless tobacco. She reports current alcohol use. She reports that she does not use drugs.  Allergies  Allergen Reactions   Demerol Other (See Comments)    "I almost died"   Morphine And Codeine Other (See Comments)    "Almost Died"    Family History  Problem Relation Age of Onset   Diabetes Mother    Heart failure Mother  Cancer Father     Prior to Admission medications   Medication Sig Start Date End Date Taking? Authorizing Provider  albuterol (VENTOLIN HFA) 108 (90 Base) MCG/ACT inhaler Inhale 1-2 puffs into the lungs every 6 (six) hours as needed for wheezing or shortness of breath. 11/22/22  Yes Raspet, Erin K, PA-C  amLODipine (NORVASC) 10 MG tablet TAKE 1 TABLET DAILY TO LOWER BLOOD PRESSURE 03/05/23  Yes Marcine Matar, MD  cetirizine (ZYRTEC) 10 MG tablet Take 1 tablet  (10 mg total) by mouth daily. 03/08/23  Yes Georgian Co M, PA-C  citalopram (CELEXA) 10 MG tablet Take 1 tablet (10 mg total) by mouth daily. 03/05/23  Yes Marcine Matar, MD  fluticasone (FLONASE) 50 MCG/ACT nasal spray Place 1 spray into both nostrils daily. Patient taking differently: Place 1 spray into both nostrils daily as needed for allergies. 11/22/22  Yes Raspet, Erin K, PA-C  gabapentin (NEURONTIN) 300 MG capsule Take 1 capsule (300 mg total) by mouth 3 (three) times daily. 01/06/21  Yes Georgian Co M, PA-C  trolamine salicylate (ASPERCREME) 10 % cream Apply 1 application topically as needed for muscle pain (or sciatica).    Yes [provider]  Blood Glucose Monitoring Suppl (ONETOUCH VERIO) w/Device KIT Use to test blood sugar once daily. E11.9 08/06/20   Fulp, Cammie, MD  ciprofloxacin (CIPRO) 500 MG tablet Take 1 tablet (500 mg total) by mouth every 12 (twelve) hours. Patient not taking: Reported on 09/05/2023 08/28/23   Tomi Bamberger, PA-C  glucose blood [facility] VERIO) test strip Use to check blood sugar once daily. E11.9 08/06/20   Fulp, Hewitt Shorts, MD  Lancet Devices [facility] DELICA PLUS LANCING) MISC Use to check blood sugar once daily. E11.9 08/06/20   Fulp, Hewitt Shorts, MD  OneTouch Delica Lancets 33G MISC Test blood sugars twice daily. E11.9 08/03/20   Cain Saupe, MD    Physical Exam: Vitals:   09/05/23 0425 09/05/23 0430 09/05/23 0445 09/05/23 0603  BP: (!) 147/53 139/60 (!) 150/60   Pulse: 62 62 63   Resp: 16 19 19    Temp:    98.4 F (36.9 C)  TempSrc:    Oral  SpO2: 100% 100% 99%   Weight:      Height:        Constitutional: elderly female in NAD, calm, comfortable Eyes: PERRL, lids and conjunctivae normal ENMT: Mucous membranes are moist. Posterior pharynx clear of any exudate or lesions.  Neck: normal, supple,   Respiratory: clear to auscultation bilaterally, no wheezing, no crackles. Normal respiratory effort. No accessory muscle use.   Cardiovascular: Regular rate and rhythm, no murmurs / rubs / gallops. No extremity edema. 2+ pedal pulses.   Abdomen: no tenderness, no masses palpated.  Bowel sounds positive.  Musculoskeletal: no clubbing / cyanosis. No joint deformity upper and lower extremities. Good ROM, no contractures. Normal muscle tone.  Skin: no rashes, lesions, ulcers. No induration Neurologic: CN 2-12 grossly intact. Sensation abnormal still reported to the left side of face and jaw, DTR normal. Strength 5/5 in all 4.  Psychiatric: Normal judgment and insight. Alert and oriented x 3. Normal mood.    Data Reviewed:  EKG revealed normal sinus rhythm at 66 bpm.  Reviewed labs, imaging, and pertinent records as noted above in HPI.  Assessment and Plan:  TIA  Acute.  Patient present with complaints of abnormal sensation of the left side of her face and arm with weakness.  CT head did not note any acute abnormality.  CT angiogram of the head and neck large vessel occlusion and prior clipping of the right MCA bifurcation.  Symptoms have improved since patient has been here at the hospital.  Question TIA/stroke versus possible seizure.  Patient with reported history of seizure following aneurysm clipping several years ago temporarily on Keppra. -Admit to a telemetry bed -TIA/stroke order set utilized -Neurochecks -Check hemoglobin A1c, lipid panel, TSH -Check MRI of the brain and neck -Check EEG -Check echocardiogram -Appreciate neurology consultative services, will follow-up for any further recommendations  Essential hypertension Blood pressures elevated up to 162/73.  Home blood pressure medication regimen includes amlodipine 10 mg daily. -Allow for permissive hypertension keeping blood pressures less than 220/110  Thrombocytopenia Acute.  Platelet count 76.  Unclear the cause of symptoms at this time. -Recheck CBC in a.m.  History of brain aneurysm Patient with prior right MCA clipping by Dr. Conchita Paris in  2016.  CT angiogram of the head did not note any signs of recurrence of prior aneurysm.  Controlled diabetes mellitus type 2 with neuropathy, without long-term use of insulin Patient does not appear to be on any medications for treatment at baseline. -Follow-up hemoglobin A1c  Tobacco abuse Patient reports smoking cigarettes 1/5 pack of cigarettes per day on average intermittently for over 40 years. -Continue to counsel need of cessation of tobacco use -Nicotine patch  Obesity BMI 31.17 kg/m  DVT prophylaxis: SCDs secondary to thrombocytopenia Advance Care Planning:   Code Status: Full Code    Consults: Neurology  Family Communication:Candace -Daughter updated over the phone Severity of Illness: The appropriate patient status for this patient is OBSERVATION. Observation status is judged to be reasonable and necessary in order to provide the required intensity of service to ensure the patient's safety. The patient's presenting symptoms, physical exam findings, and initial radiographic and laboratory data in the context of their medical condition is felt to place them at decreased risk for further clinical deterioration. Furthermore, it is anticipated that the patient will be medically stable for discharge from the hospital within 2 midnights of admission.   Author: Clydie Braun, MD 09/05/2023 7:23 AM  For on call review [email or web address].

## 2023-09-05 NOTE — Progress Notes (Addendum)
STROKE TEAM PROGRESS NOTE   SUBJECTIVE (INTERVAL HISTORY) No family is at the bedside.  Overall her condition is rapidly improving. She still feel left face some funny feeling but no deficit on touch. She has similar episodes since 2021, sometimes very mild and other times prolonged and more severe like the one yesterday. She has been following with Dr. Pearlean Brownie 3 years ago and recommend MRI brain and C-spine but was not done for unknown reason. She was found to have b/l CTS and had surgery on the right and plan to have surgery on the left. Howeve, her episodes are totally different from her CTS symptoms.   Of note, she had right MCA aneurysm s/p clipping with Dr. Conchita Paris in 2016 with encephalomalacia on the right frontal and temporal lobes.    OBJECTIVE Temp:  [97.9 F (36.6 C)-98.4 F (36.9 C)] 98.1 F (36.7 C) (09/18 1518) Pulse Rate:  [57-73] 65 (09/18 1515) Resp:  [12-20] 18 (09/18 1245) BP: (126-161)/(53-123) 147/58 (09/18 1515) SpO2:  [92 %-100 %] 94 % (09/18 1515) Weight:  [70 kg] 70 kg (09/18 0125)  No results for input(s): "GLUCAP" in the last 168 hours. Recent Labs  Lab 09/05/23 0445  NA 141  K 4.2  CL 105  CO2 25  GLUCOSE 98  BUN 9  CREATININE 0.64  CALCIUM 8.9  MG 2.0   Recent Labs  Lab 09/05/23 0445  AST 27  ALT 19  ALKPHOS 102  BILITOT 0.6  PROT 6.8  ALBUMIN 3.5   Recent Labs  Lab 09/05/23 0156  WBC 6.7  NEUTROABS 2.3  HGB 13.3  HCT 41.2  MCV 91.4  PLT 76*   No results for input(s): "CKTOTAL", "CKMB", "CKMBINDEX", "TROPONINI" in the last 168 hours. Recent Labs    09/05/23 0156  LABPROT 12.5  INR 0.9   Recent Labs    09/05/23 0430  COLORURINE YELLOW  LABSPEC <1.005*  PHURINE 6.0  GLUCOSEU NEGATIVE  HGBUR NEGATIVE  BILIRUBINUR NEGATIVE  KETONESUR NEGATIVE  PROTEINUR NEGATIVE  NITRITE NEGATIVE  LEUKOCYTESUR TRACE*       Component Value Date/Time   CHOL 139 09/05/2023 0445   CHOL 142 03/05/2023 1448   TRIG 67 09/05/2023 0445    HDL 41 09/05/2023 0445   HDL 41 03/05/2023 1448   CHOLHDL 3.4 09/05/2023 0445   VLDL 13 09/05/2023 0445   LDLCALC 85 09/05/2023 0445   LDLCALC 77 03/05/2023 1448   Lab Results  Component Value Date   HGBA1C 5.9 03/05/2023      Component Value Date/Time   LABOPIA NONE DETECTED 09/05/2023 0430   COCAINSCRNUR NONE DETECTED 09/05/2023 0430   LABBENZ NONE DETECTED 09/05/2023 0430   AMPHETMU NONE DETECTED 09/05/2023 0430   THCU NONE DETECTED 09/05/2023 0430   LABBARB NONE DETECTED 09/05/2023 0430    Recent Labs  Lab 09/05/23 0156  ETH <10    I have personally reviewed the radiological images below and agree with the radiology interpretations.  DG CHEST PORT 1 VIEW  Result Date: 09/05/2023 CLINICAL DATA:  16109 TIA (transient ischemic attack) 60454 EXAM: PORTABLE CHEST 1 VIEW COMPARISON:  None Available. FINDINGS: No pleural effusion. No pneumothorax. Normal cardiac and mediastinal contours. Bibasilar atelectasis. No radiographically apparent displaced rib fractures. IMPRESSION: Bibasilar atelectasis. Electronically Signed   By: Lorenza Cambridge M.D.   On: 09/05/2023 08:42   CT ANGIO HEAD NECK W WO CM  Result Date: 09/05/2023 CLINICAL DATA:  Abnormal sensation and jaw and neck EXAM: CT ANGIOGRAPHY HEAD AND NECK WITH  AND WITHOUT CONTRAST TECHNIQUE: Multidetector CT imaging of the head and neck was performed using the standard protocol during bolus administration of intravenous contrast. Multiplanar CT image reconstructions and MIPs were obtained to evaluate the vascular anatomy. Carotid stenosis measurements (when applicable) are obtained utilizing NASCET criteria, using the distal internal carotid diameter as the denominator. RADIATION DOSE REDUCTION: This exam was performed according to the departmental dose-optimization program which includes automated exposure control, adjustment of the mA and/or kV according to patient size and/or use of iterative reconstruction technique. CONTRAST:  75mL  OMNIPAQUE IOHEXOL 350 MG/ML SOLN COMPARISON:  CTA head/neck 07/22/2020 FINDINGS: CTA NECK FINDINGS Aortic arch: There is mild calcified plaque in the imaged aortic arch. The origins of the major branch vessels are patent the subclavian arteries are patent to the level imaged. Right carotid system: The right common, internal, and external carotid arteries are patent, without hemodynamically significant stenosis or occlusion. There is unchanged tortuosity and mild irregularity of the internal carotid artery. There is no evidence of dissection or aneurysm. Left carotid system: The left common, internal, and external carotid arteries are patent, without hemodynamically significant stenosis or occlusion. There is unchanged tortuosity and mild irregularity of the internal carotid artery there is no evidence of dissection or aneurysm. Vertebral arteries: The vertebral arteries are patent, without hemodynamically significant stenosis or occlusion. There is no evidence of dissection or aneurysm. Skeleton: There is no acute osseous abnormality or suspicious osseous lesion there is no visible canal hematoma. Other neck: The soft tissues of the neck are unremarkable. Upper chest: There is emphysema in the lung apices. Review of the MIP images confirms the above findings CTA HEAD FINDINGS Anterior circulation: The intracranial ICAs are patent, without significant stenosis or occlusion. 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 Streak artifact from the clip degrades evaluation of the surrounding vasculature. Within this confine, the right MCA branches appear patent, without proximal stenosis or occlusion. The left MCA is patent, without proximal stenosis or occlusion. The bilateral ACAS are patent, without proximal stenosis or occlusion. There is a common A2 trunk, a normal variant. There is no definite recurrent aneurysm of the right MCA bifurcation. There is no new aneurysm or AVM. Posterior  circulation: The bilateral V4 segments are patent. The basilar artery is patent. The major cerebellar arteries appear patent. The bilateral PCAs are patent, without proximal stenosis or occlusion. Diminutive posterior communicating arteries are identified. There is no aneurysm or AVM. Venous sinuses: Patent. Anatomic variants: As above. Review of the MIP images confirms the above findings IMPRESSION: 1. Patent vasculature of the head and neck with no hemodynamically significant stenosis or occlusion. 2. Status post clipping of the right MCA bifurcation aneurysm without definite evidence of recurrent aneurysm. 3. Emphysema. Electronically Signed   By: Lesia Hausen M.D.   On: 09/05/2023 08:34   CT HEAD WO CONTRAST  Result Date: 09/05/2023 CLINICAL DATA:  Weird sensation in left jaw and arm EXAM: CT HEAD WITHOUT CONTRAST TECHNIQUE: Contiguous axial images were obtained from the base of the skull through the vertex without intravenous contrast. RADIATION DOSE REDUCTION: This exam was performed according to the departmental dose-optimization program which includes automated exposure control, adjustment of the mA and/or kV according to patient size and/or use of iterative reconstruction technique. COMPARISON:  12/06/2020 FINDINGS: Brain: No evidence of acute infarction, hemorrhage, mass, mass effect, or midline shift. No hydrocephalus or extra-axial fluid collection. Redemonstrated chronic encephalomalacia in the right frontal and temporal lobe Vascular: No hyperdense  vessel.  Right MCA territory aneurysm clip. Skull: Negative for fracture or focal lesion. Prior right frontal craniotomy. Sinuses/Orbits: No acute finding. Other: The mastoid air cells are well aerated. IMPRESSION: No acute intracranial process. Electronically Signed   By: Wiliam Ke M.D.   On: 09/05/2023 02:45     PHYSICAL EXAM  Temp:  [97.9 F (36.6 C)-98.4 F (36.9 C)] 98.1 F (36.7 C) (09/18 1518) Pulse Rate:  [57-73] 65 (09/18  1515) Resp:  [12-20] 18 (09/18 1245) BP: (126-161)/(53-123) 147/58 (09/18 1515) SpO2:  [92 %-100 %] 94 % (09/18 1515) Weight:  [70 kg] 70 kg (09/18 0125)  General - Well nourished, well developed, in no apparent distress.  Ophthalmologic - fundi not visualized due to noncooperation.  Cardiovascular - Regular rhythm and rate.  Mental Status -  Level of arousal and orientation to time, place, and person were intact. Language including expression, naming, repetition, comprehension was assessed and found intact. Attention span and concentration were normal. Recent and remote memory were intact. Fund of Knowledge was assessed and was intact.  Cranial Nerves II - XII - II - Visual field intact OU. III, IV, VI - Extraocular movements intact. V - Facial sensation intact bilaterally. Subjective mild funny feeling on the left face VII - Facial movement intact bilaterally. VIII - Hearing & vestibular intact bilaterally. X - Palate elevates symmetrically. XI - Chin turning & shoulder shrug intact bilaterally. XII - Tongue protrusion intact.  Motor Strength - The patient's strength was normal in all extremities and pronator drift was absent.  Bulk was normal and fasciculations were absent.   Motor Tone - Muscle tone was assessed at the neck and appendages and was normal.  Reflexes - The patient's reflexes were symmetrical in all extremities and she had no pathological reflexes.  Sensory - Light touch, temperature/pinprick were assessed and were symmetrical.    Coordination - The patient had normal movements in the hands and feet with no ataxia or dysmetria.  Tremor was absent.  Gait and Station - deferred.   ASSESSMENT/PLAN Ms. CHAUNTEL GORSUCH is a 67 y.o. female with history of HTN, DM, R MCA aneurysm s/p clipping in 2016, smoker admitted for recurrent episodes of left face, arm numbness and arm weakness. No tPA given due to symptoms rapid improving.    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  ED presentation in 07/2020 with stereotyped episodes of left jaw pain and left arm pain associated with weakness. CT and CTA negative. Per Dr. Iver Nestle "The description of her events is concerning for possible focal seizures given the premonition she feels at first, the evolution over time and the gradual resolution subsequent to the initial event. However the lack of rhythmic shaking is somewhat atypical."  She was recommended for outpt EEG at that time. She followed with Dr. Pearlean Brownie in 09/2020 and recommend MRI brain and C-spine but was not done for unknown reason. 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 Howeve, her episodes are totally different from her CTS symptoms.  CT no acute finding but chronic encephalomalacia in the right frontal and temporal lobe CTA head and neck Status post clipping  of the right MCA bifurcation aneurysm without definite evidence of recurrent aneurysm MRI brain pending MRI C-spine pending 2D Echo  pending EEG pending LDL 85 HgbA1c pending UDS neg SCDs for VTE prophylaxis No antithrombotic prior to admission, now on No antithrombotic.  Start keppra 500 bid Ongoing aggressive stroke risk factor management Therapy recommendations:  pending Disposition:  pending, follow up with Dr. Pearlean Brownie at [facility]  Diabetes HgbA1c pending goal < 7.0 CBG monitoring SSI DM education and close PCP follow up  Hypertension Stable BP stable Long term BP goal normotensive  Lipid management Home meds:  none  LDL 85, goal < 100 No statin needed at LDL at goal  Tobacco abuse Current smoker Smoking cessation counseling  provided Nicotine patch provided Pt is willing to quit  Other Stroke Risk Factors Advanced age Obesity, Body mass index is 31.17 kg/m.   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 Wende Crease day # 0  I spent additional 30 min inpatient face-to-face time with the patient, more than 50% of which was spent in counseling and coordination of care, reviewing test results, images and medication, and discussing the diagnosis, treatment plan and potential prognosis. This patient's care requiresreview of multiple databases, neurological assessment, discussion with family, other specialists and medical decision making of high complexity.  Marvel Plan, MD PhD Stroke Neurology 09/05/2023 3:50 PM    To contact Stroke Continuity provider, please refer to [email or web address]. After hours, contact General Neurology

## 2023-09-05 NOTE — Consult Note (Signed)
NEURO HOSPITALIST CONSULT NOTE   Requestig physician: Dr. Bebe Shaggy  Reason for Consult: Left face and neck pain with numbness radiating down left arm  History obtained from:   Patient and Chart     HPI:                                                                                                                                          Renee Murray is an 67 y.o. female with a PMHx of right sided brain aneurysm s/p craniotomy, DM, HTN and prior TIA-like symptoms who presented to the ED early this AM after experiencing TIA-like symptoms at work.   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 Patient states was normal all day today and arrived at work at 11 pm still feeling normal. Some time between 11 pm and 0130 am patient noticed her L jaw, neck and arm started to exhibit having a weird sensation. Denies a decrease of sensation just states that it feels "weird". Patient reports the feeling did temporarily subside. But in triage patient is still complaining of the funning feeling to the left jaw and arm. VSS per EMS. Patient with hx of brain aneurysm in 2016 that was removed without issue."  The patient states to Neurologist that she has had approximately 10 episodes of the above symptoms over about the past 2 years. The symptoms have all been essentially identical and have all spontaneously resolved without residual deficit. She states that currently, she still feels some numbness to her left hand, arm and jaw that she describes as similar to when a limb "falls asleep". She denies any aphasia, facial droop or limb weakness. Does not endorse any difficulty walking or ataxia. No CP or SOB. No headache.   Past Medical History:  Diagnosis Date   Brain aneurysm    Diabetes mellitus without complication (HCC)    Hypertension    Stroke [place])    stroke like symptons/admitted 8/21    Past Surgical History:  Procedure  Laterality Date   CHOLECYSTECTOMY     CRANIOTOMY Right 02/04/2015   Procedure: RIGHT CRANIOTOMY FOR ANEURYSM ;  Surgeon: Lisbeth Renshaw, MD;  Location: [facility];  Service: Neurosurgery;  Laterality: Right;   IR GENERIC HISTORICAL  02/04/2015   IR ANGIO INTRA EXTRACRAN SEL COM CAROTID INNOMINATE BILAT MOD SED 02/04/2015 Lisbeth Renshaw, MD [facility]   IR GENERIC HISTORICAL  02/04/2015   IR ANGIO VERTEBRAL SEL SUBCLAVIAN INNOMINATE UNI R MOD SED 02/04/2015 Lisbeth Renshaw, MD [facility]   IR GENERIC HISTORICAL  02/04/2015   IR ANGIO VERTEBRAL SEL VERTEBRAL UNI L MOD SED 02/04/2015 Lisbeth Renshaw, MD [facility]   TOTAL ABDOMINAL HYSTERECTOMY      Family History  Problem  Relation Age of Onset   Diabetes Mother    Heart failure Mother    Cancer Father               Social History:  reports that she has been smoking cigarettes. She has never used smokeless tobacco. She reports current alcohol use. She reports that she does not use drugs.  Allergies  Allergen Reactions   Demerol Other (See Comments)    "I almost died"   Morphine And Codeine Other (See Comments)    "Almost Died"    MEDICATIONS:                                                                                                                     No current facility-administered medications on file prior to encounter.   Current Outpatient Medications on File Prior to Encounter  Medication Sig Dispense Refill   albuterol (VENTOLIN HFA) 108 (90 Base) MCG/ACT inhaler Inhale 1-2 puffs into the lungs every 6 (six) hours as needed for wheezing or shortness of breath. 18 g 0   amLODipine (NORVASC) 10 MG tablet TAKE 1 TABLET DAILY TO LOWER BLOOD PRESSURE 90 tablet 1   cetirizine (ZYRTEC) 10 MG tablet Take 1 tablet (10 mg total) by mouth daily. 90 tablet 1   citalopram (CELEXA) 10 MG tablet Take 1 tablet (10 mg total) by mouth daily. 90 tablet 1   fluticasone (FLONASE) 50 MCG/ACT nasal spray Place 1 spray into  both nostrils daily. (Patient taking differently: Place 1 spray into both nostrils daily as needed for allergies.) 16 g 0   gabapentin (NEURONTIN) 300 MG capsule Take 1 capsule (300 mg total) by mouth 3 (three) times daily. 270 capsule 1   trolamine salicylate (ASPERCREME) 10 % cream Apply 1 application topically as needed for muscle pain (or sciatica).      Blood Glucose Monitoring Suppl (ONETOUCH VERIO) w/Device KIT Use to test blood sugar once daily. E11.9 1 kit 0   glucose blood (ONETOUCH VERIO) test strip Use to check blood sugar once daily. E11.9 100 each 6   Lancet Devices (ONETOUCH DELICA PLUS LANCING) MISC Use to check blood sugar once daily. E11.9 1 each 6   OneTouch Delica Lancets 33G MISC Test blood sugars twice daily. E11.9 100 each 11    Scheduled:  [START ON 09/06/2023]  stroke: early stages of recovery book   Does not apply Once   citalopram  10 mg Oral Daily   gabapentin  300 mg Oral TID   loratadine  10 mg Oral Daily   nicotine  14 mg Transdermal Daily   Continuous:  sodium chloride      ROS:  As per HPI. Does not endorse any additional symptoms at time of neurology evaluation.    Blood pressure (!) 161/72, pulse (!) 57, temperature 97.9 F (36.6 C), resp. rate 16, height 4\' 11"  (1.499 m), weight 70 kg, SpO2 99%.   General Examination:                                                                                                       Physical Exam  HEENT-  [place]/AT    Lungs- Respirations unlabored Extremities- No edema  Neurological Examination Mental Status: Alert, oriented x 5, thought content appropriate.  Speech fluent without evidence of aphasia. No dysarthria. Able to follow all commands without difficulty. Cranial Nerves: II: Temporal visual fields intact with no extinction to DSS. PERRL  III,IV, VI: No ptosis. EOMI.  V:  Temp sensation decreased on the left  VII: Smile symmetric VIII: Hearing intact to voice IX,X: No hoarseness XI: Symmetric shoulder shrug XII: Midline tongue extension Motor: BUE 5/5 proximally and distally BLE 5/5 proximally and distally  No pronator drift.  Sensory: Light touch intact throughout, bilaterally. Temp sensation decreased to LUE. No extinction to DSS.  Deep Tendon Reflexes: Hypoactive reflexes throughout Cerebellar: No ataxia with FNF bilaterally  Gait: Deferred    Lab Results: Basic Metabolic Panel: No results for input(s): "NA", "K", "CL", "CO2", "GLUCOSE", "BUN", "CREATININE", "CALCIUM", "MG", "PHOS" in the last 168 hours.  CBC: Recent Labs  Lab 09/05/23 0156  WBC 6.7  NEUTROABS 2.3  HGB 13.3  HCT 41.2  MCV 91.4  PLT 76*    Cardiac Enzymes: No results for input(s): "CKTOTAL", "CKMB", "CKMBINDEX", "TROPONINI" in the last 168 hours.  Lipid Panel: No results for input(s): "CHOL", "TRIG", "HDL", "CHOLHDL", "VLDL", "LDLCALC" in the last 168 hours.  Imaging: CT HEAD WO CONTRAST  Result Date: 09/05/2023 CLINICAL DATA:  Weird sensation in left jaw and arm EXAM: CT HEAD WITHOUT CONTRAST TECHNIQUE: Contiguous axial images were obtained from the base of the skull through the vertex without intravenous contrast. RADIATION DOSE REDUCTION: This exam was performed according to the departmental dose-optimization program which includes automated exposure control, adjustment of the mA and/or kV according to patient size and/or use of iterative reconstruction technique. COMPARISON:  12/06/2020 FINDINGS: Brain: No evidence of acute infarction, hemorrhage, mass, mass effect, or midline shift. No hydrocephalus or extra-axial fluid collection. Redemonstrated chronic encephalomalacia in the right frontal and temporal lobe Vascular: No hyperdense vessel.  Right MCA territory aneurysm clip. Skull: Negative for fracture or focal lesion. Prior right frontal craniotomy. Sinuses/Orbits:  No acute finding. Other: The mastoid air cells are well aerated. IMPRESSION: No acute intracranial process. Electronically Signed   By: Wiliam Ke M.D.   On: 09/05/2023 02:45     Assessment: 67 year old female with a history of right MCA aneurysm clipping who presents with left face and neck pain/numbness with numbness radiating down left arm. She has a history of multiple stereotyped episodes of the same over the past 2 years.  - Exam reveals left facial and LUE numbness. Otherwise negative.  - CT head:  No acute intracranial process. Right MCA aneurysm clip. Redemonstrated chronic encephalomalacia in the right frontal and temporal lobe  - Sensory symptoms started in face/neck and then radiated down left arm.  - DDx includes recurrent TIA secondary to focal right MCA stenosis, versus an epileptic phenomenon. Muscle spasm is also on the DDx.    Recommendations: - EEG (ordered) - HgbA1c, fasting lipid panel - MRI of the brain without contrast - CTA of head and neck - PT consult, OT consult, Speech consult - Echocardiogram - Statin - ASA 81 mg po qd  - Risk factor modification - Telemetry monitoring - Frequent neuro checks - NPO until passes stroke swallow screen   Electronically signed: Dr. Caryl Pina 09/05/2023, 3:26 AM

## 2023-09-05 NOTE — ED Provider Triage Note (Signed)
Emergency Medicine Provider Triage Evaluation Note  Renee Murray , a 67 y.o. female  was evaluated in triage.  Pt complains of left arm weakness. She states that same began when she was at work tonight around [employer]. She notes that her left arm was suddenly weak and she had a 'strange sensation' in her left face and neck area. She states these symptoms lasted about 20 minutes and then began to improve. She now denies any weakness. She notes she still has a strange sensation to the left side of her face but denies any numbness. She states that it feels like 'tension in my jaw.' Of note, patient was seen for this back in 2021, code stroke was activated and patient was evaluated, no stroke was found. Patient was supposed to have EMG but never did. She states that these symptoms are exactly the same as when she was seen previously. Denies headaches, vision changes, weakness, numbness.   Review of Systems  Positive:  Negative:   Physical Exam  BP (!) 161/72 (BP Location: Right Arm)   Pulse (!) 57   Temp 97.9 F (36.6 C)   Resp 16   Ht 4\' 11"  (1.499 m)   Wt 70 kg   SpO2 99%   BMI 31.17 kg/m  Gen:   Awake, no distress   Resp:  Normal effort  MSK:   Moves extremities without difficulty  Other:  No focal neurologic deficits. No sensation changes noted to the face or neck. 5/5 strength and sensation intact bilaterally  Medical Decision Making  Medically screening exam initiated at 1:46 AM.  Appropriate orders placed.  DANNYELLE GROVES was informed that the remainder of the evaluation will be completed by another provider, this initial triage assessment does not replace that evaluation, and the importance of remaining in the ED until their evaluation is complete.  Work-up initiated. No neurologic deficits and therefore no code stroke activation. Will likely need TIA workup    Silva Bandy, PA-C 09/05/23 0157

## 2023-09-05 NOTE — ED Provider Notes (Signed)
[place] EMERGENCY DEPARTMENT AT [facility] Provider Note   CSN: [id number] Arrival date & time: 09/05/23  0116     History  Chief Complaint - weakness  Renee Murray is a 67 y.o. female.  The history is provided by the patient and a relative.   Patient with history of hypertension, diabetes, previous MCA aneurysm that required clipping presents with left-sided arm weakness.  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  She reports she started having a strange feeling in her left jaw and neck and then began having numbness and weakness in her left arm This lasted at least 15 minutes.  She had no leg weakness.  No obvious facial weakness but did feel that her speech was different.  She reports all of her weakness is improved, but still has a strange sensation in her left jaw and neck.  No chest pain or shortness of breath.  No headache or visual changes.  She was at her baseline prior to going to work.  She reports she has had these episodes intermittently over the past several years.  She reports they typically do testing and discharge her home    Home Medications Prior to Admission medications   Medication Sig Start Date End Date Taking? Authorizing Provider  albuterol (VENTOLIN HFA) 108 (90 Base) MCG/ACT inhaler Inhale 1-2 puffs into the lungs every 6 (six) hours as needed for wheezing or shortness of breath. 11/22/22  Yes Raspet, Erin K, PA-C  amLODipine (NORVASC) 10 MG tablet TAKE 1 TABLET DAILY TO LOWER BLOOD PRESSURE 03/05/23  Yes Marcine Matar, MD  cetirizine (ZYRTEC) 10 MG tablet Take 1 tablet (10 mg total) by mouth daily. 03/08/23  Yes Georgian Co M, PA-C  citalopram (CELEXA) 10 MG tablet Take 1 tablet (10 mg total) by mouth daily. 03/05/23  Yes Marcine Matar, MD  fluticasone (FLONASE) 50 MCG/ACT nasal spray Place 1 spray into both nostrils daily. Patient taking differently: Place 1 spray into both nostrils daily as needed  for allergies. 11/22/22  Yes Raspet, Erin K, PA-C  gabapentin (NEURONTIN) 300 MG capsule Take 1 capsule (300 mg total) by mouth 3 (three) times daily. 01/06/21  Yes Georgian Co M, PA-C  trolamine salicylate (ASPERCREME) 10 % cream Apply 1 application topically as needed for muscle pain (or sciatica).    Yes [provider]  Blood Glucose Monitoring Suppl (ONETOUCH VERIO) w/Device KIT Use to test blood sugar once daily. E11.9 08/06/20   Fulp, Cammie, MD  ciprofloxacin (CIPRO) 500 MG tablet Take 1 tablet (500 mg total) by mouth every 12 (twelve) hours. Patient not taking: Reported on 09/05/2023 08/28/23   Tomi Bamberger, PA-C  glucose blood [facility] VERIO) test strip Use to check blood sugar once daily. E11.9 08/06/20   Fulp, Hewitt Shorts, MD  Lancet Devices [facility] DELICA PLUS LANCING) MISC Use to check blood sugar once daily. E11.9 08/06/20   Fulp, Hewitt Shorts, MD  OneTouch Delica Lancets 33G MISC Test blood sugars twice daily. E11.9 08/03/20   Fulp, Cammie, MD      Allergies    Demerol and Morphine and codeine    Review of Systems   Review of Systems  Constitutional:  Negative for fever.  Eyes:  Negative for visual disturbance.  Respiratory:  Negative for shortness of breath.   Cardiovascular:  Negative for chest pain.  Neurological:  Positive for weakness. Negative for headaches.    Physical Exam Updated Vital Signs BP (!) 147/53  Pulse 62   Temp 97.9 F (36.6 C)   Resp 16   Ht 1.499 m (4\' 11" )   Wt 70 kg   SpO2 100%   BMI 31.17 kg/m  Physical Exam CONSTITUTIONAL: Well developed/well nourished HEAD: Normocephalic/atraumatic EYES: EOMI/PERRL, no nystagmus,  no ptosis ENMT: Mucous membranes moist, no stridor, no drooling no anterior neck edema or tenderness NECK: supple no meningeal signs, no bruits CV: S1/S2 noted, no murmurs/rubs/gallops noted LUNGS: Lungs are clear to auscultation bilaterally, no apparent distress ABDOMEN: soft, nontender, no rebound or guarding GU:no  cva tenderness NEURO:Awake/alert, face symmetric, no arm or leg drift is noted Equal 5/5 strength with shoulder abduction, elbow flex/extension, wrist flex/extension in upper extremities and equal hand grips bilaterally Equal 5/5 strength with hip flexion,knee flex/extension, foot dorsi/plantar flexion Cranial nerves 3/4/5/6/06/25/09/11/12 tested and intact Sensation to light touch intact in all extremities EXTREMITIES: pulses normalx4, full ROM SKIN: warm, color normal PSYCH: no abnormalities of mood noted  ED Results / Procedures / Treatments   Labs (all labs ordered are listed, but only abnormal results are displayed) Labs Reviewed  CBC - Abnormal; Notable for the following components:      Result Value   Platelets 76 (*)    nRBC 0.3 (*)    All other components within normal limits  ETHANOL  PROTIME-INR  APTT  DIFFERENTIAL  RAPID URINE DRUG SCREEN, HOSP PERFORMED  URINALYSIS, ROUTINE W REFLEX MICROSCOPIC  COMPREHENSIVE METABOLIC PANEL    EKG EKG Interpretation Date/Time:  Wednesday September 05 2023 01:51:36 EDT Ventricular Rate:  66 PR Interval:  170 QRS Duration:  72 QT Interval:  408 QTC Calculation: 427 R Axis:   52  Text Interpretation: Normal sinus rhythm Left ventricular hypertrophy with repolarization abnormality ( Sokolow-Lyon ) Abnormal ECG Confirmed by Zadie Rhine ([id number]) on 09/05/2023 1:54:18 AM  Radiology CT HEAD WO CONTRAST  Result Date: 09/05/2023 CLINICAL DATA:  Weird sensation in left jaw and arm EXAM: CT HEAD WITHOUT CONTRAST TECHNIQUE: Contiguous axial images were obtained from the base of the skull through the vertex without intravenous contrast. RADIATION DOSE REDUCTION: This exam was performed according to the departmental dose-optimization program which includes automated exposure control, adjustment of the mA and/or kV according to patient size and/or use of iterative reconstruction technique. COMPARISON:  12/06/2020 FINDINGS: Brain: No evidence  of acute infarction, hemorrhage, mass, mass effect, or midline shift. No hydrocephalus or extra-axial fluid collection. Redemonstrated chronic encephalomalacia in the right frontal and temporal lobe Vascular: No hyperdense vessel.  Right MCA territory aneurysm clip. Skull: Negative for fracture or focal lesion. Prior right frontal craniotomy. Sinuses/Orbits: No acute finding. Other: The mastoid air cells are well aerated. IMPRESSION: No acute intracranial process. Electronically Signed   By: Wiliam Ke M.D.   On: 09/05/2023 02:45    Procedures Procedures    Medications Ordered in ED Medications  nicotine (NICODERM CQ - dosed in mg/24 hours) patch 14 mg (has no administration in time range)    ED Course/ Medical Decision Making/ A&P Clinical Course as of 09/05/23 0500  Wed Sep 05, 2023  6045 Platelets(!): 76 Thrombocytopenia [DW]  0406 Patient presents for unilateral weakness that lasted approximately 15 minutes and now resolved.  She reports previous episodes was not had any recent evaluation.  Strong consideration for TIA as she has an elevated ABCD 2 score.  Case discussed with neurology Dr. Otelia Limes, recommends medical admission and neurology can follow along  Patient agrees to be admitted [DW]  [phone] Discussed with Dr. Janalyn Shy  for admission Pt will be admitted to hospitalist service [DW]    Clinical Course User Index [DW] Zadie Rhine, MD       ABCD2 Score: 6                         Medical Decision Making Amount and/or Complexity of Data Reviewed Labs:  Decision-making details documented in ED Course.  Risk OTC drugs. Decision regarding hospitalization.   This patient presents to the ED for concern of weakness, this involves an extensive number of treatment options, and is a complaint that carries with it a high risk of complications and morbidity.  The differential diagnosis includes but is not limited to CVA, intracranial hemorrhage, acute coronary syndrome, renal  failure, urinary tract infection, electrolyte disturbance, pneumonia   Comorbidities that complicate the patient evaluation: Patient's presentation is complicated by their history of previous aneurysm  Social Determinants of Health: Patient's  tobacco use   increases the complexity of managing their presentation  Additional history obtained: Additional history obtained from family Records reviewed previous admission documents  Lab Tests: I Ordered, and personally interpreted labs.  The pertinent results include: Thrombocytopenia  Imaging Studies ordered: I ordered imaging studies including CT scan head   I independently visualized and interpreted imaging which showed no acute findings I agree with the radiologist interpretation   Critical Interventions:   due to elevated ABCD 2 score and given history, plan to admit for TIA  Consultations Obtained: I requested consultation with the admitting physician [facility] hospitalist and consultant neurology , and discussed  findings as well as pertinent plan - they recommend: Admission for TIA workup  Reevaluation: After the interventions noted above, I reevaluated the patient and found that they have :stayed the same  Complexity of problems addressed: Patient's presentation is most consistent with  acute presentation with potential threat to life or bodily function  Disposition: After consideration of the diagnostic results and the patient's response to treatment,  I feel that the patent would benefit from admission   .           Final Clinical Impression(s) / ED Diagnoses Final diagnoses:  TIA (transient ischemic attack)    Rx / DC Orders ED Discharge Orders     None         Zadie Rhine, MD 09/05/23 0500

## 2023-09-05 NOTE — ED Notes (Signed)
ED TO INPATIENT HANDOFF REPORT  ED Nurse Name and Phone #: Abdelaziz Westenberger [phone]  S Name/Age/Gender Renee Murray 67 y.o. female Room/Bed: 023C/023C  Code Status   Code Status: Full Code  Home/SNF/Other Home Patient oriented to: self, place, time, and situation Is this baseline? 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 Patient states was normal all day today and arrived at work at 11 pm still feeling normal. Some time between 11 pm and 0130 am patient noticed her L jaw, neck and arm started to exhibit having a weird sensation. Denies a decrease of sensation just states that it feels "weird". Patient reports the feeling did temporarily subside. But in triage patient is still complaining of the funning feeling to the left jaw and arm. VSS per EMS. Patient with hx of brain aneurysm in 2016 that was removed without issue.    Allergies Allergies  Allergen Reactions   Demerol Other (See Comments)    "I almost died"   Morphine And Codeine Other (See Comments)    "Almost Died"    Level of Care/Admitting Diagnosis ED Disposition     ED Disposition  Admit   Condition  --   Comment  Hospital Area: [facility] [[id number]]  Level of Care: Telemetry Medical [104]  May place patient in observation at [facility] or [facility] if equivalent level of care is available:: No  Covid Evaluation: Asymptomatic - no recent exposure (last 10 days) testing not required  Diagnosis: TIA (transient ischemic attack) [161096]  Admitting Physician: Clydie Braun [[id number]]  Attending Physician: Clydie Braun [[id number]]          B Medical/Surgery History Past Medical History:  Diagnosis Date   Brain aneurysm    Diabetes mellitus without complication (HCC)    Hypertension    Stroke (HCC)    stroke like symptons/admitted 8/21   Past  Surgical History:  Procedure Laterality Date   CEREBRAL ANEURYSM REPAIR  2016   Yasargil clip x2 Model # [id number]  & [id number]   CHOLECYSTECTOMY     CRANIOTOMY Right 02/04/2015   Procedure: RIGHT CRANIOTOMY FOR ANEURYSM ;  Surgeon: Lisbeth Renshaw, MD;  Location: [facility];  Service: Neurosurgery;  Laterality: Right;   IR GENERIC HISTORICAL  02/04/2015   IR ANGIO INTRA EXTRACRAN SEL COM CAROTID INNOMINATE BILAT MOD SED 02/04/2015 Lisbeth Renshaw, MD [facility]   IR GENERIC HISTORICAL  02/04/2015   IR ANGIO VERTEBRAL SEL SUBCLAVIAN INNOMINATE UNI R MOD SED 02/04/2015 Lisbeth Renshaw, MD [facility]   IR GENERIC HISTORICAL  02/04/2015   IR ANGIO VERTEBRAL SEL VERTEBRAL UNI L MOD SED 02/04/2015 Lisbeth Renshaw, MD [facility]   TOTAL ABDOMINAL HYSTERECTOMY       A IV Location/Drains/Wounds Patient Lines/Drains/Airways Status     Active Line/Drains/Airways     Name Placement date Placement time Site Days   Peripheral IV 09/05/23 18 G 1" Anterior;Left;Proximal Forearm 09/05/23  0159  Forearm  less than 1            Intake/Output Last 24 hours No intake or output data in the 24 hours ending 09/05/23 1401  Labs/Imaging Results for orders placed or performed during the hospital encounter of 09/05/23 (from the past 48 hour(s))  Ethanol     Status: None   Collection Time: 09/05/23  1:56 AM  Result Value Ref Range  Alcohol, Ethyl (B) <10 <10 mg/dL    Comment: (NOTE) Lowest detectable limit for serum alcohol is 10 mg/dL.  For medical purposes only. Performed at [facility], [address], [place], [place] [address]   Protime-INR     Status: None   Collection Time: 09/05/23  1:56 AM  Result Value Ref Range   Prothrombin Time 12.5 11.4 - 15.2 seconds   INR 0.9 0.8 - 1.2    Comment: (NOTE) INR goal varies based on device and disease states. Performed at [facility], [address], [place], [place] [address]   APTT     Status: None   Collection  Time: 09/05/23  1:56 AM  Result Value Ref Range   aPTT 26 24 - 36 seconds    Comment: Performed at [facility], [address], [place], [place] [address]  CBC     Status: Abnormal   Collection Time: 09/05/23  1:56 AM  Result Value Ref Range   WBC 6.7 4.0 - 10.5 K/uL   RBC 4.51 3.87 - 5.11 MIL/uL   Hemoglobin 13.3 12.0 - 15.0 g/dL   HCT 08.6 57.8 - 46.9 %   MCV 91.4 80.0 - 100.0 fL   MCH 29.5 26.0 - 34.0 pg   MCHC 32.3 30.0 - 36.0 g/dL   RDW 62.9 52.8 - 41.3 %   Platelets 76 (L) 150 - 400 K/uL    Comment: Immature Platelet Fraction may be clinically indicated, consider ordering this additional test [id number] REPEATED TO VERIFY    nRBC 0.3 (H) 0.0 - 0.2 %    Comment: Performed at [facility], [address], [place], [place] [address]  Differential     Status: None   Collection Time: 09/05/23  1:56 AM  Result Value Ref Range   Neutrophils Relative % 34 %   Neutro Abs 2.3 1.7 - 7.7 K/uL   Lymphocytes Relative 54 %   Lymphs Abs 3.6 0.7 - 4.0 K/uL   Monocytes Relative 6 %   Monocytes Absolute 0.4 0.1 - 1.0 K/uL   Eosinophils Relative 5 %   Eosinophils Absolute 0.4 0.0 - 0.5 K/uL   Basophils Relative 1 %   Basophils Absolute 0.1 0.0 - 0.1 K/uL   Immature Granulocytes 0 %   Abs Immature Granulocytes 0.02 0.00 - 0.07 K/uL    Comment: Performed at [facility], [address], [place], [place] [address]  Urine rapid drug screen (hosp performed)     Status: None   Collection Time: 09/05/23  4:30 AM  Result Value Ref Range   Opiates NONE DETECTED NONE DETECTED   Cocaine NONE DETECTED NONE DETECTED   Benzodiazepines NONE DETECTED NONE DETECTED   Amphetamines NONE DETECTED NONE DETECTED   Tetrahydrocannabinol NONE DETECTED NONE DETECTED   Barbiturates NONE DETECTED NONE DETECTED    Comment: (NOTE) DRUG SCREEN FOR MEDICAL PURPOSES ONLY.  IF CONFIRMATION IS NEEDED FOR ANY PURPOSE, NOTIFY LAB WITHIN 5 DAYS.  LOWEST DETECTABLE LIMITS FOR URINE DRUG  SCREEN Drug Class                     Cutoff (ng/mL) Amphetamine and metabolites    1000 Barbiturate and metabolites    200 Benzodiazepine                 200 Opiates and metabolites        300 Cocaine and metabolites  300 THC                            50 Performed at [facility], [address] [place], [place] [address]   Urinalysis, Routine w reflex microscopic -Urine, Clean Catch     Status: Abnormal   Collection Time: 09/05/23  4:30 AM  Result Value Ref Range   Color, Urine YELLOW YELLOW   APPearance CLEAR CLEAR   Specific Gravity, Urine <1.005 (L) 1.005 - 1.030   pH 6.0 5.0 - 8.0   Glucose, UA NEGATIVE NEGATIVE mg/dL   Hgb urine dipstick NEGATIVE NEGATIVE   Bilirubin Urine NEGATIVE NEGATIVE   Ketones, ur NEGATIVE NEGATIVE mg/dL   Protein, ur NEGATIVE NEGATIVE mg/dL   Nitrite NEGATIVE NEGATIVE   Leukocytes,Ua TRACE (A) NEGATIVE    Comment: Performed at [facility], [address] [place], [place] [address]  Urinalysis, Microscopic (reflex)     Status: None   Collection Time: 09/05/23  4:30 AM  Result Value Ref Range   RBC / HPF 0-5 0 - 5 RBC/hpf   WBC, UA 0-5 0 - 5 WBC/hpf   Bacteria, UA NONE SEEN NONE SEEN   Squamous Epithelial / HPF 0-5 0 - 5 /HPF   Mucus PRESENT     Comment: Performed at [facility], [address] [place], [place] [address]  Comprehensive metabolic panel     Status: None   Collection Time: 09/05/23  4:45 AM  Result Value Ref Range   Sodium 141 135 - 145 mmol/L   Potassium 4.2 3.5 - 5.1 mmol/L    Comment: HEMOLYSIS AT THIS LEVEL MAY AFFECT RESULT   Chloride 105 98 - 111 mmol/L   CO2 25 22 - 32 mmol/L   Glucose, Bld 98 70 - 99 mg/dL    Comment: Glucose reference range applies only to samples taken after fasting for at least 8 hours.   BUN 9 8 - 23 mg/dL   Creatinine, Ser 2.95 0.44 - 1.00 mg/dL   Calcium 8.9 8.9 - 28.4 mg/dL   Total Protein 6.8 6.5 - 8.1 g/dL   Albumin 3.5 3.5 - 5.0 g/dL   AST 27 15 - 41 U/L     Comment: HEMOLYSIS AT THIS LEVEL MAY AFFECT RESULT   ALT 19 0 - 44 U/L    Comment: HEMOLYSIS AT THIS LEVEL MAY AFFECT RESULT   Alkaline Phosphatase 102 38 - 126 U/L   Total Bilirubin 0.6 0.3 - 1.2 mg/dL    Comment: HEMOLYSIS AT THIS LEVEL MAY AFFECT RESULT   GFR, Estimated >60 >60 mL/min    Comment: (NOTE) Calculated using the CKD-EPI Creatinine Equation (2021)    Anion gap 11 5 - 15    Comment: Performed at [facility], [address], [place], [place] [address]  Lipid panel     Status: None   Collection Time: 09/05/23  4:45 AM  Result Value Ref Range   Cholesterol 139 0 - 200 mg/dL   Triglycerides 67 <010 mg/dL   HDL 41 >27 mg/dL   Total CHOL/HDL Ratio 3.4 RATIO   VLDL 13 0 - 40 mg/dL   LDL Cholesterol 85 0 - 99 mg/dL    Comment:        Total Cholesterol/HDL:CHD Risk Coronary Heart Disease Risk Table                     Men  Women  1/2 Average Risk   3.4   3.3  Average Risk       5.0   4.4  2 X Average Risk   9.6   7.1  3 X Average Risk  23.4   11.0        Use the calculated Patient Ratio above and the CHD Risk Table to determine the patient's CHD Risk.        ATP III CLASSIFICATION (LDL):  <100     mg/dL   Optimal  528-413  mg/dL   Near or Above                    Optimal  130-159  mg/dL   Borderline  244-010  mg/dL   High  >272     mg/dL   Very High Performed at [facility], [address], [place], [place] [address]   Magnesium     Status: None   Collection Time: 09/05/23  4:45 AM  Result Value Ref Range   Magnesium 2.0 1.7 - 2.4 mg/dL    Comment: Performed at [facility], [address] [place], [place] [address]  TSH     Status: None   Collection Time: 09/05/23  4:45 AM  Result Value Ref Range   TSH 1.788 0.350 - 4.500 uIU/mL    Comment: Performed by a 3rd Generation assay with a functional sensitivity of <=0.01 uIU/mL. Performed at [facility], [address], [place], [place] [address]    DG CHEST PORT 1 VIEW  Result  Date: 09/05/2023 CLINICAL DATA:  [address]75 TIA (transient ischemic attack) [id number] EXAM: PORTABLE CHEST 1 VIEW COMPARISON:  None Available. FINDINGS: No pleural effusion. No pneumothorax. Normal cardiac and mediastinal contours. Bibasilar atelectasis. No radiographically apparent displaced rib fractures. IMPRESSION: Bibasilar atelectasis. Electronically Signed   By: Lorenza Cambridge M.D.   On: 09/05/2023 08:42   CT ANGIO HEAD NECK W WO CM  Result Date: 09/05/2023 CLINICAL DATA:  Abnormal sensation and jaw and neck EXAM: CT ANGIOGRAPHY HEAD AND NECK WITH AND WITHOUT CONTRAST TECHNIQUE: Multidetector CT imaging of the head and neck was performed using the standard protocol during bolus administration of intravenous contrast. Multiplanar CT image reconstructions and MIPs were obtained to evaluate the vascular anatomy. Carotid stenosis measurements (when applicable) are obtained utilizing NASCET criteria, using the distal internal carotid diameter as the denominator. RADIATION DOSE REDUCTION: This exam was performed according to the departmental dose-optimization program which includes automated exposure control, adjustment of the mA and/or kV according to patient size and/or use of iterative reconstruction technique. CONTRAST:  75mL OMNIPAQUE IOHEXOL 350 MG/ML SOLN COMPARISON:  CTA head/neck 07/22/2020 FINDINGS: CTA NECK FINDINGS Aortic arch: There is mild calcified plaque in the imaged aortic arch. The origins of the major branch vessels are patent the subclavian arteries are patent to the level imaged. Right carotid system: The right common, internal, and external carotid arteries are patent, without hemodynamically significant stenosis or occlusion. There is unchanged tortuosity and mild irregularity of the internal carotid artery. There is no evidence of dissection or aneurysm. Left carotid system: The left common, internal, and external carotid arteries are patent, without hemodynamically significant stenosis or  occlusion. There is unchanged tortuosity and mild irregularity of the internal carotid artery there is no evidence of dissection or aneurysm. Vertebral arteries: The vertebral arteries are patent, without hemodynamically significant stenosis or occlusion. There is no evidence of dissection or aneurysm. Skeleton: There is no  acute osseous abnormality or suspicious osseous lesion there is no visible canal hematoma. Other neck: The soft tissues of the neck are unremarkable. Upper chest: There is emphysema in the lung apices. Review of the MIP images confirms the above findings CTA HEAD FINDINGS Anterior circulation: The intracranial ICAs are patent, without significant stenosis or occlusion. 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 Streak artifact from the clip degrades evaluation of the surrounding vasculature. Within this confine, the right MCA branches appear patent, without proximal stenosis or occlusion. The left MCA is patent, without proximal stenosis or occlusion. The bilateral ACAS are patent, without proximal stenosis or occlusion. There is a common A2 trunk, a normal variant. There is no definite recurrent aneurysm of the right MCA bifurcation. There is no new aneurysm or AVM. Posterior circulation: The bilateral V4 segments are patent. The basilar artery is patent. The major cerebellar arteries appear patent. The bilateral PCAs are patent, without proximal stenosis or occlusion. Diminutive posterior communicating arteries are identified. There is no aneurysm or AVM. Venous sinuses: Patent. Anatomic variants: As above. Review of the MIP images confirms the above findings IMPRESSION: 1. Patent vasculature of the head and neck with no hemodynamically significant stenosis or occlusion. 2. Status post clipping of the right MCA bifurcation aneurysm without definite evidence of recurrent aneurysm. 3. Emphysema. Electronically Signed   By: Lesia Hausen M.D.   On: 09/05/2023 08:34    CT HEAD WO CONTRAST  Result Date: 09/05/2023 CLINICAL DATA:  Weird sensation in left jaw and arm EXAM: CT HEAD WITHOUT CONTRAST TECHNIQUE: Contiguous axial images were obtained from the base of the skull through the vertex without intravenous contrast. RADIATION DOSE REDUCTION: This exam was performed according to the departmental dose-optimization program which includes automated exposure control, adjustment of the mA and/or kV according to patient size and/or use of iterative reconstruction technique. COMPARISON:  12/06/2020 FINDINGS: Brain: No evidence of acute infarction, hemorrhage, mass, mass effect, or midline shift. No hydrocephalus or extra-axial fluid collection. Redemonstrated chronic encephalomalacia in the right frontal and temporal lobe Vascular: No hyperdense vessel.  Right MCA territory aneurysm clip. Skull: Negative for fracture or focal lesion. Prior right frontal craniotomy. Sinuses/Orbits: No acute finding. Other: The mastoid air cells are well aerated. IMPRESSION: No acute intracranial process. Electronically Signed   By: Wiliam Ke M.D.   On: 09/05/2023 02:45    Pending Labs Unresulted Labs (From admission, onward)     Start     Ordered   09/06/23 0500  CBC  Tomorrow morning,   R        09/05/23 0738   09/06/23 0500  Basic metabolic panel  Tomorrow morning,   R        09/05/23 0738   09/05/23 0800  HIV Antibody (routine testing w rflx)  Once,   R        09/05/23 0800   09/05/23 0735  Hemoglobin A1c  (Labs)  Once,   R       Comments: To assess prior glycemic control    09/05/23 0737            Vitals/Pain Today's Vitals   09/05/23 1130 09/05/23 1145 09/05/23 1200 09/05/23 1215  BP: (!) 151/72 135/70 (!) 152/95 (!) 143/123  Pulse: 64 66 66 73  Resp: 15 19 17 16   Temp:      TempSrc:      SpO2: 98% 96% 92% 97%  Weight:      Height:  PainSc:        Isolation Precautions No active isolations  Medications Medications  nicotine (NICODERM CQ - dosed  in mg/24 hours) patch 14 mg (14 mg Transdermal Not Given 09/05/23 1216)  citalopram (CELEXA) tablet 10 mg (10 mg Oral Given 09/05/23 1221)  gabapentin (NEURONTIN) capsule 300 mg (300 mg Oral Given 09/05/23 1215)  fluticasone (FLONASE) 50 MCG/ACT nasal spray 1 spray (has no administration in time range)  loratadine (CLARITIN) tablet 10 mg (10 mg Oral Given 09/05/23 1215)  Muscle Rub CREA 1 Application (has no administration in time range)   stroke: early stages of recovery book (has no administration in time range)  0.9 %  sodium chloride infusion (has no administration in time range)  acetaminophen (TYLENOL) tablet 650 mg (650 mg Oral Given 09/05/23 1215)    Or  acetaminophen (TYLENOL) 160 MG/5ML solution 650 mg ( Per Tube See Alternative 09/05/23 1215)    Or  acetaminophen (TYLENOL) suppository 650 mg ( Rectal See Alternative 09/05/23 1215)  senna-docusate (Senokot-S) tablet 1 tablet (has no administration in time range)  hydrALAZINE (APRESOLINE) injection 10 mg (has no administration in time range)  iohexol (OMNIPAQUE) 350 MG/ML injection 75 mL (75 mLs Intravenous Contrast Given 09/05/23 0803)    Mobility walks     Focused Assessments Neuro Assessment Handoff:  Swallow screen pass? Yes    NIH Stroke Scale  Dizziness Present: No Headache Present: No Interval: Shift assessment Level of Consciousness (1a.)   : Alert, keenly responsive LOC Questions (1b. )   : Answers both questions correctly LOC Commands (1c. )   : Performs both tasks correctly Best Gaze (2. )  : Normal Visual (3. )  : No visual loss Facial Palsy (4. )    : Normal symmetrical movements Motor Arm, Left (5a. )   : No drift Motor Arm, Right (5b. ) : No drift Motor Leg, Left (6a. )  : No drift Motor Leg, Right (6b. ) : No drift Limb Ataxia (7. ): Absent Sensory (8. )  : Normal, no sensory loss Best Language (9. )  : No aphasia Dysarthria (10. ): Normal Extinction/Inattention (11.)   : No Abnormality Complete NIHSS  TOTAL: 0     Neuro Assessment: Within Defined Limits Neuro Checks:   Shift assessment (09/05/23 0556)  Has TPA been given? No If patient is a Neuro Trauma and patient is going to OR before floor call report to 4N Charge nurse: [phone] or [phone]   R Recommendations: See Admitting Provider Note  Report given to:   Additional Notes:

## 2023-09-05 NOTE — Progress Notes (Signed)
RN stopped Tech before entry. Patient is agitated and speaking with MD at this time. Pt has several pending procedures such as ECHO and MRI. EEG is not urgent and will be held off. EEG tech will check back with RN in about an hour or so.

## 2023-09-06 ENCOUNTER — Observation Stay (HOSPITAL_COMMUNITY): Payer: Medicare (Managed Care)

## 2023-09-06 DIAGNOSIS — G459 Transient cerebral ischemic attack, unspecified: Secondary | ICD-10-CM | POA: Diagnosis not present

## 2023-09-06 DIAGNOSIS — R569 Unspecified convulsions: Principal | ICD-10-CM

## 2023-09-06 MED ORDER — LEVETIRACETAM 500 MG PO TABS: 500.0000 mg | ORAL_TABLET | Freq: Two times a day (BID) | ORAL | 0 refills | Status: DC

## 2023-09-06 NOTE — Progress Notes (Signed)
STROKE TEAM PROGRESS NOTE   SUBJECTIVE (INTERVAL HISTORY) No family is at the bedside. Pt at her baseline, sitting in chair, eating lunch. I discussed with her that I feel seizure is most likely her diagnosis. She is on keppra. Will schedule her to see Dr. Pearlean Brownie ASAP. She had focal seizure, never had LOC, OK for her to drive at this time but cautious education provided along with other seizure precautions. She expressed understanding.    OBJECTIVE Temp:  [97.8 F (36.6 C)-98.5 F (36.9 C)] 98.5 F (36.9 C) (09/19 1115) Pulse Rate:  [60-69] 69 (09/19 1115) Cardiac Rhythm: Normal sinus rhythm (09/19 0738) Resp:  [16-20] 20 (09/19 1115) BP: (126-147)/(49-77) 141/58 (09/19 1115) SpO2:  [88 %-98 %] 97 % (09/19 1115)  No results for input(s): "GLUCAP" in the last 168 hours. Recent Labs  Lab 09/05/23 0445  NA 141  K 4.2  CL 105  CO2 25  GLUCOSE 98  BUN 9  CREATININE 0.64  CALCIUM 8.9  MG 2.0   Recent Labs  Lab 09/05/23 0445  AST 27  ALT 19  ALKPHOS 102  BILITOT 0.6  PROT 6.8  ALBUMIN 3.5   Recent Labs  Lab 09/05/23 0156  WBC 6.7  NEUTROABS 2.3  HGB 13.3  HCT 41.2  MCV 91.4  PLT 76*   No results for input(s): "CKTOTAL", "CKMB", "CKMBINDEX", "TROPONINI" in the last 168 hours. Recent Labs    09/05/23 0156  LABPROT 12.5  INR 0.9   Recent Labs    09/05/23 0430  COLORURINE YELLOW  LABSPEC <1.005*  PHURINE 6.0  GLUCOSEU NEGATIVE  HGBUR NEGATIVE  BILIRUBINUR NEGATIVE  KETONESUR NEGATIVE  PROTEINUR NEGATIVE  NITRITE NEGATIVE  LEUKOCYTESUR TRACE*       Component Value Date/Time   CHOL 139 09/05/2023 0445   CHOL 142 03/05/2023 1448   TRIG 67 09/05/2023 0445   HDL 41 09/05/2023 0445   HDL 41 03/05/2023 1448   CHOLHDL 3.4 09/05/2023 0445   VLDL 13 09/05/2023 0445   LDLCALC 85 09/05/2023 0445   LDLCALC 77 03/05/2023 1448   Lab Results  Component Value Date   HGBA1C 6.4 (H) 09/05/2023      Component Value Date/Time   LABOPIA NONE DETECTED 09/05/2023  0430   COCAINSCRNUR NONE DETECTED 09/05/2023 0430   LABBENZ NONE DETECTED 09/05/2023 0430   AMPHETMU NONE DETECTED 09/05/2023 0430   THCU NONE DETECTED 09/05/2023 0430   LABBARB NONE DETECTED 09/05/2023 0430    Recent Labs  Lab 09/05/23 0156  ETH <10    I have personally reviewed the radiological images below and agree with the radiology interpretations.  EEG adult  Result Date: 09/06/2023 Charlsie Quest, MD     09/06/2023  8:51 AM Patient Name: Renee Murray MRN: [id number] Epilepsy Attending: Charlsie Quest Referring Physician/Provider: Caryl Pina, MD Date: 09/06/2023 Duration: 32.29 mins Patient history: 67yo F with left face, arm numbness and arm weakness getting eeg to evaluate for seizure Level of alertness: Awake, asleep AEDs during EEG study: LEV, GBP Technical aspects: This EEG study was done with scalp electrodes positioned according to the 10-20 International system of electrode placement. Electrical activity was reviewed with band pass filter of 1-70Hz , sensitivity of 7 uV/mm, display speed of 23mm/sec with a 60Hz  notched filter applied as appropriate. EEG data were recorded continuously and digitally stored.  Video monitoring was available and reviewed as appropriate. Description: The posterior dominant rhythm consists of 8-9 Hz activity of moderate voltage (25-35 uV) seen predominantly in  posterior head regions, symmetric and reactive to eye opening and eye closing. Sleep was characterized by vertex waves, sleep spindles (12 to 14 Hz), maximal frontocentral region. Hyperventilation and photic stimulation were not performed.   IMPRESSION: This study is within normal limits. No seizures or epileptiform discharges were seen throughout the recording. A normal interictal EEG does not exclude the diagnosis of epilepsy. Charlsie Quest   ECHOCARDIOGRAM COMPLETE  Result Date: 09/05/2023    ECHOCARDIOGRAM REPORT   Patient Name:   Renee Murray Date of Exam: 09/05/2023 Medical Rec  #:  [id number]        Height:       59.0 in Accession #:    [id number]       Weight:       154.3 lb Date of Birth:  1956-12-08         BSA:          1.652 m Patient Age:    67 years         BP:           161/72 mmHg Patient Gender: F                HR:           70 bpm. Exam Location:  Inpatient Procedure: 2D Echo, Cardiac Doppler and Color Doppler Indications:    TIA  History:        Patient has no prior history of Echocardiogram examinations.  Sonographer:    Darlys Gales Referring Phys: [id number] RONDELL A SMITH IMPRESSIONS  1. Left ventricular ejection fraction, by estimation, is 60 to 65%. The left ventricle has normal function. The left ventricle has no regional wall motion abnormalities. Left ventricular diastolic parameters are consistent with Grade I diastolic dysfunction (impaired relaxation).  2. Right ventricular systolic function is normal. The right ventricular size is normal. There is normal pulmonary artery systolic pressure. The estimated right ventricular systolic pressure is 29.6 mmHg.  3. Left atrial size was moderately dilated.  4. The mitral valve is normal in structure. Trivial mitral valve regurgitation.  5. The aortic valve is tricuspid. There is mild calcification of the aortic valve. There is mild thickening of the aortic valve. Aortic valve regurgitation is mild. Aortic valve sclerosis/calcification is present, without any evidence of aortic stenosis.  6. The inferior vena cava is normal in size with greater than 50% respiratory variability, suggesting right atrial pressure of 3 mmHg. FINDINGS  Left Ventricle: Left ventricular ejection fraction, by estimation, is 60 to 65%. The left ventricle has normal function. The left ventricle has no regional wall motion abnormalities. The left ventricular internal cavity size was normal in size. There is  no left ventricular hypertrophy. Left ventricular diastolic parameters are consistent with Grade I diastolic dysfunction (impaired relaxation). Normal  left ventricular filling pressure. Right Ventricle: The right ventricular size is normal. No increase in right ventricular wall thickness. Right ventricular systolic function is normal. There is normal pulmonary artery systolic pressure. The tricuspid regurgitant velocity is 2.58 m/s, and  with an assumed right atrial pressure of 3 mmHg, the estimated right ventricular systolic pressure is 29.6 mmHg. Left Atrium: Left atrial size was moderately dilated. Right Atrium: Right atrial size was normal in size. Pericardium: There is no evidence of pericardial effusion. Presence of epicardial fat layer. Mitral Valve: The mitral valve is normal in structure. Trivial mitral valve regurgitation. Tricuspid Valve: The tricuspid valve is normal in structure. Tricuspid valve regurgitation is trivial. Aortic Valve:  The aortic valve is tricuspid. There is mild calcification of the aortic valve. There is mild thickening of the aortic valve. Aortic valve regurgitation is mild. Aortic regurgitation PHT measures 669 msec. Aortic valve sclerosis/calcification is present, without any evidence of aortic stenosis. Aortic valve mean gradient measures 7.0 mmHg. Aortic valve peak gradient measures 12.7 mmHg. Aortic valve area, by VTI measures 2.31 cm. Pulmonic Valve: The pulmonic valve was not well visualized. Pulmonic valve regurgitation is not visualized. No evidence of pulmonic stenosis. Aorta: The aortic root and ascending aorta are structurally normal, with no evidence of dilitation. Venous: The inferior vena cava is normal in size with greater than 50% respiratory variability, suggesting right atrial pressure of 3 mmHg. IAS/Shunts: No atrial level shunt detected by color flow Doppler.  LEFT VENTRICLE PLAX 2D LVIDd:         3.50 cm   Diastology LVIDs:         2.00 cm   LV e' medial:    11.50 cm/s LV PW:         0.80 cm   LV E/e' medial:  5.8 LV IVS:        1.10 cm   LV e' lateral:   10.00 cm/s LVOT diam:     1.85 cm   LV E/e' lateral:  6.7 LV SV:         90 LV SV Index:   54 LVOT Area:     2.69 cm  RIGHT VENTRICLE RV S prime:     13.50 cm/s TAPSE (M-mode): 2.3 cm LEFT ATRIUM             Index        RIGHT ATRIUM           Index LA Vol (A2C):   57.4 ml 34.75 ml/m  RA Area:     10.30 cm LA Vol (A4C):   60.6 ml 36.68 ml/m  RA Volume:   18.40 ml  11.14 ml/m LA Biplane Vol: 60.0 ml 36.32 ml/m  AORTIC VALVE AV Area (Vmax):    2.23 cm AV Area (Vmean):   2.24 cm AV Area (VTI):     2.31 cm AV Vmax:           178.00 cm/s AV Vmean:          125.000 cm/s AV VTI:            0.387 m AV Peak Grad:      12.7 mmHg AV Mean Grad:      7.0 mmHg LVOT Vmax:         148.00 cm/s LVOT Vmean:        104.000 cm/s LVOT VTI:          0.333 m LVOT/AV VTI ratio: 0.86 AI PHT:            669 msec  AORTA Ao Asc diam: 2.90 cm MITRAL VALVE                TRICUSPID VALVE MV Area (PHT): 3.99 cm     TR Peak grad:   26.6 mmHg MV Decel Time: 190 msec     TR Vmax:        258.00 cm/s MV E velocity: 66.60 cm/s MV A velocity: 103.00 cm/s  SHUNTS MV E/A ratio:  0.65         Systemic VTI:  0.33 m  Systemic Diam: 1.85 cm Thurmon Fair MD Electronically signed by Thurmon Fair MD Signature Date/Time: 09/05/2023/4:53:12 PM    Final    MR BRAIN WO CONTRAST  Result Date: 09/05/2023 CLINICAL DATA:  Stroke, follow up; Cervical radiculopathy, no red flags EXAM: MRI HEAD WITHOUT CONTRAST MRI CERVICAL SPINE WITHOUT CONTRAST TECHNIQUE: Multiplanar, multiecho pulse sequences of the brain and surrounding structures, and cervical spine, to include the craniocervical junction and cervicothoracic junction, were obtained without intravenous contrast. COMPARISON:  None Available. FINDINGS: MRI HEAD FINDINGS Brain: Encephalomalacia in the right temporal lobe with evidence of prior hemorrhage in this region. No acute infarction, acute hemorrhage, hydrocephalus, extra-axial collection or mass lesion. Small remote left cerebellar infarcts. Vascular: Right MCA territory  aneurysm clip. Skull and upper cervical spine: Normal marrow signal. Sinuses/Orbits: Clear sinuses.  No acute orbital findings. Other: No mastoid effusions. MRI CERVICAL SPINE FINDINGS Alignment: Straightening.  No substantial sagittal subluxation. Vertebrae: No fracture, evidence of discitis, or bone lesion. Cord: Normal cord signal. Posterior Fossa, vertebral arteries, paraspinal tissues: Visualized vertebral artery flow voids are maintained. Disc levels: C2-C3: Facet arthropathy.  No significant stenosis. C3-C4: Motion limited. Left greater than right facet and uncovertebral hypertrophy. Resulting moderate to severe left and mild right foraminal stenosis. Mild canal stenosis. C4-C5: Bilateral facet and uncovertebral hypertrophy. Mild left foraminal stenosis. C5-C6: Posterior disc osteophyte complex with bilateral facet and uncovertebral hypertrophy. Resulting moderate right and mild left foraminal stenosis with mild canal stenosis. C6-C7: Bilateral facet uncovertebral hypertrophy. Mild bilateral foraminal stenosis. Mild canal stenosis. C7-T1: Facet uncovertebral hypertrophy.  No significant stenosis. IMPRESSION: MRI head: 1. No evidence of acute intracranial abnormality. 2. Right temporal encephalomalacia. MRI cervical spine: 1. Motion limited study.  Within this limitation: 2. At C3-C4, suspected moderate to severe left foraminal stenosis with mild canal stenosis. 3. At C4-C5, suspected moderate to severe right foraminal stenosis and mild canal stenosis. 4. Additional multilevel mild canal and foraminal stenosis is detailed above. Electronically Signed   By: Feliberto Harts M.D.   On: 09/05/2023 15:57   MR CERVICAL SPINE WO CONTRAST  Result Date: 09/05/2023 CLINICAL DATA:  Stroke, follow up; Cervical radiculopathy, no red flags EXAM: MRI HEAD WITHOUT CONTRAST MRI CERVICAL SPINE WITHOUT CONTRAST TECHNIQUE: Multiplanar, multiecho pulse sequences of the brain and surrounding structures, and cervical spine, to  include the craniocervical junction and cervicothoracic junction, were obtained without intravenous contrast. COMPARISON:  None Available. FINDINGS: MRI HEAD FINDINGS Brain: Encephalomalacia in the right temporal lobe with evidence of prior hemorrhage in this region. No acute infarction, acute hemorrhage, hydrocephalus, extra-axial collection or mass lesion. Small remote left cerebellar infarcts. Vascular: Right MCA territory aneurysm clip. Skull and upper cervical spine: Normal marrow signal. Sinuses/Orbits: Clear sinuses.  No acute orbital findings. Other: No mastoid effusions. MRI CERVICAL SPINE FINDINGS Alignment: Straightening.  No substantial sagittal subluxation. Vertebrae: No fracture, evidence of discitis, or bone lesion. Cord: Normal cord signal. Posterior Fossa, vertebral arteries, paraspinal tissues: Visualized vertebral artery flow voids are maintained. Disc levels: C2-C3: Facet arthropathy.  No significant stenosis. C3-C4: Motion limited. Left greater than right facet and uncovertebral hypertrophy. Resulting moderate to severe left and mild right foraminal stenosis. Mild canal stenosis. C4-C5: Bilateral facet and uncovertebral hypertrophy. Mild left foraminal stenosis. C5-C6: Posterior disc osteophyte complex with bilateral facet and uncovertebral hypertrophy. Resulting moderate right and mild left foraminal stenosis with mild canal stenosis. C6-C7: Bilateral facet uncovertebral hypertrophy. Mild bilateral foraminal stenosis. Mild canal stenosis. C7-T1: Facet uncovertebral hypertrophy.  No significant stenosis. IMPRESSION: MRI head: 1. No  evidence of acute intracranial abnormality. 2. Right temporal encephalomalacia. MRI cervical spine: 1. Motion limited study.  Within this limitation: 2. At C3-C4, suspected moderate to severe left foraminal stenosis with mild canal stenosis. 3. At C4-C5, suspected moderate to severe right foraminal stenosis and mild canal stenosis. 4. Additional multilevel mild canal  and foraminal stenosis is detailed above. Electronically Signed   By: Feliberto Harts M.D.   On: 09/05/2023 15:57   DG CHEST PORT 1 VIEW  Result Date: 09/05/2023 CLINICAL DATA:  16073 TIA (transient ischemic attack) 71062 EXAM: PORTABLE CHEST 1 VIEW COMPARISON:  None Available. FINDINGS: No pleural effusion. No pneumothorax. Normal cardiac and mediastinal contours. Bibasilar atelectasis. No radiographically apparent displaced rib fractures. IMPRESSION: Bibasilar atelectasis. Electronically Signed   By: Lorenza Cambridge M.D.   On: 09/05/2023 08:42   CT ANGIO HEAD NECK W WO CM  Result Date: 09/05/2023 CLINICAL DATA:  Abnormal sensation and jaw and neck EXAM: CT ANGIOGRAPHY HEAD AND NECK WITH AND WITHOUT CONTRAST TECHNIQUE: Multidetector CT imaging of the head and neck was performed using the standard protocol during bolus administration of intravenous contrast. Multiplanar CT image reconstructions and MIPs were obtained to evaluate the vascular anatomy. Carotid stenosis measurements (when applicable) are obtained utilizing NASCET criteria, using the distal internal carotid diameter as the denominator. RADIATION DOSE REDUCTION: This exam was performed according to the departmental dose-optimization program which includes automated exposure control, adjustment of the mA and/or kV according to patient size and/or use of iterative reconstruction technique. CONTRAST:  75mL OMNIPAQUE IOHEXOL 350 MG/ML SOLN COMPARISON:  CTA head/neck 07/22/2020 FINDINGS: CTA NECK FINDINGS Aortic arch: There is mild calcified plaque in the imaged aortic arch. The origins of the major branch vessels are patent the subclavian arteries are patent to the level imaged. Right carotid system: The right common, internal, and external carotid arteries are patent, without hemodynamically significant stenosis or occlusion. There is unchanged tortuosity and mild irregularity of the internal carotid artery. There is no evidence of dissection or  aneurysm. Left carotid system: The left common, internal, and external carotid arteries are patent, without hemodynamically significant stenosis or occlusion. There is unchanged tortuosity and mild irregularity of the internal carotid artery there is no evidence of dissection or aneurysm. Vertebral arteries: The vertebral arteries are patent, without hemodynamically significant stenosis or occlusion. There is no evidence of dissection or aneurysm. Skeleton: There is no acute osseous abnormality or suspicious osseous lesion there is no visible canal hematoma. Other neck: The soft tissues of the neck are unremarkable. Upper chest: There is emphysema in the lung apices. Review of the MIP images confirms the above findings CTA HEAD FINDINGS Anterior circulation: The intracranial ICAs are patent, without significant stenosis or occlusion. 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 Streak artifact from the clip degrades evaluation of the surrounding vasculature. Within this confine, the right MCA branches appear patent, without proximal stenosis or occlusion. The left MCA is patent, without proximal stenosis or occlusion. The bilateral ACAS are patent, without proximal stenosis or occlusion. There is a common A2 trunk, a normal variant. There is no definite recurrent aneurysm of the right MCA bifurcation. There is no new aneurysm or AVM. Posterior circulation: The bilateral V4 segments are patent. The basilar artery is patent. The major cerebellar arteries appear patent. The bilateral PCAs are patent, without proximal stenosis or occlusion. Diminutive posterior communicating arteries are identified. There is no aneurysm or AVM. Venous sinuses: Patent. Anatomic variants: As above. Review of the  MIP images confirms the above findings IMPRESSION: 1. Patent vasculature of the head and neck with no hemodynamically significant stenosis or occlusion. 2. Status post clipping of the right MCA  bifurcation aneurysm without definite evidence of recurrent aneurysm. 3. Emphysema. Electronically Signed   By: Lesia Hausen M.D.   On: 09/05/2023 08:34   CT HEAD WO CONTRAST  Result Date: 09/05/2023 CLINICAL DATA:  Weird sensation in left jaw and arm EXAM: CT HEAD WITHOUT CONTRAST TECHNIQUE: Contiguous axial images were obtained from the base of the skull through the vertex without intravenous contrast. RADIATION DOSE REDUCTION: This exam was performed according to the departmental dose-optimization program which includes automated exposure control, adjustment of the mA and/or kV according to patient size and/or use of iterative reconstruction technique. COMPARISON:  12/06/2020 FINDINGS: Brain: No evidence of acute infarction, hemorrhage, mass, mass effect, or midline shift. No hydrocephalus or extra-axial fluid collection. Redemonstrated chronic encephalomalacia in the right frontal and temporal lobe Vascular: No hyperdense vessel.  Right MCA territory aneurysm clip. Skull: Negative for fracture or focal lesion. Prior right frontal craniotomy. Sinuses/Orbits: No acute finding. Other: The mastoid air cells are well aerated. IMPRESSION: No acute intracranial process. Electronically Signed   By: Wiliam Ke M.D.   On: 09/05/2023 02:45     PHYSICAL EXAM  Temp:  [97.8 F (36.6 C)-98.5 F (36.9 C)] 98.5 F (36.9 C) (09/19 1115) Pulse Rate:  [60-69] 69 (09/19 1115) Resp:  [16-20] 20 (09/19 1115) BP: (126-147)/(49-77) 141/58 (09/19 1115) SpO2:  [88 %-98 %] 97 % (09/19 1115)  General - Well nourished, well developed, in no apparent distress.  Ophthalmologic - fundi not visualized due to noncooperation.  Cardiovascular - Regular rhythm and rate.  Mental Status -  Level of arousal and orientation to time, place, and person were intact. Language including expression, naming, repetition, comprehension was assessed and found intact. Attention span and concentration were normal. Recent and remote  memory were intact. Fund of Knowledge was assessed and was intact.  Cranial Nerves II - XII - II - Visual field intact OU. III, IV, VI - Extraocular movements intact. V - Facial sensation intact bilaterally. VII - Facial movement intact bilaterally. VIII - Hearing & vestibular intact bilaterally. X - Palate elevates symmetrically. XI - Chin turning & shoulder shrug intact bilaterally. XII - Tongue protrusion intact.  Motor Strength - The patient's strength was normal in all extremities and pronator drift was absent.  Bulk was normal and fasciculations were absent.   Motor Tone - Muscle tone was assessed at the neck and appendages and was normal.  Reflexes - The patient's reflexes were symmetrical in all extremities and she had no pathological reflexes.  Sensory - Light touch, temperature/pinprick were assessed and were symmetrical.    Coordination - The patient had normal movements in the hands and feet with no ataxia or dysmetria.  Tremor was absent.  Gait and Station - deferred.   ASSESSMENT/PLAN Renee Murray is a 67 y.o. female with history of HTN, DM, R MCA aneurysm s/p clipping in 2016, smoker admitted for recurrent episodes of left face, arm numbness and arm weakness. No tPA given due to symptoms rapid improving.    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  ED presentation in  07/2020 with stereotyped episodes of left jaw pain and left arm pain associated with weakness. CT and CTA negative. Per Dr. Iver Nestle "The description of her events is concerning for possible focal seizures given the premonition she feels at first, the evolution over time and the gradual resolution subsequent to the initial event.  However the lack of rhythmic shaking is somewhat atypical."  She was recommended for outpt EEG at that time. She followed with Dr. Pearlean Brownie in 09/2020 and recommend MRI brain and C-spine but was not done for unknown reason. 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 Howeve, her episodes are totally different from her CTS symptoms.  CT no acute finding but chronic encephalomalacia in the right frontal and temporal lobe CTA head and neck Status post clipping of the right MCA bifurcation aneurysm without definite evidence of recurrent aneurysm MRI brain unremarkable, Right temporal encephalomalacia.  MRI C-spine C3-4 left and C4-5 right mod to severe foraminal stenosis 2D Echo EF 60-65% EEG normal  LDL 85 HgbA1c 6.4 UDS neg SCDs for VTE prophylaxis No antithrombotic prior to admission, now on No antithrombotic.  Start keppra 500 bid Ongoing aggressive stroke risk factor management Therapy recommendations:  pending Disposition:  pending, follow up with Dr. Pearlean Brownie at [facility] in 2 weeks  Diabetes, controlled HgbA1c 6.4, goal < 7.0 CBG monitoring SSI DM education and close PCP follow up  Hypertension Stable BP stable Long term BP goal normotensive  Lipid management Home meds:  none  LDL 85, goal < 100 No statin needed as LDL at goal  Tobacco abuse Current smoker Smoking cessation counseling provided Nicotine patch provided Pt is willing to quit  Other Stroke Risk Factors Advanced age Obesity, Body mass index is 31.17 kg/m.   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 Wende Crease day # 0  Neurology will sign off. Please call with questions. Pt will follow up with stroke clinic Dr. Pearlean Brownie at [facility] in about 2 weeks. Thanks for the consult.   Marvel Plan, MD PhD Stroke Neurology 09/06/2023 4:44 PM    To contact Stroke  Continuity provider, please refer to [email or web address]. After hours, contact General Neurology

## 2023-09-06 NOTE — Care Management Obs Status (Signed)
MEDICARE OBSERVATION STATUS NOTIFICATION   Patient Details  Name: Renee Murray MRN: [id number] Date of Birth: 25-Feb-1956   Medicare Observation Status Notification Given:  Yes    Kermit Balo, RN 09/06/2023, 12:11 PM

## 2023-09-06 NOTE — Progress Notes (Signed)
OT Cancellation Note  Patient Details Name: Renee Murray MRN: [id number] DOB: 1956/09/26   Cancelled Treatment:    Reason Eval/Treat Not Completed: OT screened, no needs identified, will sign off Pt Independent with mobility, no new concerns with ADL mgmt. No formal OT eval needed at this time. Please reconsult if needs change.  Lorre Munroe 09/06/2023, 10:18 AM

## 2023-09-06 NOTE — Procedures (Signed)
Patient Name: [name]  MRN: [id number]  Epilepsy Attending: Charlsie Quest  Referring Physician/Provider: Caryl Pina, MD  Date: 09/06/2023 Duration: 32.29 mins  Patient history: 67yo F with left face, arm numbness and arm weakness getting eeg to evaluate for seizure  Level of alertness: Awake, asleep  AEDs during EEG study: LEV, GBP  Technical aspects: This EEG study was done with scalp electrodes positioned according to the 10-20 International system of electrode placement. Electrical activity was reviewed with band pass filter of 1-70Hz , sensitivity of 7 uV/mm, display speed of 41mm/sec with a 60Hz  notched filter applied as appropriate. EEG data were recorded continuously and digitally stored.  Video monitoring was available and reviewed as appropriate.  Description: The posterior dominant rhythm consists of 8-9 Hz activity of moderate voltage (25-35 uV) seen predominantly in posterior head regions, symmetric and reactive to eye opening and eye closing. Sleep was characterized by vertex waves, sleep spindles (12 to 14 Hz), maximal frontocentral region. Hyperventilation and photic stimulation were not performed.     IMPRESSION: This study is within normal limits. No seizures or epileptiform discharges were seen throughout the recording.  A normal interictal EEG does not exclude the diagnosis of epilepsy.  Renee Murray

## 2023-09-06 NOTE — TOC Transition Note (Addendum)
Transition of Care [facility]) - CM/SW Discharge Note   Patient Details  Name: Renee Murray MRN: [id number] Date of Birth: [birth year]  Transition of Care [facility] CM/SW Contact:  Kermit Balo, RN Phone Number: 09/06/2023, 12:08 PM   Clinical Narrative:    Pt discharging home with self care. No needs per TOC. Pt requesting a cane for home. CM has ordered it through [employer] and it will be delivered to the room.    Final next level of care: Home/Self Care Barriers to Discharge: No Barriers Identified   Patient Goals and CMS Choice      Discharge Placement                         Discharge Plan and Services Additional resources added to the After Visit Summary for     Discharge Planning Services: CM Consult                                 Social Determinants of Health (SDOH) Interventions SDOH Screenings   Food Insecurity: No Food Insecurity (09/05/2023)  Housing: Low Risk  (09/05/2023)  Transportation Needs: No Transportation Needs (09/05/2023)  Utilities: Not At Risk (09/05/2023)  Depression (PHQ2-9): Low Risk  (03/08/2023)  Recent Concern: Depression (PHQ2-9) - Medium Risk (03/05/2023)  Tobacco Use: High Risk (09/05/2023)     Readmission Risk Interventions     No data to display

## 2023-09-06 NOTE — Progress Notes (Signed)
EEG complete - results pending 

## 2023-09-06 NOTE — Discharge Summary (Signed)
Physician Discharge Summary  Patient: Renee Murray NWG:[id number] DOB: Dec 23, 1955   Code Status: Full Code Admit date: 09/05/2023 Discharge date: 09/06/2023 Disposition: Home, No home health services recommended PCP: Marcine Matar, MD  Recommendations for Outpatient Follow-up:  Follow up with PCP within 1-2 weeks Regarding general hospital follow up and preventative care Follow up with neurology in 2 weeks  Discharge Diagnoses:  Principal Problem:   TIA (transient ischemic attack) Active Problems:   Hypertension, goal below 150/90   Tobacco abuse   Type 2 diabetes mellitus without complication, without long-term current use of insulin (HCC)   Thrombocytopenia (HCC)   Brain aneurysm   Obesity (BMI 30-39.9)   Seizure [facility])  Brief Hospital Course Summary: Renee Murray is a 67 y.o. female with medical history significant of hypertension, diabetes, brain aneurysm s/p clipping, and tobacco abuse who presented with stroke like symptoms she described as tingling in her arm which had happened several times before and resolved but this is the most severe she's ever experienced. She felt like she was unable to move the arm. The symptoms resolved while she was riding with EMS on the way to the hospital. Denies having any headache, change in vision, change in speech, chest pain, palpitations, nausea, vomiting, diarrhea, dysuria, or recent sick contacts to her knowledge. Patient reported prior history of possible seizure back 2016 following her aneurysm clipping, but workup was negative and did not continue her antiseizure medication.     In the emergency department patient was noted to be afebrile, pulse 57-63, blood pressures elevated up to 161/72, and all other vital signs maintained.  Labs significant for platelets 76.   CT scan of the head did not note any acute abnormality.  Brain MRI negative for acute intracranial abnormalities. Presence of known right temporal encephalomalacia  observed.  Neurology was consulted and evaluated for stroke vs seizure.  EEG performed and was negative for epileptic changes.  She was suspected to be having focal seizures and was started on Keppra. No reoccurrence while inpatient.    09/06/23 -discharged with keppra for seizure treatment in stable condition.   Discharge Condition: Good, improved Recommended discharge diet: Regular healthy diet  Consultations: Neurology   Procedures/Studies: EEG Echo   Discharge Instructions     Ambulatory referral to Neurology   Complete by: As directed    Follow up with Dr. Pearlean Brownie at [facility] in 2 weeks. Too complicated for NP to follow. Pt seeing Dr. Pearlean Brownie in the past. Thanks.   Discharge patient   Complete by: As directed    Discharge disposition: 01-Home or Self Care   Discharge patient date: 09/06/2023      Allergies as of 09/06/2023       Reactions   Demerol Other (See Comments)   "I almost died"   Morphine And Codeine Other (See Comments)   "Almost Died"        Medication List     TAKE these medications    albuterol 108 (90 Base) MCG/ACT inhaler Commonly known as: VENTOLIN HFA Inhale 1-2 puffs into the lungs every 6 (six) hours as needed for wheezing or shortness of breath.   amLODipine 10 MG tablet Commonly known as: NORVASC TAKE 1 TABLET DAILY TO LOWER BLOOD PRESSURE   cetirizine 10 MG tablet Commonly known as: ZYRTEC Take 1 tablet (10 mg total) by mouth daily.   citalopram 10 MG tablet Commonly known as: CELEXA Take 1 tablet (10 mg total) by mouth daily.   fluticasone  50 MCG/ACT nasal spray Commonly known as: FLONASE Place 1 spray into both nostrils daily. What changed:  when to take this reasons to take this   gabapentin 300 MG capsule Commonly known as: NEURONTIN Take 1 capsule (300 mg total) by mouth 3 (three) times daily.   levETIRAcetam 500 MG tablet Commonly known as: KEPPRA Take 1 tablet (500 mg total) by mouth 2 (two) times daily.   OneTouch  Delica Lancets 33G Misc Test blood sugars twice daily. E11.9   OneTouch Delica Plus Lancing Misc Use to check blood sugar once daily. E11.9   OneTouch Verio test strip Generic drug: glucose blood Use to check blood sugar once daily. E11.9   OneTouch Verio w/Device Kit Use to test blood sugar once daily. E11.9   trolamine salicylate 10 % cream Commonly known as: ASPERCREME Apply 1 application topically as needed for muscle pain (or sciatica).        Follow-up Information     Micki Riley, MD. Schedule an appointment as soon as possible for a visit in 2 week(s).   Specialties: Neurology, Radiology Why: stroke clinic Contact information: [address] [place] [address] [phone]                 Subjective   Pt reports feeling back to her normal baseline. Has no SOB, CP, weakness.   All questions and concerns were addressed at time of discharge.  Objective  Blood pressure (!) 141/58, pulse 69, temperature 98.5 F (36.9 C), temperature source Oral, resp. rate 20, height 4\' 11"  (1.499 m), weight 70 kg, SpO2 97%.   General: Pt is alert, awake, not in acute distress Cardiovascular: RRR, S1/S2 +, no rubs, no gallops Respiratory: CTA bilaterally, no wheezing, no rhonchi Abdominal: Soft, NT, ND, bowel sounds + Extremities: no edema, no cyanosis  The results of significant diagnostics from this hospitalization (including imaging, microbiology, ancillary and laboratory) are listed below for reference.   Imaging studies: EEG adult  Result Date: 2023/09/25 Charlsie Quest, MD     25-Sep-2023  8:51 AM Patient Name: Renee Murray MRN: [id number] Epilepsy Attending: Charlsie Quest Referring Physician/Provider: Caryl Pina, MD Date: 25-Sep-2023 Duration: 32.29 mins Patient history: 67yo F with left face, arm numbness and arm weakness getting eeg to evaluate for seizure Level of alertness: Awake, asleep AEDs during EEG study: LEV, GBP Technical  aspects: This EEG study was done with scalp electrodes positioned according to the 10-20 International system of electrode placement. Electrical activity was reviewed with band pass filter of 1-70Hz , sensitivity of 7 uV/mm, display speed of 48mm/sec with a 60Hz  notched filter applied as appropriate. EEG data were recorded continuously and digitally stored.  Video monitoring was available and reviewed as appropriate. Description: The posterior dominant rhythm consists of 8-9 Hz activity of moderate voltage (25-35 uV) seen predominantly in posterior head regions, symmetric and reactive to eye opening and eye closing. Sleep was characterized by vertex waves, sleep spindles (12 to 14 Hz), maximal frontocentral region. Hyperventilation and photic stimulation were not performed.   IMPRESSION: This study is within normal limits. No seizures or epileptiform discharges were seen throughout the recording. A normal interictal EEG does not exclude the diagnosis of epilepsy. Charlsie Quest   ECHOCARDIOGRAM COMPLETE  Result Date: 09/05/2023    ECHOCARDIOGRAM REPORT   Patient Name:   DYNASTY MEES Date of Exam: 09/05/2023 Medical Rec #:  [id number]        Height:  59.0 in Accession #:    [id number]       Weight:       154.3 lb Date of Birth:  Jul 20, 1956         BSA:          1.652 m Patient Age:    67 years         BP:           161/72 mmHg Patient Gender: F                HR:           70 bpm. Exam Location:  Inpatient Procedure: 2D Echo, Cardiac Doppler and Color Doppler Indications:    TIA  History:        Patient has no prior history of Echocardiogram examinations.  Sonographer:    Darlys Gales Referring Phys: [id number] RONDELL A SMITH IMPRESSIONS  1. Left ventricular ejection fraction, by estimation, is 60 to 65%. The left ventricle has normal function. The left ventricle has no regional wall motion abnormalities. Left ventricular diastolic parameters are consistent with Grade I diastolic dysfunction (impaired  relaxation).  2. Right ventricular systolic function is normal. The right ventricular size is normal. There is normal pulmonary artery systolic pressure. The estimated right ventricular systolic pressure is 29.6 mmHg.  3. Left atrial size was moderately dilated.  4. The mitral valve is normal in structure. Trivial mitral valve regurgitation.  5. The aortic valve is tricuspid. There is mild calcification of the aortic valve. There is mild thickening of the aortic valve. Aortic valve regurgitation is mild. Aortic valve sclerosis/calcification is present, without any evidence of aortic stenosis.  6. The inferior vena cava is normal in size with greater than 50% respiratory variability, suggesting right atrial pressure of 3 mmHg. FINDINGS  Left Ventricle: Left ventricular ejection fraction, by estimation, is 60 to 65%. The left ventricle has normal function. The left ventricle has no regional wall motion abnormalities. The left ventricular internal cavity size was normal in size. There is  no left ventricular hypertrophy. Left ventricular diastolic parameters are consistent with Grade I diastolic dysfunction (impaired relaxation). Normal left ventricular filling pressure. Right Ventricle: The right ventricular size is normal. No increase in right ventricular wall thickness. Right ventricular systolic function is normal. There is normal pulmonary artery systolic pressure. The tricuspid regurgitant velocity is 2.58 m/s, and  with an assumed right atrial pressure of 3 mmHg, the estimated right ventricular systolic pressure is 29.6 mmHg. Left Atrium: Left atrial size was moderately dilated. Right Atrium: Right atrial size was normal in size. Pericardium: There is no evidence of pericardial effusion. Presence of epicardial fat layer. Mitral Valve: The mitral valve is normal in structure. Trivial mitral valve regurgitation. Tricuspid Valve: The tricuspid valve is normal in structure. Tricuspid valve regurgitation is trivial.  Aortic Valve: The aortic valve is tricuspid. There is mild calcification of the aortic valve. There is mild thickening of the aortic valve. Aortic valve regurgitation is mild. Aortic regurgitation PHT measures 669 msec. Aortic valve sclerosis/calcification is present, without any evidence of aortic stenosis. Aortic valve mean gradient measures 7.0 mmHg. Aortic valve peak gradient measures 12.7 mmHg. Aortic valve area, by VTI measures 2.31 cm. Pulmonic Valve: The pulmonic valve was not well visualized. Pulmonic valve regurgitation is not visualized. No evidence of pulmonic stenosis. Aorta: The aortic root and ascending aorta are structurally normal, with no evidence of dilitation. Venous: The inferior vena cava is normal in size with greater than  50% respiratory variability, suggesting right atrial pressure of 3 mmHg. IAS/Shunts: No atrial level shunt detected by color flow Doppler.  LEFT VENTRICLE PLAX 2D LVIDd:         3.50 cm   Diastology LVIDs:         2.00 cm   LV e' medial:    11.50 cm/s LV PW:         0.80 cm   LV E/e' medial:  5.8 LV IVS:        1.10 cm   LV e' lateral:   10.00 cm/s LVOT diam:     1.85 cm   LV E/e' lateral: 6.7 LV SV:         90 LV SV Index:   54 LVOT Area:     2.69 cm  RIGHT VENTRICLE RV S prime:     13.50 cm/s TAPSE (M-mode): 2.3 cm LEFT ATRIUM             Index        RIGHT ATRIUM           Index LA Vol (A2C):   57.4 ml 34.75 ml/m  RA Area:     10.30 cm LA Vol (A4C):   60.6 ml 36.68 ml/m  RA Volume:   18.40 ml  11.14 ml/m LA Biplane Vol: 60.0 ml 36.32 ml/m  AORTIC VALVE AV Area (Vmax):    2.23 cm AV Area (Vmean):   2.24 cm AV Area (VTI):     2.31 cm AV Vmax:           178.00 cm/s AV Vmean:          125.000 cm/s AV VTI:            0.387 m AV Peak Grad:      12.7 mmHg AV Mean Grad:      7.0 mmHg LVOT Vmax:         148.00 cm/s LVOT Vmean:        104.000 cm/s LVOT VTI:          0.333 m LVOT/AV VTI ratio: 0.86 AI PHT:            669 msec  AORTA Ao Asc diam: 2.90 cm MITRAL VALVE                 TRICUSPID VALVE MV Area (PHT): 3.99 cm     TR Peak grad:   26.6 mmHg MV Decel Time: 190 msec     TR Vmax:        258.00 cm/s MV E velocity: 66.60 cm/s MV A velocity: 103.00 cm/s  SHUNTS MV E/A ratio:  0.65         Systemic VTI:  0.33 m                             Systemic Diam: 1.85 cm Rachelle Hora Croitoru MD Electronically signed by Thurmon Fair MD Signature Date/Time: 09/05/2023/4:53:12 PM    Final    MR BRAIN WO CONTRAST  Result Date: 09/05/2023 CLINICAL DATA:  Stroke, follow up; Cervical radiculopathy, no red flags EXAM: MRI HEAD WITHOUT CONTRAST MRI CERVICAL SPINE WITHOUT CONTRAST TECHNIQUE: Multiplanar, multiecho pulse sequences of the brain and surrounding structures, and cervical spine, to include the craniocervical junction and cervicothoracic junction, were obtained without intravenous contrast. COMPARISON:  None Available. FINDINGS: MRI HEAD FINDINGS Brain: Encephalomalacia in the right temporal lobe with evidence of prior hemorrhage in this  region. No acute infarction, acute hemorrhage, hydrocephalus, extra-axial collection or mass lesion. Small remote left cerebellar infarcts. Vascular: Right MCA territory aneurysm clip. Skull and upper cervical spine: Normal marrow signal. Sinuses/Orbits: Clear sinuses.  No acute orbital findings. Other: No mastoid effusions. MRI CERVICAL SPINE FINDINGS Alignment: Straightening.  No substantial sagittal subluxation. Vertebrae: No fracture, evidence of discitis, or bone lesion. Cord: Normal cord signal. Posterior Fossa, vertebral arteries, paraspinal tissues: Visualized vertebral artery flow voids are maintained. Disc levels: C2-C3: Facet arthropathy.  No significant stenosis. C3-C4: Motion limited. Left greater than right facet and uncovertebral hypertrophy. Resulting moderate to severe left and mild right foraminal stenosis. Mild canal stenosis. C4-C5: Bilateral facet and uncovertebral hypertrophy. Mild left foraminal stenosis. C5-C6: Posterior disc osteophyte  complex with bilateral facet and uncovertebral hypertrophy. Resulting moderate right and mild left foraminal stenosis with mild canal stenosis. C6-C7: Bilateral facet uncovertebral hypertrophy. Mild bilateral foraminal stenosis. Mild canal stenosis. C7-T1: Facet uncovertebral hypertrophy.  No significant stenosis. IMPRESSION: MRI head: 1. No evidence of acute intracranial abnormality. 2. Right temporal encephalomalacia. MRI cervical spine: 1. Motion limited study.  Within this limitation: 2. At C3-C4, suspected moderate to severe left foraminal stenosis with mild canal stenosis. 3. At C4-C5, suspected moderate to severe right foraminal stenosis and mild canal stenosis. 4. Additional multilevel mild canal and foraminal stenosis is detailed above. Electronically Signed   By: Feliberto Harts M.D.   On: 09/05/2023 15:57   MR CERVICAL SPINE WO CONTRAST  Result Date: 09/05/2023 CLINICAL DATA:  Stroke, follow up; Cervical radiculopathy, no red flags EXAM: MRI HEAD WITHOUT CONTRAST MRI CERVICAL SPINE WITHOUT CONTRAST TECHNIQUE: Multiplanar, multiecho pulse sequences of the brain and surrounding structures, and cervical spine, to include the craniocervical junction and cervicothoracic junction, were obtained without intravenous contrast. COMPARISON:  None Available. FINDINGS: MRI HEAD FINDINGS Brain: Encephalomalacia in the right temporal lobe with evidence of prior hemorrhage in this region. No acute infarction, acute hemorrhage, hydrocephalus, extra-axial collection or mass lesion. Small remote left cerebellar infarcts. Vascular: Right MCA territory aneurysm clip. Skull and upper cervical spine: Normal marrow signal. Sinuses/Orbits: Clear sinuses.  No acute orbital findings. Other: No mastoid effusions. MRI CERVICAL SPINE FINDINGS Alignment: Straightening.  No substantial sagittal subluxation. Vertebrae: No fracture, evidence of discitis, or bone lesion. Cord: Normal cord signal. Posterior Fossa, vertebral arteries,  paraspinal tissues: Visualized vertebral artery flow voids are maintained. Disc levels: C2-C3: Facet arthropathy.  No significant stenosis. C3-C4: Motion limited. Left greater than right facet and uncovertebral hypertrophy. Resulting moderate to severe left and mild right foraminal stenosis. Mild canal stenosis. C4-C5: Bilateral facet and uncovertebral hypertrophy. Mild left foraminal stenosis. C5-C6: Posterior disc osteophyte complex with bilateral facet and uncovertebral hypertrophy. Resulting moderate right and mild left foraminal stenosis with mild canal stenosis. C6-C7: Bilateral facet uncovertebral hypertrophy. Mild bilateral foraminal stenosis. Mild canal stenosis. C7-T1: Facet uncovertebral hypertrophy.  No significant stenosis. IMPRESSION: MRI head: 1. No evidence of acute intracranial abnormality. 2. Right temporal encephalomalacia. MRI cervical spine: 1. Motion limited study.  Within this limitation: 2. At C3-C4, suspected moderate to severe left foraminal stenosis with mild canal stenosis. 3. At C4-C5, suspected moderate to severe right foraminal stenosis and mild canal stenosis. 4. Additional multilevel mild canal and foraminal stenosis is detailed above. Electronically Signed   By: Feliberto Harts M.D.   On: 09/05/2023 15:57   DG CHEST PORT 1 VIEW  Result Date: 09/05/2023 CLINICAL DATA:  42595 TIA (transient ischemic attack) 63875 EXAM: PORTABLE CHEST 1 VIEW COMPARISON:  None Available.  FINDINGS: No pleural effusion. No pneumothorax. Normal cardiac and mediastinal contours. Bibasilar atelectasis. No radiographically apparent displaced rib fractures. IMPRESSION: Bibasilar atelectasis. Electronically Signed   By: Lorenza Cambridge M.D.   On: 09/05/2023 08:42   CT ANGIO HEAD NECK W WO CM  Result Date: 09/05/2023 CLINICAL DATA:  Abnormal sensation and jaw and neck EXAM: CT ANGIOGRAPHY HEAD AND NECK WITH AND WITHOUT CONTRAST TECHNIQUE: Multidetector CT imaging of the head and neck was performed using  the standard protocol during bolus administration of intravenous contrast. Multiplanar CT image reconstructions and MIPs were obtained to evaluate the vascular anatomy. Carotid stenosis measurements (when applicable) are obtained utilizing NASCET criteria, using the distal internal carotid diameter as the denominator. RADIATION DOSE REDUCTION: This exam was performed according to the departmental dose-optimization program which includes automated exposure control, adjustment of the mA and/or kV according to patient size and/or use of iterative reconstruction technique. CONTRAST:  75mL OMNIPAQUE IOHEXOL 350 MG/ML SOLN COMPARISON:  CTA head/neck 07/22/2020 FINDINGS: CTA NECK FINDINGS Aortic arch: There is mild calcified plaque in the imaged aortic arch. The origins of the major branch vessels are patent the subclavian arteries are patent to the level imaged. Right carotid system: The right common, internal, and external carotid arteries are patent, without hemodynamically significant stenosis or occlusion. There is unchanged tortuosity and mild irregularity of the internal carotid artery. There is no evidence of dissection or aneurysm. Left carotid system: The left common, internal, and external carotid arteries are patent, without hemodynamically significant stenosis or occlusion. There is unchanged tortuosity and mild irregularity of the internal carotid artery there is no evidence of dissection or aneurysm. Vertebral arteries: The vertebral arteries are patent, without hemodynamically significant stenosis or occlusion. There is no evidence of dissection or aneurysm. Skeleton: There is no acute osseous abnormality or suspicious osseous lesion there is no visible canal hematoma. Other neck: The soft tissues of the neck are unremarkable. Upper chest: There is emphysema in the lung apices. Review of the MIP images confirms the above findings CTA HEAD FINDINGS Anterior circulation: The intracranial ICAs are patent,  without significant stenosis or occlusion. 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 Streak artifact from the clip degrades evaluation of the surrounding vasculature. Within this confine, the right MCA branches appear patent, without proximal stenosis or occlusion. The left MCA is patent, without proximal stenosis or occlusion. The bilateral ACAS are patent, without proximal stenosis or occlusion. There is a common A2 trunk, a normal variant. There is no definite recurrent aneurysm of the right MCA bifurcation. There is no new aneurysm or AVM. Posterior circulation: The bilateral V4 segments are patent. The basilar artery is patent. The major cerebellar arteries appear patent. The bilateral PCAs are patent, without proximal stenosis or occlusion. Diminutive posterior communicating arteries are identified. There is no aneurysm or AVM. Venous sinuses: Patent. Anatomic variants: As above. Review of the MIP images confirms the above findings IMPRESSION: 1. Patent vasculature of the head and neck with no hemodynamically significant stenosis or occlusion. 2. Status post clipping of the right MCA bifurcation aneurysm without definite evidence of recurrent aneurysm. 3. Emphysema. Electronically Signed   By: Lesia Hausen M.D.   On: 09/05/2023 08:34   CT HEAD WO CONTRAST  Result Date: 09/05/2023 CLINICAL DATA:  Weird sensation in left jaw and arm EXAM: CT HEAD WITHOUT CONTRAST TECHNIQUE: Contiguous axial images were obtained from the base of the skull through the vertex without intravenous contrast. RADIATION DOSE REDUCTION: This exam  was performed according to the departmental dose-optimization program which includes automated exposure control, adjustment of the mA and/or kV according to patient size and/or use of iterative reconstruction technique. COMPARISON:  12/06/2020 FINDINGS: Brain: No evidence of acute infarction, hemorrhage, mass, mass effect, or midline shift. No hydrocephalus  or extra-axial fluid collection. Redemonstrated chronic encephalomalacia in the right frontal and temporal lobe Vascular: No hyperdense vessel.  Right MCA territory aneurysm clip. Skull: Negative for fracture or focal lesion. Prior right frontal craniotomy. Sinuses/Orbits: No acute finding. Other: The mastoid air cells are well aerated. IMPRESSION: No acute intracranial process. Electronically Signed   By: Wiliam Ke M.D.   On: 09/05/2023 02:45    Labs: Basic Metabolic Panel: Recent Labs  Lab 09/05/23 0445  NA 141  K 4.2  CL 105  CO2 25  GLUCOSE 98  BUN 9  CREATININE 0.64  CALCIUM 8.9  MG 2.0   CBC: Recent Labs  Lab 09/05/23 0156  WBC 6.7  NEUTROABS 2.3  HGB 13.3  HCT 41.2  MCV 91.4  PLT 76*   Microbiology: Results for orders placed or performed during the hospital encounter of 08/28/23  Urine Culture     Status: None   Collection Time: 08/28/23  3:37 PM   Specimen: Urine, Random  Result Value Ref Range Status   Specimen Description URINE, RANDOM  Final   Special Requests NONE  Final   Culture   Final    NO GROWTH Performed at [facility], [address] [place], [place] [address]    Report Status 08/29/2023 FINAL  Final   Time coordinating discharge: Over 30 minutes  Leeroy Bock, MD  [facility] 09/06/2023, 11:58 AM

## 2023-09-06 NOTE — Plan of Care (Signed)
  Problem: Education: Goal: Knowledge of disease or condition will improve Outcome: Progressing   Problem: Ischemic Stroke/TIA Tissue Perfusion: Goal: Complications of ischemic stroke/TIA will be minimized Outcome: Progressing   Problem: Coping: Goal: Will verbalize positive feelings about self Outcome: Progressing Goal: Will identify appropriate support needs Outcome: Progressing   Problem: Health Behavior/Discharge Planning: Goal: Ability to manage health-related needs will improve Outcome: Progressing   Problem: Self-Care: Goal: Ability to participate in self-care as condition permits will improve Outcome: Progressing   Problem: Education: Goal: Knowledge of General Education information will improve Description: Including pain rating scale, medication(s)/side effects and non-pharmacologic comfort measures Outcome: Progressing   Problem: Health Behavior/Discharge Planning: Goal: Ability to manage health-related needs will improve Outcome: Progressing

## 2023-09-06 NOTE — Discharge Instructions (Signed)
Your scans came back clear of stroke signs.  The neurologist believes your symptoms are due to seizures. Please take your new medication, Keppra twice daily to prevent further seizures.  You can follow up with your neurologist in 2 weeks outpatient

## 2023-09-06 NOTE — Progress Notes (Signed)
EEG reviewed with preliminary findings of diffuse slowing that waxes and wanes in amplitude with a nearly constant frequency of approximately 8 Hz in the right and left sided leads. Findings are suggestive of a diffuse encephalopathy. She is drowsy during the EEG procedure, but can follow commands and answer some questions.   Electronically signed: Dr. Caryl Pina

## 2023-09-06 NOTE — TOC Initial Note (Signed)
Transition of Care [facility]) - Initial/Assessment Note    Patient Details  Name: Renee Murray MRN: [id number] Date of Birth: 18-Jul-1956  Transition of Care [facility]) CM/SW Contact:    Kermit Balo, RN Phone Number: 09/06/2023, 11:01 AM  Clinical Narrative:                 Pt is from home alone. She says her grandson and daughter can check in on her.  She also says she can get [facility] restarted and they check in on her.  Has a walker at home but doesn't actively use it. She drives herself as needed.  She manages her own medications and denies any issues.  Awaiting therapy evals.  TOC following.  Expected Discharge Plan: Home/Self Care Barriers to Discharge: Continued Medical Work up   Patient Goals and CMS Choice            Expected Discharge Plan and Services   Discharge Planning Services: CM Consult   Living arrangements for the past 2 months: Single Family Home                                      Prior Living Arrangements/Services Living arrangements for the past 2 months: Single Family Home Lives with:: Self Patient language and need for interpreter reviewed:: Yes Do you feel safe going back to the place where you live?: Yes        Care giver support system in place?: No (comment)   Criminal Activity/Legal Involvement Pertinent to Current Situation/Hospitalization: No - Comment as needed  Activities of Daily Living Home Assistive Devices/Equipment: None ADL Screening (condition at time of admission) Patient's cognitive ability adequate to safely complete daily activities?: Yes Is the patient deaf or have difficulty hearing?: No Does the patient have difficulty seeing, even when wearing glasses/contacts?: No Does the patient have difficulty concentrating, remembering, or making decisions?: No Patient able to express need for assistance with ADLs?: Yes Does the patient have difficulty dressing or bathing?: No Independently performs ADLs?: Yes  (appropriate for developmental age) Does the patient have difficulty walking or climbing stairs?: No Weakness of Legs: None Weakness of Arms/Hands: None  Permission Sought/Granted                  Emotional Assessment Appearance:: Appears stated age Attitude/Demeanor/Rapport: Engaged Affect (typically observed): Accepting Orientation: : Oriented to Self, Oriented to Place, Oriented to  Time, Oriented to Situation   Psych Involvement: No (comment)  Admission diagnosis:  TIA (transient ischemic attack) [G45.9] Patient Active Problem List   Diagnosis Date Noted   TIA (transient ischemic attack) 09/05/2023   Thrombocytopenia (HCC) 09/05/2023   Brain aneurysm 09/05/2023   Obesity (BMI 30-39.9) 09/05/2023   Positive colorectal cancer screening using Cologuard test 11/18/2021   Type 2 diabetes mellitus without complication, without long-term current use of insulin (HCC) 07/19/2021   Cellulitis of left lower extremity 06/01/2021   Acute back pain with sciatica, right 05/31/2021   Dermatitis 05/31/2021   Right sciatic nerve pain 05/30/2021   Rash and nonspecific skin eruption 05/30/2021   Acute midline low back pain 12/27/2016   Acute pain of both shoulders 12/27/2016   Rotator cuff impingement syndrome 12/27/2016   Essential hypertension 01/25/2016   Chronic pain syndrome 01/25/2016   Prediabetes 10/13/2015   Anxiety about health 09/13/2015   Neuropathy 09/13/2015   Chronic generalized pain 09/13/2015   History  of seasonal allergies 09/13/2015   SAH (subarachnoid hemorrhage) (HCC)    Subarachnoid hemorrhage (HCC) 02/04/2015   History of sinus disease on CT 08/10/2014   Hypertension, goal below 150/90 03/17/2014   Health care maintenance 03/17/2014   Tobacco abuse 03/17/2014   Neuropathy, upper extremity bilaterally  08/06/2013   PCP:  Marcine Matar, MD Pharmacy:   [facility] [address] [place] [address] [facility] [address] [place] [address] Phone: [phone] Fax: [phone]  [facility] - [place], [place] - [address] [place] [address] Phone: [phone] Fax: [phone]  [facility] - [facility] [address] [place] [address] Phone: [phone] Fax: [phone]     Social Determinants of Health (SDOH) Social History: SDOH Screenings   Food Insecurity: No Food Insecurity (09/05/2023)  Housing: Low Risk  (09/05/2023)  Transportation Needs: No Transportation Needs (09/05/2023)  Utilities: Not At Risk (09/05/2023)  Depression (PHQ2-9): Low Risk  (03/08/2023)  Recent Concern: Depression (PHQ2-9) - Medium Risk (03/05/2023)  Tobacco Use: High Risk (09/05/2023)   SDOH Interventions:     Readmission Risk Interventions     No data to display

## 2023-09-06 NOTE — Plan of Care (Signed)
  Problem: Education: Goal: Knowledge of disease or condition will improve Outcome: Adequate for Discharge Goal: Knowledge of secondary prevention will improve (MUST DOCUMENT ALL) Outcome: Adequate for Discharge Goal: Knowledge of patient specific risk factors will improve Loraine Leriche N/A or DELETE if not current risk factor) Outcome: Adequate for Discharge   Problem: Ischemic Stroke/TIA Tissue Perfusion: Goal: Complications of ischemic stroke/TIA will be minimized Outcome: Adequate for Discharge   Problem: Coping: Goal: Will verbalize positive feelings about self Outcome: Adequate for Discharge Goal: Will identify appropriate support needs Outcome: Adequate for Discharge   Problem: Health Behavior/Discharge Planning: Goal: Ability to manage health-related needs will improve Outcome: Adequate for Discharge Goal: Goals will be collaboratively established with patient/family Outcome: Adequate for Discharge   Problem: Self-Care: Goal: Ability to participate in self-care as condition permits will improve Outcome: Adequate for Discharge Goal: Verbalization of feelings and concerns over difficulty with self-care will improve Outcome: Adequate for Discharge Goal: Ability to communicate needs accurately will improve Outcome: Adequate for Discharge   Problem: Nutrition: Goal: Risk of aspiration will decrease Outcome: Adequate for Discharge Goal: Dietary intake will improve Outcome: Adequate for Discharge   Problem: Education: Goal: Knowledge of General Education information will improve Description: Including pain rating scale, medication(s)/side effects and non-pharmacologic comfort measures Outcome: Adequate for Discharge   Problem: Health Behavior/Discharge Planning: Goal: Ability to manage health-related needs will improve Outcome: Adequate for Discharge   Problem: Clinical Measurements: Goal: Ability to maintain clinical measurements within normal limits will improve Outcome:  Adequate for Discharge Goal: Will remain free from infection Outcome: Adequate for Discharge Goal: Diagnostic test results will improve Outcome: Adequate for Discharge Goal: Respiratory complications will improve Outcome: Adequate for Discharge Goal: Cardiovascular complication will be avoided Outcome: Adequate for Discharge   Problem: Activity: Goal: Risk for activity intolerance will decrease Outcome: Adequate for Discharge   Problem: Nutrition: Goal: Adequate nutrition will be maintained Outcome: Adequate for Discharge   Problem: Coping: Goal: Level of anxiety will decrease Outcome: Adequate for Discharge   Problem: Elimination: Goal: Will not experience complications related to bowel motility Outcome: Adequate for Discharge Goal: Will not experience complications related to urinary retention Outcome: Adequate for Discharge   Problem: Pain Managment: Goal: General experience of comfort will improve Outcome: Adequate for Discharge   Problem: Safety: Goal: Ability to remain free from injury will improve Outcome: Adequate for Discharge   Problem: Skin Integrity: Goal: Risk for impaired skin integrity will decrease Outcome: Adequate for Discharge

## 2023-09-10 ENCOUNTER — Telehealth: Payer: Self-pay

## 2023-09-10 NOTE — Transitions of Care (Post Inpatient/ED Visit) (Signed)
09/10/2023  Name: Renee Murray MRN: [id number] DOB: 1956-06-11  Today's TOC FU Call Status: Today's TOC FU Call Status:: Unsuccessful Call (1st Attempt) Unsuccessful Call (1st Attempt) Date: 09/10/23  Attempted to reach the patient regarding the most recent Inpatient/ED visit.  Follow Up Plan: Additional outreach attempts will be made to reach the patient to complete the Transitions of Care (Post Inpatient/ED visit) call.   Signature Robyne Peers, RN

## 2023-09-11 ENCOUNTER — Telehealth: Payer: Self-pay

## 2023-09-11 NOTE — Transitions of Care (Post Inpatient/ED Visit) (Signed)
09/11/2023  Name: Renee Murray MRN: [id number] DOB: September 04, 1956  Today's TOC FU Call Status: Today's TOC FU Call Status:: Successful TOC FU Call Completed Unsuccessful Call (1st Attempt) Date: 09/10/23 Renee Murray [facility] FU Call Complete Date: 09/11/23 Patient's Name and Date of Birth confirmed.  Transition Care Management Follow-up Telephone Call Date of Discharge: 09/06/23 Discharge Facility: [facility]) Type of Discharge: Inpatient Admission Primary Inpatient Discharge Diagnosis:: TIA How have you been since you were released from the hospital?: Better (She is drowsy from the keppra and is taking her time with ADLs. She is anxious to speak with the neurologist. She said she recalls having pain in her neck  jaw and shoulder in the past  and would like to know if that was related to the TIA.) Any questions or concerns?: Yes Patient Questions/Concerns Addressed: Other: (patient will address her concerns with neurologist at appointment 09/18/2023.  She also stated that she needs surgery for her carpal tunnel but must speak with the neurologist first.)  Items Reviewed: Did you receive and understand the discharge instructions provided?: Yes Medications obtained,verified, and reconciled?: Yes (Medications Reviewed) Reason for Partial Mediation Review: She said she has all medications and did not have any questions about the med regime.  She also has a home BP monitor and glucometer that she uses regularly Any new allergies since your discharge?: No Dietary orders reviewed?: Yes Type of Diet Ordered:: heart healthy, carb modified. Do you have support at home?: Yes People in Home: alone Name of Support/Comfort Primary Source: lives alone but her children check on her regularly  Medications Reviewed Today: Medications Reviewed Today     Reviewed by Robyne Peers, RN (Case Manager) on 09/11/23 at 1031  Med List Status: <None>   Medication Order Taking? Sig Documenting Provider Last Dose Status  Informant  albuterol (VENTOLIN HFA) 108 (90 Base) MCG/ACT inhaler [id number] No Inhale 1-2 puffs into the lungs every 6 (six) hours as needed for wheezing or shortness of breath. Raspet, Noberto Retort, PA-C Past Month Active Self  amLODipine (NORVASC) 10 MG tablet [id number] No TAKE 1 TABLET DAILY TO LOWER BLOOD PRESSURE Marcine Matar, MD 09/04/2023 am Active Self  Blood Glucose Monitoring Suppl (ONETOUCH VERIO) w/Device KIT [id number] No Use to test blood sugar once daily. E11.9 Fulp, Cammie, MD Taking Active Self  cetirizine (ZYRTEC) 10 MG tablet [id number] No Take 1 tablet (10 mg total) by mouth daily. Anders Simmonds, [place] 09/04/2023 Active Self  citalopram (CELEXA) 10 MG tablet [id number] No Take 1 tablet (10 mg total) by mouth daily. Marcine Matar, MD 09/04/2023 am Active Self  fluticasone (FLONASE) 50 MCG/ACT nasal spray [id number] No Place 1 spray into both nostrils daily.  Patient taking differently: Place 1 spray into both nostrils daily as needed for allergies.   Raspet, Noberto Retort, PA-C Past Week Active Self  gabapentin (NEURONTIN) 300 MG capsule [id number] No Take 1 capsule (300 mg total) by mouth 3 (three) times daily. Anders Simmonds, PA-C 09/04/2023 pm Active Self  glucose blood (ONETOUCH VERIO) test strip [id number] No Use to check blood sugar once daily. E11.9 Cain Saupe, MD Taking Active Self  Lancet Devices [facility] [place] [facility] PLUS LANCING) MISC [id number] No Use to check blood sugar once daily. E11.9 Fulp, Cammie, MD Taking Active Self  levETIRAcetam (KEPPRA) 500 MG tablet [id number]  Take 1 tablet (500 mg total) by mouth 2 (two) times daily. Leeroy Bock, MD  Active   OneTouch Delica Lancets 33G [place] [id number] No Test blood sugars  twice daily. E11.9 Fulp, Cammie, MD Taking Active Self  trolamine salicylate (ASPERCREME) 10 % cream [id number] No Apply 1 application topically as needed for muscle pain (or sciatica).  [provider] Past Week Active Self            Home  Care and Equipment/Supplies: Were Home Health Services Ordered?: No Any new equipment or medical supplies ordered?: Yes Name of Medical supply agency?: [employer] Were you able to get the equipment/medical supplies?: Yes Do you have any questions related to the use of the equipment/supplies?: No  Functional Questionnaire: Do you need assistance with bathing/showering or dressing?: No Do you need assistance with meal preparation?: No Do you need assistance with eating?: No Do you have difficulty maintaining continence: No Do you need assistance with getting out of bed/getting out of a chair/moving?: Yes (ambulates with cane) Do you have difficulty managing or taking your medications?: No  Follow up appointments reviewed: PCP Follow-up appointment confirmed?: No MD Provider Line Number:[phone] Given: No (She said she wants to see neurology first and will then call [facility] to schedule a follow up appointment) Specialist Hospital Follow-up appointment confirmed?: Yes Date of Specialist follow-up appointment?: 09/18/23 Follow-Up Specialty Provider:: neurology Do you need transportation to your follow-up appointment?: No Do you understand care options if your condition(s) worsen?: Yes-patient verbalized understanding    SIGNATURE Robyne Peers, RN

## 2023-09-11 NOTE — Telephone Encounter (Signed)
Dr Laural Benes :  From the [facility] call.  The patient wanted to make sure that I let you know that she is doing well.  She has all of her medications and is taking them as ordered. She also said she is  keeping up with checking BP and blood sugars.  She stated that "nothing is out of the ordinary" and she will call for an appointment with Dr Laural Benes after she sees the neurologist next week.

## 2023-09-12 ENCOUNTER — Telehealth (INDEPENDENT_AMBULATORY_CARE_PROVIDER_SITE_OTHER): Payer: Self-pay | Admitting: Internal Medicine

## 2023-09-12 NOTE — Telephone Encounter (Signed)
Copied from CRM [id number]. Topic: General - Other >> Sep 10, 2023 12:41 PM Ja-Kwan M wrote: Reason for CRM: Pt stated she was returning a call that she received from the nurse case manager.

## 2023-09-13 NOTE — Telephone Encounter (Signed)
Call returned to patient. Message left stating that I have already spoken to her since I left the message for her. She does not need to call back unless she has questions/concerns.

## 2023-09-18 ENCOUNTER — Telehealth: Payer: Self-pay | Admitting: Neurology

## 2023-09-18 ENCOUNTER — Encounter: Payer: Self-pay | Admitting: Neurology

## 2023-09-18 ENCOUNTER — Ambulatory Visit: Payer: Medicare (Managed Care) | Admitting: Neurology

## 2023-09-18 VITALS — BP 128/76 | HR 69 | Ht <= 58 in | Wt 154.0 lb

## 2023-09-18 DIAGNOSIS — G40009 Localization-related (focal) (partial) idiopathic epilepsy and epileptic syndromes with seizures of localized onset, not intractable, without status epilepticus: Secondary | ICD-10-CM

## 2023-09-18 DIAGNOSIS — M542 Cervicalgia: Secondary | ICD-10-CM

## 2023-09-18 DIAGNOSIS — G5602 Carpal tunnel syndrome, left upper limb: Secondary | ICD-10-CM | POA: Diagnosis not present

## 2023-09-18 DIAGNOSIS — Z9889 Other specified postprocedural states: Secondary | ICD-10-CM

## 2023-09-18 MED ORDER — LEVETIRACETAM 250 MG PO TABS: 250.0000 mg | ORAL_TABLET | Freq: Two times a day (BID) | ORAL | 5 refills | Status: DC

## 2023-09-18 NOTE — Progress Notes (Signed)
[facility] [address] [place]. [place] [address] [phone]       OFFICE CONSULT NOTE  Renee Murray Date of Birth:  Feb 20, 1956 Medical Record Number:  [id number]   Referring MD:  Marvel Plan  Reason for Referral: Seizure  HPI: Renee Murray is a 67 year old pleasant African-American lady seen today for initial office consultation visit.  History is obtained from the patient and review of electronic medical records.  I personally reviewed pertinent available imaging films in PACS.  She has past medical history of diabetes, hypertension, TIA-like symptoms, history of subarachnoid hemorrhage with craniotomy for right anterior communicating artery surgical clipping in 2016.  The patient presented the emergency room on 09/05/2023 with sudden onset of pain and weird sensation in the left jaw, neck and arm with the left arm feeling heavy and weak.  This feeling subsided by the time she came to the ER but then it recurred and again resolved within 10 minutes.  Patient states that earlier this year that she had somewhat similar episode which was milder and resolved after she sat down.  She had an MRI scan of the brain which showed encephalomalacia in the right temporal lobe without any acute abnormality.  MRI scan of the C-spine was motion limited but did show moderate to severe left-sided foraminal narrowing at C3-4 and to the right at C4-5 with additional multilevel mild canal and foraminal stenosis as well.  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  She states she had seen me previously in 2021 at that time she reported somewhat similar but milder episodes affecting right arm greater than left arm.  Workup at that time was also unremarkable.  Patient had additional workup including lab work on 09/05/2023 with LDL cholesterol 85 mg percent, hemoglobin A1c 6.4 and echocardiogram showing ejection fraction of 60 to 65% with moderate dilatation of left atrium.  EEG  was also done which was normal.  Urine drug screen was negative.  Patient has was started on Keppra for presumptive seizure prophylaxis however patient states she is not tolerating for 100 mg twice daily well and it makes her sleepy and she cannot function.  She has reduced her dose to 250 mg for last few days has been taking only once a day.  She does also take gabapentin 303 times daily for mild diabetic neuropathy symptoms.  She also has arthritic pain in her joints and hands which also seem to respond to gabapentin.  She does have a history of carpal tunnel and had surgery on the right hand but is now having symptoms in the left hand and thinking about having surgery for carpal tunnel as well. Prior office visit 09/22/2020 Renee Murray is a 67 year old African-American lady seen today for initial office consultation visit.  History is obtained from the patient, review of electronic medical records and available pertinent imaging films in PACS.  She has past medical history of diabetes, hypertension, anterior communicating artery aneurysm s/p surgical clipping in 2016 by Dr. Conchita Paris..  She states over the last 4 months she has had intermittent numbness, tingling, pain and weakness in the right upper extremity mostly and occasionally the left upper extremity but to a lesser extent.  This can come on suddenly without provocation.  She is notices when she is stretching her neck or arm in certain positions.  Feeling is quite severe and discomfort.  This may last for minutes but usually she managed to stretch her arm in such  a way that the feeling subsides.  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  She denies having EMG nerve conduction study done or been specifically diagnosed with carpal tunnel.  She is in the pain at times is mostly in the hands but can spread to the elbow and even beyond into her armpit.  This is mostly  burning and sometimes tingling but pain can be severe at times.  The pain often wakes her up from sleep and she has trouble sleeping at night.  She has to be careful and sleep in a certain position to avoid pressure on her shoulder which may trigger pain.  She does have mild arthritis in the shoulders.  She does take gabapentin 300 mg 3 times daily which is not helping as much.  She is not had any x-rays of her neck on MRI scan done.  She was seen in the ER on 07/22/2020 with sudden onset of electric pain in the left jaw which radiated down her neck into the left arm for a few seconds and then resolved.  She also noted some associated weakness in the arm which was also transient.  She was seen by Dr. Iver Nestle neuro hospitalist.  Patient reported that she had multiple such episodes in the past.  CT angiogram of brain and neck were performed which showed sequelae of prior right MCA aneurysm clipping without any residual aneurysm.  She was advised outpatient neurological work-up for this.  She denies any injury to her shoulder or upper extremities.  She has never worn wrist extension splint for carpal tunnel.  She denies any significant neck injury, radicular pain, hand weakness, gait or balance problems.  ROS:   14 system review of systems is positive for neck pain, paresthesias, weakness, decreased sensation, sleepiness, drowsiness, tingling all other systems negative  PMH:  Past Medical History:  Diagnosis Date   Brain aneurysm    Diabetes mellitus without complication (HCC)    Hypertension    Stroke (HCC)    stroke like symptons/admitted 8/21    Social History:  Social History   Socioeconomic History   Marital status: Divorced    Spouse name: Not on file   Number of children: 3   Years of education: Not on file   Highest education level: High school graduate  Occupational History   Occupation: retired  Tobacco Use   Smoking status: Some Days    Current packs/day: 0.00    Types: Cigarettes    Smokeless tobacco: Never   Tobacco comments:    reports 1 pack per month  Vaping Use   Vaping status: Never Used  Substance and Sexual Activity   Alcohol use: Yes    Comment: Rarely.   Drug use: No   Sexual activity: Not Currently  Other Topics Concern   Not on file  Social History Narrative   Lives alone   Right handed   Drinks 3-4 cups of caffeine daily   Social Determinants of Health   Financial Resource Strain: Not on file  Food Insecurity: No Food Insecurity (09/05/2023)   Hunger Vital Sign    Worried About Running Out of Food in the Last Year: Never true    Ran Out of Food in the Last Year: Never true  Transportation Needs: No Transportation Needs (09/05/2023)   PRAPARE - Transportation    Lack of Transportation (Medical): No    Lack  of Transportation (Non-Medical): No  Physical Activity: Not on file  Stress: Not on file  Social Connections: Not on file  Intimate Partner Violence: Not At Risk (09/05/2023)   Humiliation, Afraid, Rape, and Kick questionnaire    Fear of Current or Ex-Partner: No    Emotionally Abused: No    Physically Abused: No    Sexually Abused: No    Medications:   Current Outpatient Medications on File Prior to Visit  Medication Sig Dispense Refill   albuterol (VENTOLIN HFA) 108 (90 Base) MCG/ACT inhaler Inhale 1-2 puffs into the lungs every 6 (six) hours as needed for wheezing or shortness of breath. 18 g 0   amLODipine (NORVASC) 10 MG tablet TAKE 1 TABLET DAILY TO LOWER BLOOD PRESSURE 90 tablet 1   Blood Glucose Monitoring Suppl (ONETOUCH VERIO) w/Device KIT Use to test blood sugar once daily. E11.9 1 kit 0   cetirizine (ZYRTEC) 10 MG tablet Take 1 tablet (10 mg total) by mouth daily. 90 tablet 1   citalopram (CELEXA) 10 MG tablet Take 1 tablet (10 mg total) by mouth daily. 90 tablet 1   fluticasone (FLONASE) 50 MCG/ACT nasal spray Place 1 spray into both nostrils daily. (Patient taking differently: Place 1 spray into both nostrils daily as  needed for allergies.) 16 g 0   gabapentin (NEURONTIN) 300 MG capsule Take 1 capsule (300 mg total) by mouth 3 (three) times daily. 270 capsule 1   glucose blood (ONETOUCH VERIO) test strip Use to check blood sugar once daily. E11.9 100 each 6   Lancet Devices (ONETOUCH DELICA PLUS LANCING) MISC Use to check blood sugar once daily. E11.9 1 each 6   OneTouch Delica Lancets 33G MISC Test blood sugars twice daily. E11.9 100 each 11   trolamine salicylate (ASPERCREME) 10 % cream Apply 1 application topically as needed for muscle pain (or sciatica).      levETIRAcetam (KEPPRA) 500 MG tablet Take 1 tablet (500 mg total) by mouth 2 (two) times daily. (Patient not taking: Reported on 09/18/2023) 60 tablet 0   No current facility-administered medications on file prior to visit.    Allergies:   Allergies  Allergen Reactions   Demerol Other (See Comments)    "I almost died"   Morphine And Codeine Other (See Comments)    "Almost Died"    Physical Exam General: well developed, well nourished pleasant elderly African-American lady, seated, in no evident distress Head: head normocephalic and atraumatic.   Neck: supple with no carotid or supraclavicular bruits Cardiovascular: regular rate and rhythm, no murmurs Musculoskeletal: no deformity but muscle tightness in the posterior neck left more than right. Skin:  no rash/petichiae Vascular:  Normal pulses all extremities  Neurologic Exam Mental Status: Awake and fully alert. Oriented to place and time. Recent and remote memory intact. Attention span, concentration and fund of knowledge appropriate. Mood and affect appropriate.  Cranial Nerves: Fundoscopic exam reveals sharp disc margins. Pupils equal, briskly reactive to light. Extraocular movements full without nystagmus. Visual fields full to confrontation. Hearing intact. Facial sensation intact. Face, tongue, palate moves normally and symmetrically.  Motor: Normal bulk and tone. Normal strength in  all tested extremity muscles. Sensory.: intact to touch , pinprick , position and vibratory sensation.  Negative Tinel's sign over left wrist. Coordination: Rapid alternating movements normal in all extremities. Finger-to-nose and heel-to-shin performed accurately bilaterally. Gait and Station: Arises from chair without difficulty. Stance is normal. Gait demonstrates normal stride length and balance . Able to heel, toe and  tandem walk with moderate difficulty.  Reflexes: 1+ and symmetric. Toes downgoing.      ASSESSMENT: 67 year old African-American lady with recurrent transient episodes of posterior left neck pain, with left upper extremity paresthesias and weakness of unclear etiology possible cervical radiculopathy rather than simple partial seizures as she seems to have somewhat similar but milder episodes on the right side a few years ago.  She.  She also has musculoskeletal posterior neck pain as well as left hand carpal tunnel.     PLAN:I had a long discussion with the patient regarding her symptoms of recurrent transient left-sided neck pain and left arm paresthesias and weakness and discussed differential diagnosis of cervical radiculopathy versus partial seizures and results of imaging studies and answered questions.  Since she is having trouble tolerating Keppra and recommend she reduce the dose to 250 mg twice daily.  Continue gabapentin 300 mg 3 times daily which seems to be helping her carpal tunnel and diabetic neuropathy pain symptoms.  Check EEG.  I also advised her to do regular neck stretching exercises and to use local heat application to help with her musculoskeletal posterior neck pain.  I also advised her to wear carpal tunnel splint on her left wrist and to avoid activities involving rapid repetitive wrist flexion movements.  She will return for follow-up in the future in 3 months with my nurse practitioner on call earlier if necessary. Greater than 50% time during this  45-minute visit was spent on counseling and coordination of care about her symptoms of recurrent left neck and arm pain paresthesias and weakness discussion about definitive Delia Heady, MD Note: This document was prepa  Red with digital dictation and possible smart phrase technology. Any transcriptional errors that result from this process are unintentional.

## 2023-09-18 NOTE — Patient Instructions (Signed)
I had a long discussion with the patient regarding her symptoms of recurrent transient left-sided neck pain and left arm paresthesias and weakness and discussed differential diagnosis of cervical radiculopathy versus partial seizures and results of imaging studies and answered questions.  Since she is having trouble tolerating Keppra and recommend she reduce the dose to 250 mg twice daily.  Continue gabapentin 300 mg 3 times daily which seems to be helping her carpal tunnel and diabetic neuropathy pain symptoms.  Check EEG.  I also advised her to do regular neck stretching exercises and to use local heat application to help with her musculoskeletal posterior neck pain.  I also advised her to wear carpal tunnel splint on her left wrist and to avoid activities involving rapid repetitive wrist flexion movements.  She will return for follow-up in the future in 3 months with my nurse practitioner on call earlier if necessary.  Neck Exercises Ask your health care provider which exercises are safe for you. Do exercises exactly as told by your health care provider and adjust them as directed. It is normal to feel mild stretching, pulling, tightness, or discomfort as you do these exercises. Stop right away if you feel sudden pain or your pain gets worse. Do not begin these exercises until told by your health care provider. Neck exercises can be important for many reasons. They can improve strength and maintain flexibility in your neck, which will help your upper back and prevent neck pain. Stretching exercises Rotation neck stretching  Sit in a chair or stand up. Place your feet flat on the floor, shoulder-width apart. Slowly turn your head (rotate) to the right until a slight stretch is felt. Turn it all the way to the right so you can look over your right shoulder. Do not tilt or tip your head. Hold this position for 10-30 seconds. Slowly turn your head (rotate) to the left until a slight stretch is felt. Turn  it all the way to the left so you can look over your left shoulder. Do not tilt or tip your head. Hold this position for 10-30 seconds. Repeat __________ times. Complete this exercise __________ times a day. Neck retraction  Sit in a sturdy chair or stand up. Look straight ahead. Do not bend your neck. Use your fingers to push your chin backward (retraction). Do not bend your neck for this movement. Continue to face straight ahead. If you are doing the exercise properly, you will feel a slight sensation in your throat and a stretch at the back of your neck. Hold the stretch for 1-2 seconds. Repeat __________ times. Complete this exercise __________ times a day. Strengthening exercises Neck press  Lie on your back on a firm bed or on the floor with a pillow under your head. Use your neck muscles to push your head down on the pillow and straighten your spine. Hold the position as well as you can. Keep your head facing up (in a neutral position) and your chin tucked. Slowly count to 5 while holding this position. Repeat __________ times. Complete this exercise __________ times a day. Isometrics These are exercises in which you strengthen the muscles in your neck while keeping your neck still (isometrics). Sit in a supportive chair and place your hand on your forehead. Keep your head and face facing straight ahead. Do not flex or extend your neck while doing isometrics. Push forward with your head and neck while pushing back with your hand. Hold for 10 seconds. Do the sequence  again, this time putting your hand against the back of your head. Use your head and neck to push backward against the hand pressure. Finally, do the same exercise on either side of your head, pushing sideways against the pressure of your hand. Repeat __________ times. Complete this exercise __________ times a day. Prone head lifts  Lie face-down (prone position), resting on your elbows so that your chest and upper back  are raised. Start with your head facing downward, near your chest. Position your chin either on or near your chest. Slowly lift your head upward. Lift until you are looking straight ahead. Then continue lifting your head as far back as you can comfortably stretch. Hold your head up for 5 seconds. Then slowly lower it to your starting position. Repeat __________ times. Complete this exercise __________ times a day. Supine head lifts  Lie on your back (supine position), bending your knees to point to the ceiling and keeping your feet flat on the floor. Lift your head slowly off the floor, raising your chin toward your chest. Hold for 5 seconds. Repeat __________ times. Complete this exercise __________ times a day. Scapular retraction  Stand with your arms at your sides. Look straight ahead. Slowly pull both shoulders (scapulae) backward and downward (retraction) until you feel a stretch between your shoulder blades in your upper back. Hold for 10-30 seconds. Relax and repeat. Repeat __________ times. Complete this exercise __________ times a day. Contact a health care provider if: Your neck pain or discomfort gets worse when you do an exercise. Your neck pain or discomfort does not improve within 2 hours after you exercise. If you have any of these problems, stop exercising right away. Do not do the exercises again unless your health care provider says that you can. Get help right away if: You develop sudden, severe neck pain. If this happens, stop exercising right away. Do not do the exercises again unless your health care provider says that you can. This information is not intended to replace advice given to you by your health care provider. Make sure you discuss any questions you have with your health care provider. Document Revised: 05/31/2021 Document Reviewed: 05/31/2021 Elsevier Patient Education  2024 [employer].

## 2023-09-18 NOTE — Telephone Encounter (Signed)
Called [facility] back. Appears the ER MD had discharged him home with keppra 500 mg BID. Since being on the medication pt has experienced side effects and per the visit today he recommends that she reduces the dose to 250 mg twice a day. I asked can she break the 500 mg tablet in half and he states since its a change in dose they can just fill for the correct dose. They will cancel the previous script out and fill off of Dr Pearlean Brownie.

## 2023-09-18 NOTE — Telephone Encounter (Signed)
[facility] Fayrene Fearing) want to make neurologist aware that patient already has prescription for Keppra by another physician Dr Karsten Fells 500 mg tablet take twice daily. The prescription was written 09/06/23 and we filled it the same day.

## 2023-09-20 NOTE — Telephone Encounter (Signed)
Pt returned, just informed her MD stated she is to take 250 BID. She will utilize 500mg  and take half of those. She will go to pick up new Rx today.

## 2023-10-09 ENCOUNTER — Other Ambulatory Visit: Payer: Self-pay | Admitting: Internal Medicine

## 2023-10-09 DIAGNOSIS — G5621 Lesion of ulnar nerve, right upper limb: Secondary | ICD-10-CM

## 2023-10-09 NOTE — Telephone Encounter (Unsigned)
Copied from CRM [id number]. Topic: General - Other >> Oct 09, 2023 12:39 PM Everette C wrote: Reason for CRM: Medication Refill - Medication: gabapentin (NEURONTIN) 300 MG capsule [[id number]]  Has the patient contacted their pharmacy? Yes.   (Agent: If no, request that the patient contact the pharmacy for the refill. If patient does not wish to contact the pharmacy document the reason why and proceed with request.) (Agent: If yes, when and what did the pharmacy advise?)  Preferred Pharmacy (with phone number or street name): [id number] - [place], [place] - [address] [place] [address] Phone: [phone] Fax: [phone] Hours: Not open 24 hours   Has the patient been seen for an appointment in the last year OR does the patient have an upcoming appointment? Yes.    Agent: Please be advised that RX refills may take up to 3 business days. We ask that you follow-up with your pharmacy.
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
## 2023-10-12 ENCOUNTER — Telehealth: Payer: Self-pay | Admitting: Internal Medicine

## 2023-10-12 NOTE — Telephone Encounter (Signed)
I received a refill request for gabapentin.  Looks like the last time this prescription was written was in 2022.  Please inquire whether she is still taking this medication and what is she taking it for.

## 2023-10-15 NOTE — Telephone Encounter (Addendum)
Patient scheduled on walk-in schedule on Thursday. Patient made aware that only reason/refill of gabapentin is the only thing that well be discussed

## 2023-10-15 NOTE — Telephone Encounter (Signed)
Called but no answer. LVM to call back.  

## 2023-10-15 NOTE — Telephone Encounter (Signed)
Pt called saying she has been taking it.  She said [facility] has been filling it but said she needs a new prescription.  She is taking it three times a day at 300 mgs.  It is for pain.  She said she has been taking it for years.  She uses [address].  CB@  [phone]

## 2023-10-16 ENCOUNTER — Telehealth: Payer: Self-pay | Admitting: Orthopaedic Surgery

## 2023-10-16 ENCOUNTER — Ambulatory Visit (INDEPENDENT_AMBULATORY_CARE_PROVIDER_SITE_OTHER): Payer: Medicare (Managed Care) | Admitting: Neurology

## 2023-10-16 DIAGNOSIS — G40009 Localization-related (focal) (partial) idiopathic epilepsy and epileptic syndromes with seizures of localized onset, not intractable, without status epilepticus: Secondary | ICD-10-CM | POA: Diagnosis not present

## 2023-10-16 NOTE — Telephone Encounter (Signed)
Patient called and said that she decided to do the surgery on her left hand. CB#[phone]

## 2023-10-16 NOTE — Telephone Encounter (Signed)
Noted! Thank you

## 2023-10-18 ENCOUNTER — Other Ambulatory Visit: Payer: Self-pay | Admitting: Physician Assistant

## 2023-10-18 ENCOUNTER — Telehealth: Payer: Self-pay

## 2023-10-18 ENCOUNTER — Ambulatory Visit: Payer: Medicare (Managed Care) | Attending: Family Medicine | Admitting: Physician Assistant

## 2023-10-18 ENCOUNTER — Other Ambulatory Visit: Payer: Self-pay | Admitting: Internal Medicine

## 2023-10-18 DIAGNOSIS — G5621 Lesion of ulnar nerve, right upper limb: Secondary | ICD-10-CM

## 2023-10-18 DIAGNOSIS — I10 Essential (primary) hypertension: Secondary | ICD-10-CM

## 2023-10-18 DIAGNOSIS — E119 Type 2 diabetes mellitus without complications: Secondary | ICD-10-CM

## 2023-10-18 MED ORDER — GABAPENTIN 300 MG PO CAPS: 300.0000 mg | ORAL_CAPSULE | Freq: Three times a day (TID) | ORAL | 1 refills | Status: AC

## 2023-10-18 MED ORDER — ONETOUCH DELICA LANCETS 33G MISC: 11 refills | Status: AC

## 2023-10-18 MED ORDER — ONETOUCH DELICA PLUS LANCING MISC: 6 refills | Status: AC

## 2023-10-18 MED ORDER — ONETOUCH VERIO VI STRP: ORAL_STRIP | 6 refills | Status: AC

## 2023-10-18 MED ORDER — ONETOUCH VERIO W/DEVICE KIT: PACK | 0 refills | Status: AC

## 2023-10-18 MED ORDER — ONETOUCH VERIO VI STRP: ORAL_STRIP | 6 refills | Status: DC

## 2023-10-18 NOTE — Addendum Note (Signed)
Addended by: Elsie Lincoln F on: 10/18/2023 05:07 PM   Modules accepted: Orders

## 2023-10-18 NOTE — Telephone Encounter (Signed)
Pt stated the Verio Onetouch meter, delica plus lancets, and needs strips.  Please advise.

## 2023-10-18 NOTE — Telephone Encounter (Signed)
Copied from CRM [id number]. Topic: General - Inquiry >> Oct 18, 2023 11:17 AM Haroldine Laws wrote: Reason for CRM: pt called saying Cassandra called her after she left the office   she would like for her to call back .

## 2023-10-18 NOTE — Telephone Encounter (Signed)
Patient called, left VM to return the call to the office. Will need to know the type of glucose meter she has so that the correct lancets and strips are ordered.

## 2023-10-18 NOTE — Telephone Encounter (Signed)
Pt returning call to Cassandra. Please cb

## 2023-10-18 NOTE — Telephone Encounter (Signed)
Medication Refill - Medication: Lanc[facility] DELICA PLUS LANCING) MISC [[id number]   and    glucose blood (ONETOUCH VERIO) test strip  Has the patient contacted their pharmacy? Yes.   (   Preferred Pharmacy (with phone number or street name):    [facility] [address] [place], [place] - [address]  [address] [place], [place] [address]  Phone:  [phone]  Fax:  [phone]  DEA #:  -- [id number] Reason: -- Has the patient been seen for an appointment in the last year OR does the patient have an upcoming appointment? Yes.    Agent: Please be advised that RX refills may take up to 3 business days. We ask that you follow-up with your pharmacy.

## 2023-10-18 NOTE — Progress Notes (Signed)
Not seen

## 2023-10-18 NOTE — Telephone Encounter (Signed)
If patient call back please let her know that Blood Glucose supplies have been to her pharmarcy

## 2023-10-18 NOTE — Addendum Note (Signed)
Addended by: Wilford Corner on: 10/18/2023 01:46 PM   Modules accepted: Orders
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
## 2023-10-21 NOTE — Progress Notes (Signed)
 Kindly inform the patient that EEG of brainwave study was normal.  No seizure activity noted.

## 2023-10-24 ENCOUNTER — Telehealth: Payer: Self-pay

## 2023-10-24 NOTE — Telephone Encounter (Signed)
Pt asking do I need to l continue or discontinue levETIRAcetam (KEPPRA) 250 MG tablet because EEG results were good, with no abnormal brain activity was found. Already on Gabapentin 900 mg daily. Would like a call back.

## 2023-10-24 NOTE — Telephone Encounter (Signed)
-----   Message from Renee Murray sent at 10/21/2023  3:38 PM EST ----- Kindly inform the patient that EEG of brainwave study was normal.  No seizure activity noted.

## 2023-10-24 NOTE — Telephone Encounter (Signed)
Contacted pt, LVM per DPR, informing her EEG of brainwave study was normal.  No seizure activity noted.  Advised to keep her follow-up as scheduled in January.  Advised to call the office back with any questions or concerns, number provided.

## 2023-10-24 NOTE — Telephone Encounter (Signed)
Dr Pearlean Brownie, does this pt need to  continue or discontinue levETIRAcetam (KEPPRA) 250 MG tablet because EEG results were good, with no abnormal brain activity was found. Already on Gabapentin 900 mg daily. Please advise

## 2023-10-25 ENCOUNTER — Ambulatory Visit: Payer: Medicare (Managed Care) | Admitting: Orthopaedic Surgery

## 2023-10-25 ENCOUNTER — Encounter: Payer: Self-pay | Admitting: Orthopaedic Surgery

## 2023-10-25 DIAGNOSIS — G5602 Carpal tunnel syndrome, left upper limb: Secondary | ICD-10-CM

## 2023-10-25 NOTE — Telephone Encounter (Signed)
Pt returned phone call. Nurse Baird Lyons send a message to Dr. Pearlean Brownie. Baird Lyons recommended to discontinue taking Keppra until Dr. Pearlean Brownie has instructed to discontinue taking Keppra. Relayed message the patient. Patient verbalized understand, but would like Dr. Pearlean Brownie to call her.

## 2023-10-25 NOTE — Telephone Encounter (Signed)
Contacted pt, no answer  °

## 2023-10-25 NOTE — Progress Notes (Signed)
Office Visit Note   Patient: Renee Murray           Date of Birth: [birth year]           MRN: [id number] Visit Date: 10/25/2023              Requested by: Marcine Matar, MD [address] [place] [address] [place],  [place] [address] PCP: Marcine Matar, MD   Assessment & Plan: Visit Diagnoses:  1. Left carpal tunnel syndrome     Plan: Impression 67 year old female with left carpal tunnel syndrome.  Reminiscent of the right hand.  Based on treatment options she would like to move forward with scheduling for left carpal tunnel release.  Questions encouraged and answered.  Renee Murray will confirm surgery date.  Follow-Up Instructions: No follow-ups on file.   Orders:  No orders of the defined types were placed in this encounter.  No orders of the defined types were placed in this encounter.     Procedures: No procedures performed   Clinical Data: No additional findings.   Subjective: Chief Complaint  Patient presents with   Left Hand - Pain    HPI Renee Murray is a 67 year old female comes in for evaluation of left hand pain and numbness and weakness.  Works as a Scientist, physiological.  Underwent right carpal tunnel release in 2021.  She would like to look at surgery for the left hand.  Denies any recent injuries. Review of Systems  Constitutional: Negative.   HENT: Negative.    Eyes: Negative.   Respiratory: Negative.    Cardiovascular: Negative.   Endocrine: Negative.   Musculoskeletal: Negative.   Neurological: Negative.   Hematological: Negative.   Psychiatric/Behavioral: Negative.    All other systems reviewed and are negative.    Objective: Vital Signs: There were no vitals taken for this visit.  Physical Exam Vitals and nursing note reviewed.  Constitutional:      Appearance: She is well-developed.  HENT:     Head: Atraumatic.     Nose: Nose normal.  Eyes:     Extraocular Movements: Extraocular movements intact.  Cardiovascular:     Pulses: Normal pulses.   Pulmonary:     Effort: Pulmonary effort is normal.  Abdominal:     Palpations: Abdomen is soft.  Musculoskeletal:     Cervical back: Neck supple.  Skin:    General: Skin is warm.     Capillary Refill: Capillary refill takes less than 2 seconds.  Neurological:     Mental Status: She is alert. Mental status is at baseline.  Psychiatric:        Behavior: Behavior normal.        Thought Content: Thought content normal.        Judgment: Judgment normal.     Ortho Exam Exam of the left hand shows no muscle atrophy.  Positive carpal tunnel compressive signs. Specialty Comments:  No specialty comments available.  Imaging: No results found.   PMFS History: Patient Active Problem List   Diagnosis Date Noted   Seizure (HCC) 09/06/2023   TIA (transient ischemic attack) 09/05/2023   Thrombocytopenia (HCC) 09/05/2023   Brain aneurysm 09/05/2023   Obesity (BMI 30-39.9) 09/05/2023   Positive colorectal cancer screening using Cologuard test 11/18/2021   Type 2 diabetes mellitus without complication, without long-term current use of insulin (HCC) 07/19/2021   Cellulitis of left lower extremity 06/01/2021   Acute back pain with sciatica, right 05/31/2021   Dermatitis 05/31/2021   Right sciatic  nerve pain 05/30/2021   Rash and nonspecific skin eruption 05/30/2021   Acute midline low back pain 12/27/2016   Acute pain of both shoulders 12/27/2016   Rotator cuff impingement syndrome 12/27/2016   Essential hypertension 01/25/2016   Chronic pain syndrome 01/25/2016   Prediabetes 10/13/2015   Anxiety about health 09/13/2015   Neuropathy 09/13/2015   Chronic generalized pain 09/13/2015   History of seasonal allergies 09/13/2015   SAH (subarachnoid hemorrhage) (HCC)    Subarachnoid hemorrhage (HCC) 02/04/2015   History of sinus disease on CT 08/10/2014   Hypertension, goal below 150/90 03/17/2014   Health care maintenance 03/17/2014   Tobacco abuse 03/17/2014   Neuropathy, upper  extremity bilaterally  08/06/2013   Past Medical History:  Diagnosis Date   Brain aneurysm    Diabetes mellitus without complication (HCC)    Hypertension    Stroke (HCC)    stroke like symptons/admitted 8/21    Family History  Problem Relation Age of Onset   Diabetes Mother    Heart failure Mother    Cancer Father     Past Surgical History:  Procedure Laterality Date   CEREBRAL ANEURYSM REPAIR  2016   Yasargil clip x2 Model # [id number]  & [phone]   CHOLECYSTECTOMY     CRANIOTOMY Right 02/04/2015   Procedure: RIGHT CRANIOTOMY FOR ANEURYSM ;  Surgeon: Lisbeth Renshaw, MD;  Location: [facility];  Service: Neurosurgery;  Laterality: Right;   IR GENERIC HISTORICAL  02/04/2015   IR ANGIO INTRA EXTRACRAN SEL COM CAROTID INNOMINATE BILAT MOD SED 02/04/2015 Lisbeth Renshaw, MD [facility]   IR GENERIC HISTORICAL  02/04/2015   IR ANGIO VERTEBRAL SEL SUBCLAVIAN INNOMINATE UNI R MOD SED 02/04/2015 Lisbeth Renshaw, MD [facility]   IR GENERIC HISTORICAL  02/04/2015   IR ANGIO VERTEBRAL SEL VERTEBRAL UNI L MOD SED 02/04/2015 Lisbeth Renshaw, MD [facility]   TOTAL ABDOMINAL HYSTERECTOMY     Social History   Occupational History   Occupation: retired  Tobacco Use   Smoking status: Some Days    Current packs/day: 0.00    Types: Cigarettes   Smokeless tobacco: Never   Tobacco comments:    reports 1 pack per month  Vaping Use   Vaping status: Never Used  Substance and Sexual Activity   Alcohol use: Yes    Comment: Rarely.   Drug use: No   Sexual activity: Not Currently

## 2023-10-29 NOTE — Telephone Encounter (Signed)
Called the patient and there was no answer. Left a detailed message advising the patient that Dr Pearlean Brownie did reply stating that it was ok to dc keppra since the EEG was normal. Advised the pt to call back with any questions or concerns.

## 2023-12-28 ENCOUNTER — Ambulatory Visit: Payer: Medicare (Managed Care) | Admitting: Internal Medicine

## 2024-01-15 ENCOUNTER — Ambulatory Visit: Payer: Medicare (Managed Care) | Admitting: Family Medicine

## 2024-01-17 ENCOUNTER — Telehealth: Payer: Self-pay | Admitting: Orthopaedic Surgery

## 2024-01-17 NOTE — Telephone Encounter (Signed)
Called patient and left voicemail.  Stated I was with Dr Warren Danes office  and requested she call my direct number [phone].

## 2024-01-18 ENCOUNTER — Other Ambulatory Visit: Payer: Self-pay

## 2024-01-18 ENCOUNTER — Encounter (HOSPITAL_COMMUNITY): Payer: Self-pay | Admitting: Emergency Medicine

## 2024-01-18 ENCOUNTER — Emergency Department (HOSPITAL_COMMUNITY)
Admission: EM | Admit: 2024-01-18 | Discharge: 2024-01-18 | Discharge status: Against Medical Advice | Payer: Medicare (Managed Care) | Attending: Emergency Medicine | Admitting: Emergency Medicine

## 2024-01-18 ENCOUNTER — Emergency Department (HOSPITAL_COMMUNITY): Payer: Medicare (Managed Care)

## 2024-01-18 DIAGNOSIS — Z1152 Encounter for screening for COVID-19: Secondary | ICD-10-CM | POA: Diagnosis not present

## 2024-01-18 DIAGNOSIS — Z5321 Procedure and treatment not carried out due to patient leaving prior to being seen by health care provider: Secondary | ICD-10-CM | POA: Diagnosis not present

## 2024-01-18 DIAGNOSIS — R059 Cough, unspecified: Secondary | ICD-10-CM | POA: Insufficient documentation

## 2024-01-18 DIAGNOSIS — M25511 Pain in right shoulder: Secondary | ICD-10-CM | POA: Insufficient documentation

## 2024-01-18 LAB — RESP PANEL BY RT-PCR (RSV, FLU A&B, COVID)  RVPGX2
Influenza A by PCR: NEGATIVE
Influenza B by PCR: NEGATIVE
Resp Syncytial Virus by PCR: NEGATIVE
SARS Coronavirus 2 by RT PCR: NEGATIVE

## 2024-01-18 MED ORDER — KETOROLAC TROMETHAMINE 15 MG/ML IJ SOLN
15.0000 mg | Freq: Once | INTRAMUSCULAR | Status: AC
  Administered 2024-01-18: 15 mg via INTRAMUSCULAR
  Filled 2024-01-18: qty 1

## 2024-01-18 NOTE — ED Notes (Signed)
Pt states they are going to check results online, moved to OTF.

## 2024-01-18 NOTE — ED Provider Triage Note (Signed)
Emergency Medicine Provider Triage Evaluation Note  Renee Murray , a 68 y.o. female  was evaluated in triage.  Pt complains of right shoulder pain.  Review of Systems  Positive:  Negative:   Physical Exam  BP (!) 156/68 (BP Location: Right Arm)   Pulse 69   Temp 98.1 F (36.7 C) (Oral)   Resp 15   Ht 4\' 10"  (1.473 m)   Wt 69.9 kg   SpO2 94%   BMI 32.21 kg/m  Gen:   Awake, no distress   Resp:  Normal effort  MSK:   Moves extremities without difficulty  Other:    Medical Decision Making  Medically screening exam initiated at 12:55 PM.  Appropriate orders placed.  DARSHANA CURNUTT was informed that the remainder of the evaluation will be completed by another provider, this initial triage assessment does not replace that evaluation, and the importance of remaining in the ED until their evaluation is complete.  Right shoulder pain over the past month. Pain was severe earlier this month but has gotten better. Happened after a long day of yardwork. Ibuprofen, Tylenol, BC powder, creams with out full relief. Patient also with viral symptoms and would like a swab today. Patient providing a lot of information when answering simple questions so further history was hard to obtain.    Dorthy Cooler, [place] 01/18/24 [id number]

## 2024-01-18 NOTE — ED Triage Notes (Signed)
Right shoulder pain and right sided neck pain and right upper arm pain x 1 month denies any injury. PT also complains of productive cough that started last night. Chest hurts with every cough.

## 2024-01-22 ENCOUNTER — Telehealth: Payer: Self-pay | Admitting: Internal Medicine

## 2024-01-22 NOTE — Telephone Encounter (Signed)
Pt called requesting to see if her pcp could come downstairs to talk to her I advise the PEC to let her know that I will send a message to CMA they will reach out to her.

## 2024-01-22 NOTE — Telephone Encounter (Signed)
Called & spoke to the patient. Verified name & DOB. Patient stated that she is having cough, sneezing, phlegm and would like to address shoulder pain. Appointment scheduled for 01/22/2024. Patient confirmed appointment.

## 2024-01-23 ENCOUNTER — Ambulatory Visit: Payer: Medicare (Managed Care) | Admitting: Physician Assistant

## 2024-01-29 NOTE — Progress Notes (Signed)
Chart was reviewed and her chief complaint from this visit it appears patient was complaining of cough and requested a viral swab thus the viral swab was ordered given the ongoing pandemic and amount of other viruses in the community.

## 2024-02-12 ENCOUNTER — Ambulatory Visit
Admission: EM | Admit: 2024-02-12 | Discharge: 2024-02-12 | Disposition: A | Discharge status: Home / Self Care | Payer: Medicare (Managed Care) | Attending: Family Medicine | Admitting: Family Medicine

## 2024-02-12 ENCOUNTER — Ambulatory Visit (INDEPENDENT_AMBULATORY_CARE_PROVIDER_SITE_OTHER): Payer: Medicare (Managed Care)

## 2024-02-12 ENCOUNTER — Other Ambulatory Visit: Payer: Self-pay

## 2024-02-12 VITALS — BP 145/78 | HR 80 | Temp 98.0°F | Resp 18

## 2024-02-12 DIAGNOSIS — R052 Subacute cough: Secondary | ICD-10-CM | POA: Diagnosis not present

## 2024-02-12 DIAGNOSIS — J441 Chronic obstructive pulmonary disease with (acute) exacerbation: Secondary | ICD-10-CM

## 2024-02-12 DIAGNOSIS — G8929 Other chronic pain: Secondary | ICD-10-CM

## 2024-02-12 MED ORDER — DOXYCYCLINE HYCLATE 100 MG PO CAPS
100.0000 mg | ORAL_CAPSULE | Freq: Two times a day (BID) | ORAL | 0 refills | Status: AC
Start: 2024-02-12 — End: 2024-02-19

## 2024-02-12 MED ORDER — PREDNISONE 20 MG PO TABS: 40.0000 mg | ORAL_TABLET | Freq: Every day | ORAL | 0 refills | Status: AC

## 2024-02-12 NOTE — ED Triage Notes (Signed)
 Pt here for cough x 1 month since viral illness that is still productive; pt sts chronic right shoulder pain worse at night but denies injury; pt had xrays but LWBS and would like results

## 2024-02-12 NOTE — Discharge Instructions (Signed)
 By my review there is no new pneumonia on your chest x-ray.  The radiologist will also read your x-ray, and if their interpretation differs significantly from mine, and it would change your management, we will call you.  Take prednisone 20 mg--2 daily for 5 days  Take doxycycline 100 mg --1 capsule 2 times daily for 7 days; this is an antibiotic.  It would treat pneumonia if there was any, and I think he should take it for bronchitis.

## 2024-02-12 NOTE — ED Provider Notes (Signed)
 [facility]    CSN: [id number] Arrival date & time: 02/12/24  1144      History   Chief Complaint Chief Complaint  Patient presents with   Cough   Shoulder Pain    HPI Renee Murray is a 68 y.o. female.    Cough Shoulder Pain Here for cough is been going on for about a month.  No recent fever.  She is bringing up a good bit of mucus when she coughs.  She went to the emergency room on January 31 and had x-rays done of her chest and of her right shoulder which has been hurting her.  She did not stay for results.  She states she does not have a history of asthma, but she has been prescribed an inhaler.  She does smoke.  She is allergic to Demerol, morphine, and codeine.    Past Medical History:  Diagnosis Date   Brain aneurysm    Diabetes mellitus without complication (HCC)    Hypertension    Stroke (HCC)    stroke like symptons/admitted 8/21    Patient Active Problem List   Diagnosis Date Noted   Seizure (HCC) 09/06/2023   TIA (transient ischemic attack) 09/05/2023   Thrombocytopenia (HCC) 09/05/2023   Brain aneurysm 09/05/2023   Obesity (BMI 30-39.9) 09/05/2023   Positive colorectal cancer screening using Cologuard test 11/18/2021   Type 2 diabetes mellitus without complication, without long-term current use of insulin (HCC) 07/19/2021   Cellulitis of left lower extremity 06/01/2021   Acute back pain with sciatica, right 05/31/2021   Dermatitis 05/31/2021   Right sciatic nerve pain 05/30/2021   Rash and nonspecific skin eruption 05/30/2021   Acute midline low back pain 12/27/2016   Acute pain of both shoulders 12/27/2016   Rotator cuff impingement syndrome 12/27/2016   Essential hypertension 01/25/2016   Chronic pain syndrome 01/25/2016   Prediabetes 10/13/2015   Anxiety about health 09/13/2015   Neuropathy 09/13/2015   Chronic generalized pain 09/13/2015   History of seasonal allergies 09/13/2015   SAH (subarachnoid hemorrhage) (HCC)     Subarachnoid hemorrhage (HCC) 02/04/2015   History of sinus disease on CT 08/10/2014   Hypertension, goal below 150/90 03/17/2014   Health care maintenance 03/17/2014   Tobacco abuse 03/17/2014   Neuropathy, upper extremity bilaterally  08/06/2013    Past Surgical History:  Procedure Laterality Date   CEREBRAL ANEURYSM REPAIR  2016   Yasargil clip x2 Model # AO130Q  & [id number]   CHOLECYSTECTOMY     CRANIOTOMY Right 02/04/2015   Procedure: RIGHT CRANIOTOMY FOR ANEURYSM ;  Surgeon: Lisbeth Renshaw, MD;  Location: [facility];  Service: Neurosurgery;  Laterality: Right;   IR GENERIC HISTORICAL  02/04/2015   IR ANGIO INTRA EXTRACRAN SEL COM CAROTID INNOMINATE BILAT MOD SED 02/04/2015 Lisbeth Renshaw, MD [facility]   IR GENERIC HISTORICAL  02/04/2015   IR ANGIO VERTEBRAL SEL SUBCLAVIAN INNOMINATE UNI R MOD SED 02/04/2015 Lisbeth Renshaw, MD [facility]   IR GENERIC HISTORICAL  02/04/2015   IR ANGIO VERTEBRAL SEL VERTEBRAL UNI L MOD SED 02/04/2015 Lisbeth Renshaw, MD [facility]   TOTAL ABDOMINAL HYSTERECTOMY      OB History   No obstetric history on file.      Home Medications    Prior to Admission medications   Medication Sig Start Date End Date Taking? Authorizing Provider  doxycycline (VIBRAMYCIN) 100 MG capsule Take 1 capsule (100 mg total) by mouth 2 (two) times daily for 7 days.  02/12/24 02/19/24 Yes Avril Busser, Janace Aris, MD  predniSONE (DELTASONE) 20 MG tablet Take 2 tablets (40 mg total) by mouth daily with breakfast for 5 days. 02/12/24 02/17/24 Yes Zenia Resides, MD  albuterol (VENTOLIN HFA) 108 (90 Base) MCG/ACT inhaler Inhale 1-2 puffs into the lungs every 6 (six) hours as needed for wheezing or shortness of breath. 11/22/22   Raspet, Noberto Retort, PA-C  amLODipine (NORVASC) 10 MG tablet TAKE 1 TABLET DAILY TO LOWER BLOOD PRESSURE 03/05/23   Marcine Matar, MD  Blood Glucose Monitoring Suppl (ONETOUCH VERIO) w/Device KIT Use to test blood sugar once daily. E11.9  10/18/23   Anders Simmonds, PA-C  cetirizine (ZYRTEC) 10 MG tablet Take 1 tablet (10 mg total) by mouth daily. 03/08/23   Anders Simmonds, PA-C  citalopram (CELEXA) 10 MG tablet Take 1 tablet (10 mg total) by mouth daily. 03/05/23   Marcine Matar, MD  fluticasone (FLONASE) 50 MCG/ACT nasal spray Place 1 spray into both nostrils daily. Patient taking differently: Place 1 spray into both nostrils daily as needed for allergies. 11/22/22   Raspet, Noberto Retort, PA-C  gabapentin (NEURONTIN) 300 MG capsule Take 1 capsule (300 mg total) by mouth 3 (three) times daily. 10/18/23   Anders Simmonds, PA-C  glucose blood (ONETOUCH VERIO) test strip Use to check blood sugar once daily. E11.9 10/18/23   Anders Simmonds, PA-C  Lancet Devices [facility] DELICA PLUS LANCING) MISC Use to check blood sugar once daily. E11.9 10/18/23   Anders Simmonds, PA-C  levETIRAcetam (KEPPRA) 250 MG tablet Take 1 tablet (250 mg total) by mouth 2 (two) times daily. 09/18/23   Micki Riley, MD  OneTouch Delica Lancets 33G MISC Test blood sugars twice daily. E11.9 10/18/23   Marcine Matar, MD  trolamine salicylate (ASPERCREME) 10 % cream Apply 1 application topically as needed for muscle pain (or sciatica).     [provider]    Family History Family History  Problem Relation Age of Onset   Diabetes Mother    Heart failure Mother    Cancer Father     Social History Social History   Tobacco Use   Smoking status: Some Days    Current packs/day: 0.00    Types: Cigarettes   Smokeless tobacco: Never   Tobacco comments:    reports 1 pack per month  Vaping Use   Vaping status: Never Used  Substance Use Topics   Alcohol use: Yes    Comment: Rarely.   Drug use: No     Allergies   Demerol and Morphine and codeine   Review of Systems Review of Systems  Respiratory:  Positive for cough.      Physical Exam Triage Vital Signs ED Triage Vitals [02/12/24 1207]  Encounter Vitals Group     BP  (!) 145/78     Systolic BP Percentile      Diastolic BP Percentile      Pulse Rate 80     Resp 18     Temp 98 F (36.7 C)     Temp Source Oral     SpO2 95 %     Weight      Height      Head Circumference      Peak Flow      Pain Score 4     Pain Loc      Pain Education      Exclude from Growth Chart    No data found.  Updated Vital Signs BP (!) 145/78 (BP Location: Right Arm)   Pulse 80   Temp 98 F (36.7 C) (Oral)   Resp 18   SpO2 95%   Visual Acuity Right Eye Distance:   Left Eye Distance:   Bilateral Distance:    Right Eye Near:   Left Eye Near:    Bilateral Near:     Physical Exam Vitals reviewed.  Constitutional:      General: She is not in acute distress.    Appearance: She is not ill-appearing, toxic-appearing or diaphoretic.  HENT:     Right Ear: Tympanic membrane and ear canal normal.     Left Ear: Tympanic membrane and ear canal normal.     Nose: Congestion present.     Mouth/Throat:     Mouth: Mucous membranes are moist.     Pharynx: No oropharyngeal exudate or posterior oropharyngeal erythema.  Eyes:     Extraocular Movements: Extraocular movements intact.     Conjunctiva/sclera: Conjunctivae normal.     Pupils: Pupils are equal, round, and reactive to light.  Cardiovascular:     Rate and Rhythm: Normal rate and regular rhythm.     Heart sounds: No murmur heard. Pulmonary:     Effort: No respiratory distress.     Breath sounds: No stridor. No rhonchi or rales.     Comments: There are some coarse breath sounds, more so in her right posterior lung field.  No overt wheezing heard at the time of exam. Chest:     Chest wall: No tenderness.  Musculoskeletal:     Cervical back: Neck supple.  Lymphadenopathy:     Cervical: No cervical adenopathy.  Skin:    Capillary Refill: Capillary refill takes less than 2 seconds.     Coloration: Skin is not jaundiced or pale.  Neurological:     General: No focal deficit present.     Mental Status: She is  alert and oriented to person, place, and time.  Psychiatric:        Behavior: Behavior normal.      UC Treatments / Results  Labs (all labs ordered are listed, but only abnormal results are displayed) Labs Reviewed - No data to display  EKG   Radiology No results found.  Procedures Procedures (including critical care time)  Medications Ordered in UC Medications - No data to display  Initial Impression / Assessment and Plan / UC Course  I have reviewed the triage vital signs and the nursing notes.  Pertinent labs & imaging results that were available during my care of the patient were reviewed by me and considered in my medical decision making (see chart for details).     By my review the chest x-ray is stable.  She did not take his big and inspiration on the films today as she did in January.  She is advised of radiology over read.  Prednisone is sent in for what might be a COPD exacerbation and doxycycline is sent in for bronchitis.  I reviewed her prior x-ray results with her, including the normal shoulder x-ray.  I have asked her to follow-up with her primary care or her orthopedist about the shoulder Final Clinical Impressions(s) / UC Diagnoses   Final diagnoses:  Subacute cough  Chronic right shoulder pain  COPD exacerbation (HCC)     Discharge Instructions      By my review there is no new pneumonia on your chest x-ray.  The radiologist will also read your x-ray,  and if their interpretation differs significantly from mine, and it would change your management, we will call you.  Take prednisone 20 mg--2 daily for 5 days  Take doxycycline 100 mg --1 capsule 2 times daily for 7 days; this is an antibiotic.  It would treat pneumonia if there was any, and I think he should take it for bronchitis.       ED Prescriptions     Medication Sig Dispense Auth. Provider   doxycycline (VIBRAMYCIN) 100 MG capsule Take 1 capsule (100 mg total) by mouth 2 (two)  times daily for 7 days. 14 capsule Zenia Resides, MD   predniSONE (DELTASONE) 20 MG tablet Take 2 tablets (40 mg total) by mouth daily with breakfast for 5 days. 10 tablet Marlinda Mike Janace Aris, MD      PDMP not reviewed this encounter.   Zenia Resides, MD 02/12/24 1248

## 2024-05-22 ENCOUNTER — Telehealth: Payer: Self-pay | Admitting: *Deleted

## 2024-05-22 ENCOUNTER — Ambulatory Visit
Admission: RE | Admit: 2024-05-22 | Discharge: 2024-05-22 | Disposition: A | Discharge status: Home / Self Care | Payer: Medicare (Managed Care) | Source: Ambulatory Visit | Attending: Nurse Practitioner | Admitting: Nurse Practitioner

## 2024-05-22 VITALS — BP 166/68 | HR 65 | Temp 97.7°F | Resp 16

## 2024-05-22 DIAGNOSIS — N3001 Acute cystitis with hematuria: Secondary | ICD-10-CM | POA: Diagnosis not present

## 2024-05-22 LAB — POCT URINALYSIS DIP (MANUAL ENTRY)
Bilirubin, UA: NEGATIVE
Glucose, UA: NEGATIVE mg/dL
Ketones, POC UA: NEGATIVE mg/dL
Leukocytes, UA: NEGATIVE
Nitrite, UA: POSITIVE — AB
Protein Ur, POC: NEGATIVE mg/dL
Spec Grav, UA: 1.01 (ref 1.010–1.025)
Urobilinogen, UA: 0.2 U/dL
pH, UA: 6 (ref 5.0–8.0)

## 2024-05-22 MED ORDER — SULFAMETHOXAZOLE-TRIMETHOPRIM 800-160 MG PO TABS: 1.0000 | ORAL_TABLET | Freq: Two times a day (BID) | ORAL | 0 refills | Status: AC

## 2024-05-22 MED ORDER — NITROFURANTOIN MONOHYD MACRO 100 MG PO CAPS: 100.0000 mg | ORAL_CAPSULE | Freq: Two times a day (BID) | ORAL | 0 refills | Status: DC

## 2024-05-22 NOTE — Telephone Encounter (Signed)
 Macrobid  discontinued. New Rx for bactrim  sent in to treat UTI. Please notify patient and let her know that she does not need the Macrobid  any longer.

## 2024-05-22 NOTE — ED Provider Notes (Signed)
 [facility]    CSN: [id number] Arrival date & time: 05/22/24  1258      History   Chief Complaint Chief Complaint  Patient presents with   Urinary Tract Infection    HPI Renee Murray is a 68 y.o. female.   Renee Murray is a 68 y.o. female that presents to Urgent Care with concerns of a possible urinary tract infection. Her primary symptom is a strong or bad smell in her urine, which she noticed recently. The patient denies any pain or burning during urination and reports no increase in urinary frequency. She has not experienced any vaginal discharge or new lower back pain. In response to noticing the change in urine odor, Unita increased her water intake. She mentions drinking a lot of coffee and wonders if she might not be drinking enough water.   Today's evaluation has revealed no signs of a dangerous process. Discussed diagnosis with patient and/or guardian. Patient and/or guardian aware of their diagnosis, possible red flag symptoms to watch out for and need for close follow up. Patient and/or guardian understands verbal and written discharge instructions. Patient and/or guardian comfortable with plan and disposition.  Patient and/or guardian has a clear mental status at this time, good insight into illness (after discussion and teaching) and has clear judgment to make decisions regarding their care  Documentation was completed with the aid of voice recognition software. Transcription may contain typographical errors.     Past Medical History:  Diagnosis Date   Brain aneurysm    Diabetes mellitus without complication (HCC)    Hypertension    Stroke (HCC)    stroke like symptons/admitted 8/21    Patient Active Problem List   Diagnosis Date Noted   Seizure (HCC) 09/06/2023   TIA (transient ischemic attack) 09/05/2023   Thrombocytopenia (HCC) 09/05/2023   Brain aneurysm 09/05/2023   Obesity (BMI 30-39.9) 09/05/2023   Positive colorectal cancer  screening using Cologuard test 11/18/2021   Type 2 diabetes mellitus without complication, without long-term current use of insulin (HCC) 07/19/2021   Cellulitis of left lower extremity 06/01/2021   Acute back pain with sciatica, right 05/31/2021   Dermatitis 05/31/2021   Right sciatic nerve pain 05/30/2021   Rash and nonspecific skin eruption 05/30/2021   Acute midline low back pain 12/27/2016   Acute pain of both shoulders 12/27/2016   Rotator cuff impingement syndrome 12/27/2016   Essential hypertension 01/25/2016   Chronic pain syndrome 01/25/2016   Prediabetes 10/13/2015   Anxiety about health 09/13/2015   Neuropathy 09/13/2015   Chronic generalized pain 09/13/2015   History of seasonal allergies 09/13/2015   SAH (subarachnoid hemorrhage) (HCC)    Subarachnoid hemorrhage (HCC) 02/04/2015   History of sinus disease on CT 08/10/2014   Hypertension, goal below 150/90 03/17/2014   Health care maintenance 03/17/2014   Tobacco abuse 03/17/2014   Neuropathy, upper extremity bilaterally  08/06/2013    Past Surgical History:  Procedure Laterality Date   CEREBRAL ANEURYSM REPAIR  2016   Yasargil clip x2 Model # [id number]  & [id number]   CHOLECYSTECTOMY     CRANIOTOMY Right 02/04/2015   Procedure: RIGHT CRANIOTOMY FOR ANEURYSM ;  Surgeon: Augusto Blonder, MD;  Location: [facility];  Service: Neurosurgery;  Laterality: Right;   IR GENERIC HISTORICAL  02/04/2015   IR ANGIO INTRA EXTRACRAN SEL COM CAROTID INNOMINATE BILAT MOD SED 02/04/2015 Augusto Blonder, MD [facility]   IR GENERIC HISTORICAL  02/04/2015   IR ANGIO VERTEBRAL SEL SUBCLAVIAN  INNOMINATE UNI R MOD SED 02/04/2015 Augusto Blonder, MD [facility]   IR GENERIC HISTORICAL  02/04/2015   IR ANGIO VERTEBRAL SEL VERTEBRAL UNI L MOD SED 02/04/2015 Augusto Blonder, MD [facility]   TOTAL ABDOMINAL HYSTERECTOMY      OB History   No obstetric history on file.      Home Medications    Prior to Admission medications    Medication Sig Start Date End Date Taking? Authorizing Provider  albuterol  (VENTOLIN  HFA) 108 (90 Base) MCG/ACT inhaler Inhale 1-2 puffs into the lungs every 6 (six) hours as needed for wheezing or shortness of breath. 11/22/22  Yes Raspet, Erin K, PA-C  amLODipine  (NORVASC ) 10 MG tablet TAKE 1 TABLET DAILY TO LOWER BLOOD PRESSURE 03/05/23  Yes Lawrance Presume, MD  Blood Glucose Monitoring Suppl (ONETOUCH VERIO) w/Device KIT Use to test blood sugar once daily. E11.9 10/18/23  Yes McClung, Angela M, PA-C  cetirizine  (ZYRTEC ) 10 MG tablet Take 1 tablet (10 mg total) by mouth daily. 03/08/23  Yes Hassie Lint, PA-C  citalopram  (CELEXA ) 10 MG tablet Take 1 tablet (10 mg total) by mouth daily. 03/05/23  Yes Lawrance Presume, MD  fluticasone  (FLONASE ) 50 MCG/ACT nasal spray Place 1 spray into both nostrils daily. Patient taking differently: Place 1 spray into both nostrils daily as needed for allergies. 11/22/22  Yes Raspet, Erin K, PA-C  gabapentin  (NEURONTIN ) 300 MG capsule Take 1 capsule (300 mg total) by mouth 3 (three) times daily. 10/18/23  Yes Dulce Gibbs M, PA-C  glucose blood (ONETOUCH VERIO) test strip Use to check blood sugar once daily. E11.9 10/18/23  Yes Hassie Lint, PA-C  Lancet Devices (ONETOUCH DELICA PLUS LANCING) MISC Use to check blood sugar once daily. E11.9 10/18/23  Yes McClung, Angela M, PA-C  nitrofurantoin , macrocrystal-monohydrate, (MACROBID ) 100 MG capsule Take 1 capsule (100 mg total) by mouth 2 (two) times daily. 05/22/24  Yes Maryruth Sol, FNP  trolamine salicylate (ASPERCREME) 10 % cream Apply 1 application topically as needed for muscle pain (or sciatica).    Yes [provider]  levETIRAcetam  (KEPPRA ) 250 MG tablet Take 1 tablet (250 mg total) by mouth 2 (two) times daily. Patient not taking: Reported on 05/22/2024 09/18/23   Lisabeth Rider, MD  OneTouch Delica Lancets 33G MISC Test blood sugars twice daily. E11.9 10/18/23   Lawrance Presume, MD     Family History Family History  Problem Relation Age of Onset   Diabetes Mother    Heart failure Mother    Cancer Father     Social History Social History   Tobacco Use   Smoking status: Some Days    Current packs/day: 0.00    Types: Cigarettes   Smokeless tobacco: Never   Tobacco comments:    reports 1 pack per month  Vaping Use   Vaping status: Never Used  Substance Use Topics   Alcohol use: Yes    Comment: Rarely.   Drug use: No     Allergies   Demerol and Morphine and codeine    Review of Systems Review of Systems  Constitutional:  Negative for fever.  Gastrointestinal:  Negative for nausea and vomiting.  Genitourinary:  Negative for dysuria, frequency and vaginal discharge.       Odor and cloudy urine   Musculoskeletal:  Negative for back pain.  All other systems reviewed and are negative.    Physical Exam Triage Vital Signs ED Triage Vitals [05/22/24 1319]  Encounter Vitals Group  BP (!) 166/68     Systolic BP Percentile      Diastolic BP Percentile      Pulse Rate 65     Resp 16     Temp 97.7 F (36.5 C)     Temp Source Oral     SpO2 97 %     Weight      Height      Head Circumference      Peak Flow      Pain Score 0     Pain Loc      Pain Education      Exclude from Growth Chart    No data found.  Updated Vital Signs BP (!) 166/68 (BP Location: Left Arm)   Pulse 65   Temp 97.7 F (36.5 C) (Oral)   Resp 16   SpO2 97%   Visual Acuity Right Eye Distance:   Left Eye Distance:   Bilateral Distance:    Right Eye Near:   Left Eye Near:    Bilateral Near:     Physical Exam Constitutional:      General: She is not in acute distress.    Appearance: Normal appearance. She is not ill-appearing, toxic-appearing or diaphoretic.  HENT:     Head: Normocephalic.     Nose: Nose normal.     Mouth/Throat:     Mouth: Mucous membranes are moist.  Eyes:     Conjunctiva/sclera: Conjunctivae normal.  Cardiovascular:     Rate and  Rhythm: Normal rate.  Pulmonary:     Effort: Pulmonary effort is normal.  Abdominal:     Palpations: Abdomen is soft.  Genitourinary:    Comments: Deferred Musculoskeletal:        General: Normal range of motion.     Cervical back: Normal range of motion and neck supple.  Skin:    General: Skin is warm and dry.  Neurological:     General: No focal deficit present.     Mental Status: She is alert and oriented to person, place, and time.  Psychiatric:        Mood and Affect: Mood normal.        Behavior: Behavior normal.      UC Treatments / Results  Labs (all labs ordered are listed, but only abnormal results are displayed) Labs Reviewed  POCT URINALYSIS DIP (MANUAL ENTRY) - Abnormal; Notable for the following components:      Result Value   Blood, UA trace-lysed (*)    Nitrite, UA Positive (*)    All other components within normal limits  URINE CULTURE    EKG   Radiology No results found.  Procedures Procedures (including critical care time)  Medications Ordered in UC Medications - No data to display  Initial Impression / Assessment and Plan / UC Course  I have reviewed the triage vital signs and the nursing notes.  Pertinent labs & imaging results that were available during my care of the patient were reviewed by me and considered in my medical decision making (see chart for details).     Patient presents with concern for urinary tract infection, primarily reporting a strong or unpleasant urine odor. She denies dysuria, urinary frequency, or urgency. She has increased her water and cranberry juice intake in response to symptoms. Urinalysis reveals nitrites and trace blood without the presence of white blood cells. Given the patient's post-menopausal status, the absence of typical UTI symptoms is not unusual. The combination of odor change and urinalysis  findings supports a diagnosis of an uncomplicated urinary tract infection. Nitrofurantoin  is prescribed twice  daily for five days. Urine has been sent for culture to confirm bacterial organism and sensitivity. The patient is advised to continue increased hydration and cranberry juice, complete the full antibiotic course unless contacted with alternate instructions, and review culture results via MyChart. Follow-up with primary care is recommended as needed.  Today's evaluation has revealed no signs of a dangerous process. Discussed diagnosis with patient and/or guardian. Patient and/or guardian aware of their diagnosis, possible red flag symptoms to watch out for and need for close follow up. Patient and/or guardian understands verbal and written discharge instructions. Patient and/or guardian comfortable with plan and disposition.  Patient and/or guardian has a clear mental status at this time, good insight into illness (after discussion and teaching) and has clear judgment to make decisions regarding their care  Documentation was completed with the aid of voice recognition software. Transcription may contain typographical errors. Final Clinical Impressions(s) / UC Diagnoses   Final diagnoses:  Acute cystitis with hematuria     Discharge Instructions      You were evaluated today for concerns related to a possible urinary tract infection. Your main symptom was a strong or unusual odor to your urine. You did not report any pain, burning, or increased urgency or frequency with urination. A urinalysis showed the presence of nitrites and trace blood, which can be associated with a urinary tract infection, especially in post-menopausal women where typical symptoms may be less noticeable.  You have been prescribed nitrofurantoin  to take twice daily for five days. It is important to complete the full course of antibiotics, even if your symptoms improve before you finish the medication. You should continue drinking plenty of water and may also drink cranberry juice, which can help support urinary tract health. A  urine sample was sent for culture to confirm the specific bacteria and ensure the antibiotic is appropriate. You will only be contacted if a change in treatment is necessary. You can also review your test results in MyChart.  If you experience worsening symptoms such as fever, chills, back pain, blood in the urine, or increased pain with urination, you should seek care immediately. Follow up with your primary care provider if your symptoms do not improve within a few days, or if you have any new or concerning symptoms. Seek emergency care if you develop high fever, severe back or abdominal pain, or signs of confusion or weakness.    ED Prescriptions     Medication Sig Dispense Auth. Provider   nitrofurantoin , macrocrystal-monohydrate, (MACROBID ) 100 MG capsule Take 1 capsule (100 mg total) by mouth 2 (two) times daily. 10 capsule Maryruth Sol, FNP      PDMP not reviewed this encounter.   Maryruth Sol, [place] 05/22/24 [phone]

## 2024-05-22 NOTE — Telephone Encounter (Signed)
 Attempt to call pt to notify of antibiotic change. NA/LVM

## 2024-05-22 NOTE — Telephone Encounter (Signed)
 Pt updated the antibiotic had been changed to Bactrim  and sent to pharmacy on file. She verbalized understanding.

## 2024-05-22 NOTE — ED Triage Notes (Signed)
 Pt reports urine has odor and is cloudy x 1 week. She is concerned for UTI

## 2024-05-22 NOTE — Telephone Encounter (Signed)
 Message from patient access:  MRN: [id number] was just seen , currently at the pharmacy and was told the medication prescribed is not available and will not be available for another week. if someone could update that and give her a call please  Please advise

## 2024-05-22 NOTE — Discharge Instructions (Addendum)
 You were evaluated today for concerns related to a possible urinary tract infection. Your main symptom was a strong or unusual odor to your urine. You did not report any pain, burning, or increased urgency or frequency with urination. A urinalysis showed the presence of nitrites and trace blood, which can be associated with a urinary tract infection, especially in post-menopausal women where typical symptoms may be less noticeable.  You have been prescribed nitrofurantoin  to take twice daily for five days. It is important to complete the full course of antibiotics, even if your symptoms improve before you finish the medication. You should continue drinking plenty of water and may also drink cranberry juice, which can help support urinary tract health. A urine sample was sent for culture to confirm the specific bacteria and ensure the antibiotic is appropriate. You will only be contacted if a change in treatment is necessary. You can also review your test results in MyChart.  If you experience worsening symptoms such as fever, chills, back pain, blood in the urine, or increased pain with urination, you should seek care immediately. Follow up with your primary care provider if your symptoms do not improve within a few days, or if you have any new or concerning symptoms. Seek emergency care if you develop high fever, severe back or abdominal pain, or signs of confusion or weakness.

## 2024-05-24 LAB — URINE CULTURE: Culture: 30000 — AB

## 2024-05-26 ENCOUNTER — Ambulatory Visit (HOSPITAL_COMMUNITY): Payer: Self-pay

## 2024-07-14 ENCOUNTER — Telehealth: Payer: Self-pay

## 2024-07-14 NOTE — Telephone Encounter (Signed)
 Copied from CRM [id number]. Topic: Appointments - Scheduling Inquiry for Clinic >> Jul 14, 2024  3:45 PM Willma R wrote: Reason for CRM: Patient called in to see if she was scheduled to come in any time soon. Advised she has no upcoming appointments scheduled. Says she is not sure if she needs to be seen for anything or have lab work completed. Would like someone to confirm with Dr Vicci and get back with her.  Patient can be reached at [phone]

## 2024-07-17 NOTE — Telephone Encounter (Signed)
 Noted

## 2024-08-07 ENCOUNTER — Telehealth: Payer: Self-pay | Admitting: Internal Medicine

## 2024-08-07 NOTE — Telephone Encounter (Signed)
 Confirmed appt for 8/22

## 2024-08-08 ENCOUNTER — Ambulatory Visit: Payer: Medicare (Managed Care) | Attending: Internal Medicine | Admitting: Internal Medicine

## 2024-08-08 ENCOUNTER — Encounter: Payer: Self-pay | Admitting: Internal Medicine

## 2024-08-08 VITALS — BP 169/70 | HR 68 | Temp 98.5°F | Ht <= 58 in | Wt 153.0 lb

## 2024-08-08 DIAGNOSIS — F320 Major depressive disorder, single episode, mild: Secondary | ICD-10-CM

## 2024-08-08 DIAGNOSIS — R195 Other fecal abnormalities: Secondary | ICD-10-CM

## 2024-08-08 DIAGNOSIS — M25511 Pain in right shoulder: Secondary | ICD-10-CM

## 2024-08-08 DIAGNOSIS — Z1231 Encounter for screening mammogram for malignant neoplasm of breast: Secondary | ICD-10-CM

## 2024-08-08 DIAGNOSIS — Z7901 Long term (current) use of anticoagulants: Secondary | ICD-10-CM

## 2024-08-08 DIAGNOSIS — E1159 Type 2 diabetes mellitus with other circulatory complications: Secondary | ICD-10-CM | POA: Diagnosis not present

## 2024-08-08 DIAGNOSIS — R400 Somnolence: Secondary | ICD-10-CM

## 2024-08-08 DIAGNOSIS — Z780 Asymptomatic menopausal state: Secondary | ICD-10-CM | POA: Diagnosis not present

## 2024-08-08 DIAGNOSIS — R0681 Apnea, not elsewhere classified: Secondary | ICD-10-CM | POA: Diagnosis not present

## 2024-08-08 DIAGNOSIS — Z2821 Immunization not carried out because of patient refusal: Secondary | ICD-10-CM

## 2024-08-08 DIAGNOSIS — I152 Hypertension secondary to endocrine disorders: Secondary | ICD-10-CM | POA: Diagnosis not present

## 2024-08-08 DIAGNOSIS — G8929 Other chronic pain: Secondary | ICD-10-CM | POA: Diagnosis not present

## 2024-08-08 LAB — POCT GLYCOSYLATED HEMOGLOBIN (HGB A1C): HbA1c, POC (controlled diabetic range): 6 % (ref 0.0–7.0)

## 2024-08-08 LAB — GLUCOSE, POCT (MANUAL RESULT ENTRY): POC Glucose: 101 mg/dL — AB (ref 70–99)

## 2024-08-08 MED ORDER — AMLODIPINE BESYLATE 10 MG PO TABS: ORAL_TABLET | ORAL | 1 refills | Status: AC

## 2024-08-08 MED ORDER — VALSARTAN 40 MG PO TABS: 40.0000 mg | ORAL_TABLET | Freq: Every day | ORAL | 3 refills | Status: AC

## 2024-08-08 MED ORDER — SERTRALINE HCL 25 MG PO TABS: 25.0000 mg | ORAL_TABLET | Freq: Every day | ORAL | 3 refills | Status: DC

## 2024-08-08 NOTE — Progress Notes (Signed)
 Patient ID: Renee Murray, female    DOB: 21-Aug-1956  MRN: [id number]  CC: Diabetes (DM f/u. Med refills. /)   Subjective: Renee Murray is a 68 y.o. female who presents for chronic ds management. Her concerns today include:  DM, HTN, SAH (aneurysm) 2016, tob dep, chronic pain, mod/sev CTS RT s/p release, focal sz   Discussed the use of AI scribe software for clinical note transcription with the patient, who gave verbal consent to proceed.  History of Present Illness Renee Murray is a 68 year old female who presents for chronic ds management  MDD: She has been experiencing significant stress and symptoms of depression following the death of her sister in 2025-03-24and her brother's leg and foot amputation in March 2025. She feels overwhelmed by family responsibilities, including caring for a grandson with post-traumatic stress disorder and schizophrenia, who occasionally stays with her. Her grandson's non-compliance with his psychiatric medications adds to her stress.  She describes symptoms such as poor appetite, irregular sleep patterns, and excessive sleepiness during the day. She previously tried Celexa  but discontinued it due to headaches and lack of improvement in her symptoms. She is interested in trying a different medication for her depression.  DM Results for orders placed or performed in visit on 08/08/24  POCT glycosylated hemoglobin (Hb A1C)   Collection Time: 08/08/24  9:40 AM  Result Value Ref Range   Hemoglobin A1C     HbA1c POC (<> result, manual entry)     HbA1c, POC (prediabetic range)     HbA1c, POC (controlled diabetic range) 6.0 0.0 - 7.0 %  POCT glucose (manual entry)   Collection Time: 08/08/24  9:40 AM  Result Value Ref Range   POC Glucose 101 (A) 70 - 99 mg/dl   She is concerned about her diabetes management and her eating habits. She admits to inconsistent eating habits, often having only one meal a day, and is concerned about weight gain  around her waist. She is active during the day, engaging in activities such as yard work, but lacks motivation for additional exercise.  HTN: On Norvasc  10 mg daily which she endorses taking consistently took already for the morning.  She uses salt sparingly and occasionally consumes sugary drinks. No chest pain or shortness of breath  She reports finding herself waking up sometimes gasping for air.  Daughter sas told her that she stops breathing for several seconds during her sleep and snores loud.  Denies morning headaches.  She does not wake feeling refreshed and endorses daytime sleepiness.  She reports chronic right shoulder pain for several months, which affects her sleep and daily activities. She uses [facility] for relief and has had previous x-rays 12/2023 that did not show significant findings.  She does not feel that the shoulder is getting any better.  Hospitalized September of last year for possible TIA.  She was assessed by neurology to have had possible focal seizure and was started on Keppra .  She saw a neurologist Dr. Rosemarie in follow-up in October.  EEG was normal his assessment was that her left-sided neck pain, paresthesias could be due to cervical radiculopathy rather than partial seizures.  Keppra  was d/c.   HM: declines shingles and flu vaccines.  She is agreeable to having bone density study mammogram and referral to gastroenterology for colon cancer screening.  She had positive Cologuard test in 2022 impressing upon her since then colonoscopy done.  Thinks one of her daughters would be  able to accompany her for the procedure. Patient Active Problem List   Diagnosis Date Noted   Seizure (HCC) 09/06/2023   TIA (transient ischemic attack) 09/05/2023   Thrombocytopenia (HCC) 09/05/2023   Brain aneurysm 09/05/2023   Obesity (BMI 30-39.9) 09/05/2023   Positive colorectal cancer screening using Cologuard test 11/18/2021   Type 2 diabetes mellitus without complication, without  long-term current use of insulin (HCC) 07/19/2021   Cellulitis of left lower extremity 06/01/2021   Acute back pain with sciatica, right 05/31/2021   Dermatitis 05/31/2021   Right sciatic nerve pain 05/30/2021   Rash and nonspecific skin eruption 05/30/2021   Acute midline low back pain 12/27/2016   Acute pain of both shoulders 12/27/2016   Rotator cuff impingement syndrome 12/27/2016   Essential hypertension 01/25/2016   Chronic pain syndrome 01/25/2016   Prediabetes 10/13/2015   Anxiety about health 09/13/2015   Neuropathy 09/13/2015   Chronic generalized pain 09/13/2015   History of seasonal allergies 09/13/2015   SAH (subarachnoid hemorrhage) (HCC)    Subarachnoid hemorrhage (HCC) 02/04/2015   History of sinus disease on CT 08/10/2014   Hypertension, goal below 150/90 03/17/2014   Health care maintenance 03/17/2014   Tobacco abuse 03/17/2014   Neuropathy, upper extremity bilaterally  08/06/2013     Current Outpatient Medications on File Prior to Visit  Medication Sig Dispense Refill   albuterol  (VENTOLIN  HFA) 108 (90 Base) MCG/ACT inhaler Inhale 1-2 puffs into the lungs every 6 (six) hours as needed for wheezing or shortness of breath. 18 g 0   Blood Glucose Monitoring Suppl (ONETOUCH VERIO) w/Device KIT Use to test blood sugar once daily. E11.9 1 kit 0   cetirizine  (ZYRTEC ) 10 MG tablet Take 1 tablet (10 mg total) by mouth daily. 90 tablet 1   fluticasone  (FLONASE ) 50 MCG/ACT nasal spray Place 1 spray into both nostrils daily. (Patient taking differently: Place 1 spray into both nostrils daily as needed for allergies.) 16 g 0   gabapentin  (NEURONTIN ) 300 MG capsule Take 1 capsule (300 mg total) by mouth 3 (three) times daily. 270 capsule 1   glucose blood (ONETOUCH VERIO) test strip Use to check blood sugar once daily. E11.9 100 each 6   Lancet Devices (ONETOUCH DELICA PLUS LANCING) MISC Use to check blood sugar once daily. E11.9 1 each 6   OneTouch Delica Lancets 33G MISC Test  blood sugars twice daily. E11.9 100 each 11   trolamine salicylate (ASPERCREME) 10 % cream Apply 1 application topically as needed for muscle pain (or sciatica).      levETIRAcetam  (KEPPRA ) 250 MG tablet Take 1 tablet (250 mg total) by mouth 2 (two) times daily. (Patient not taking: Reported on 08/08/2024) 60 tablet 5   No current facility-administered medications on file prior to visit.    Allergies  Allergen Reactions   Demerol Other (See Comments)    I almost died   Morphine And Codeine  Other (See Comments)    Almost Died   Celexa  [Citalopram ]     Headache    Social History   Socioeconomic History   Marital status: Divorced    Spouse name: Not on file   Number of children: 3   Years of education: Not on file   Highest education level: High school graduate  Occupational History   Occupation: retired  Tobacco Use   Smoking status: Some Days    Current packs/day: 0.00    Types: Cigarettes   Smokeless tobacco: Never   Tobacco comments:    reports  1 pack per month  Vaping Use   Vaping status: Never Used  Substance and Sexual Activity   Alcohol use: Yes    Comment: Rarely.   Drug use: No   Sexual activity: Not Currently  Other Topics Concern   Not on file  Social History Narrative   Lives alone   Right handed   Drinks 3-4 cups of caffeine daily   Social Drivers of Health   Financial Resource Strain: Not on file  Food Insecurity: No Food Insecurity (09/05/2023)   Hunger Vital Sign    Worried About Running Out of Food in the Last Year: Never true    Ran Out of Food in the Last Year: Never true  Transportation Needs: No Transportation Needs (09/05/2023)   PRAPARE - Administrator, Civil Service (Medical): No    Lack of Transportation (Non-Medical): No  Physical Activity: Not on file  Stress: Not on file  Social Connections: Not on file  Intimate Partner Violence: Not At Risk (09/05/2023)   Humiliation, Afraid, Rape, and Kick questionnaire    Fear  of Current or Ex-Partner: No    Emotionally Abused: No    Physically Abused: No    Sexually Abused: No    Family History  Problem Relation Age of Onset   Diabetes Mother    Heart failure Mother    Cancer Father     Past Surgical History:  Procedure Laterality Date   CEREBRAL ANEURYSM REPAIR  2016   Yasargil clip x2 Model # [id number]  & [phone]   CHOLECYSTECTOMY     CRANIOTOMY Right 02/04/2015   Procedure: RIGHT CRANIOTOMY FOR ANEURYSM ;  Surgeon: Gerldine Maizes, MD;  Location: [facility];  Service: Neurosurgery;  Laterality: Right;   IR GENERIC HISTORICAL  02/04/2015   IR ANGIO INTRA EXTRACRAN SEL COM CAROTID INNOMINATE BILAT MOD SED 02/04/2015 Gerldine Maizes, MD [facility]   IR GENERIC HISTORICAL  02/04/2015   IR ANGIO VERTEBRAL SEL SUBCLAVIAN INNOMINATE UNI R MOD SED 02/04/2015 Gerldine Maizes, MD [facility]   IR GENERIC HISTORICAL  02/04/2015   IR ANGIO VERTEBRAL SEL VERTEBRAL UNI L MOD SED 02/04/2015 Gerldine Maizes, MD [facility]   TOTAL ABDOMINAL HYSTERECTOMY      ROS: Review of Systems Negative except as stated above  PHYSICAL EXAM: BP (!) 169/70   Pulse 68   Temp 98.5 F (36.9 C) (Oral)   Ht 4' 10 (1.473 m)   Wt 153 lb (69.4 kg)   SpO2 97%   BMI 31.98 kg/m   Wt Readings from Last 3 Encounters:  08/08/24 153 lb (69.4 kg)  01/18/24 154 lb 1.6 oz (69.9 kg)  09/18/23 154 lb (69.9 kg)    Physical Exam  General appearance - alert, well appearing, older AAF and in no distress Mental status - normal mood, behavior, speech, dress, motor activity, and thought processes Eyes: Slightly pale conjunctiva. Mouth - mucous membranes moist, pharynx normal without lesions Neck - supple, no significant adenopathy Chest - clear to auscultation, no wheezes, rales or rhonchi, symmetric air entry Heart - normal rate, regular rhythm, normal S1, S2, no murmurs, rubs, clicks or gallops Musculoskeletal -right shoulder: No tenderness on palpation of the anterior or  posterior joint line.  She has moderate discomfort with attempted passive range of motion.  Drop arm test positive. Left shoulder: Mild discomfort with passive range of motion.  Drop arm test negative. Extremities - peripheral pulses normal, no pedal edema, no clubbing or cyanosis  08/08/2024    9:37 AM 03/08/2023    3:16 PM 03/05/2023    2:45 PM  Depression screen PHQ 2/9  Decreased Interest 3 1 0  Down, Depressed, Hopeless 3 0 0  PHQ - 2 Score 6 1 0  Altered sleeping 3 0 2  Tired, decreased energy 2 1 1   Change in appetite 2 1 1   Feeling bad or failure about yourself  0 0 0  Trouble concentrating 1 0 1  Moving slowly or fidgety/restless 1 0 0  Suicidal thoughts 0 0 0  PHQ-9 Score 15 3 5   Difficult doing work/chores Somewhat difficult        08/08/2024    9:38 AM 03/08/2023    3:16 PM 11/18/2021   11:51 AM 07/19/2021    1:42 PM  GAD 7 : Generalized Anxiety Score  Nervous, Anxious, on Edge 1 1 1 1   Control/stop worrying 0 0 0 1  Worry too much - different things 1 0 1 1  Trouble relaxing 1 0 1 1  Restless 0 0 0 1  Easily annoyed or irritable 0 0 0 1  Afraid - awful might happen 0 0 0 0  Total GAD 7 Score 3 1 3 6   Anxiety Difficulty Somewhat difficult            Latest Ref Rng & Units 09/05/2023    4:45 AM 03/05/2023    2:48 PM 07/19/2021    2:18 PM  CMP  Glucose 70 - 99 mg/dL 98  86    BUN 8 - 23 mg/dL 9  12    Creatinine 9.55 - 1.00 mg/dL 9.35  9.26    Sodium 864 - 145 mmol/L 141  146    Potassium 3.5 - 5.1 mmol/L 4.2  3.8    Chloride 98 - 111 mmol/L 105  108    CO2 22 - 32 mmol/L 25  24    Calcium  8.9 - 10.3 mg/dL 8.9  9.0    Total Protein 6.5 - 8.1 g/dL 6.8  6.2  6.5   Total Bilirubin 0.3 - 1.2 mg/dL 0.6  <9.7  <9.7   Alkaline Phos 38 - 126 U/L 102  107  85   AST 15 - 41 U/L 27  20  17    ALT 0 - 44 U/L 19  15  14     Lipid Panel     Component Value Date/Time   CHOL 139 09/05/2023 0445   CHOL 142 03/05/2023 1448   TRIG 67 09/05/2023 0445   HDL 41  09/05/2023 0445   HDL 41 03/05/2023 1448   CHOLHDL 3.4 09/05/2023 0445   VLDL 13 09/05/2023 0445   LDLCALC 85 09/05/2023 0445   LDLCALC 77 03/05/2023 1448    CBC    Component Value Date/Time   WBC 6.7 09/05/2023 0156   RBC 4.51 09/05/2023 0156   HGB 13.3 09/05/2023 0156   HGB 12.6 03/05/2023 1448   HCT 41.2 09/05/2023 0156   HCT 37.9 03/05/2023 1448   PLT 76 (L) 09/05/2023 0156   PLT 179 03/05/2023 1448   MCV 91.4 09/05/2023 0156   MCV 90 03/05/2023 1448   MCH 29.5 09/05/2023 0156   MCHC 32.3 09/05/2023 0156   RDW 14.5 09/05/2023 0156   RDW 14.1 03/05/2023 1448   LYMPHSABS 3.6 09/05/2023 0156   MONOABS 0.4 09/05/2023 0156   EOSABS 0.4 09/05/2023 0156   BASOSABS 0.1 09/05/2023 0156    ASSESSMENT AND PLAN: 1. Type 2 diabetes mellitus  with other circulatory complications (HCC) (Primary) Diet control.  Discussed and encouraged healthy eating habits.  Encouraged her to continue to try to remain active. - POCT glycosylated hemoglobin (Hb A1C) - POCT glucose (manual entry) - amLODipine  (NORVASC ) 10 MG tablet; TAKE 1 TABLET DAILY TO LOWER BLOOD PRESSURE  Dispense: 90 tablet; Refill: 1 - Ambulatory referral to Ophthalmology - CBC - Comprehensive metabolic panel with GFR - Lipid panel - Microalbumin / creatinine urine ratio  2. Hypertension associated with diabetes (HCC) Not at goal.  Continue Norvasc  10 mg daily.  Diovan  40 mg daily.  DASH diet encouraged. - valsartan  (DIOVAN ) 40 MG tablet; Take 1 tablet (40 mg total) by mouth daily.  Dispense: 90 tablet; Refill: 3  3. Major depressive disorder, single episode, mild (HCC) Discussed depression with CBT plus or minus medication.  Patient would like to try the new medication since she did not tolerate Celexa  but wants to hold off on referral for counseling.  We discussed putting her on Zoloft .  Advised that if there is increased depression or if she develops suicidal ideation on the medicine she should stop it coming.  We will  have her follow-up in 2 months to see how she is doing. - sertraline  (ZOLOFT ) 25 MG tablet; Take 1 tablet (25 mg total) by mouth daily.  Dispense: 30 tablet; Refill: 3  4. Daytime sleepiness Patient with history of witnessed apnea times sleepiness and snoring.  Symptoms suggestive of sleep apnea.  Will refer for home sleep study. - Home sleep test; Future  5. Witnessed episode of apnea - Home sleep test; Future  6. Chronic right shoulder pain - AMB referral to orthopedics  7. Encounter for screening mammogram for malignant neoplasm of breast - MM 3D SCREENING MAMMOGRAM BILATERAL BREAST; Future  8. Positive colorectal cancer screening using Cologuard test - Ambulatory referral to Gastroenterology  9. Postmenopausal estrogen deficiency - DG Bone Density; Future  10. Herpes zoster vaccination declined  11. Influenza vaccination declined    Patient was given the opportunity to ask questions.  Patient verbalized understanding of the plan and was able to repeat key elements of the plan.   This documentation was completed using Paediatric nurse.  Any transcriptional errors are unintentional.  Orders Placed This Encounter  Procedures   MM 3D SCREENING MAMMOGRAM BILATERAL BREAST   DG Bone Density   CBC   Comprehensive metabolic panel with GFR   Lipid panel   Microalbumin / creatinine urine ratio   Ambulatory referral to Ophthalmology   Ambulatory referral to Gastroenterology   AMB referral to orthopedics   POCT glycosylated hemoglobin (Hb A1C)   POCT glucose (manual entry)   Home sleep test     Requested Prescriptions   Signed Prescriptions Disp Refills   amLODipine  (NORVASC ) 10 MG tablet 90 tablet 1    Sig: TAKE 1 TABLET DAILY TO LOWER BLOOD PRESSURE   sertraline  (ZOLOFT ) 25 MG tablet 30 tablet 3    Sig: Take 1 tablet (25 mg total) by mouth daily.   valsartan  (DIOVAN ) 40 MG tablet 90 tablet 3    Sig: Take 1 tablet (40 mg total) by mouth daily.     Return in about 2 months (around 10/08/2024).  Barnie Louder, MD, FACP

## 2024-08-08 NOTE — Patient Instructions (Signed)
 VISIT SUMMARY:  Today, you had a routine wellness visit where we discussed your overall health, including your symptoms of depression, stress, and other health concerns. We reviewed your current medications and made some adjustments to better manage your conditions.  YOUR PLAN:  -DEPRESSION: Depression is a mental health condition characterized by persistent feelings of sadness and loss of interest. We discussed starting you on Zoloft  25 mg daily since Celexa  was not effective for you. Please monitor for any increase in depression or anxiety and report any thoughts of self-harm immediately.  -HYPERTENSION: Hypertension, or high blood pressure, is a condition where the force of the blood against your artery walls is too high. Your blood pressure was elevated today, so we are adding valsartan  40 mg once daily to your current amlodipine  to help achieve a target blood pressure of 130/80 or lower. Please continue to monitor your blood pressure.  -TYPE 2 DIABETES MELLITUS WITHOUT COMPLICATIONS: Type 2 diabetes is a condition that affects the way your body processes blood sugar. Your diabetes is well-controlled with a current A1c of 6.0 and blood sugar of 101. Continue with your current dietary management and try to incorporate regular exercise to help with stress and weight management.  -SUSPECTED SLEEP APNEA: Sleep apnea is a sleep disorder where breathing repeatedly stops and starts. Your symptoms suggest you might have sleep apnea, so we are ordering a home sleep study to confirm this.  -RIGHT SHOULDER PAIN: Chronic right shoulder pain can affect your daily activities and sleep. We will order a new x-ray and refer you to orthopedics for further evaluation. In the meantime, you can take Tylenol  as needed for pain relief.  -OBESITY: Obesity is a condition characterized by excessive body fat. Your weight has been stable, but we discussed the importance of regular exercise and reducing sugary drinks and  snacks to help manage your weight and stress.  -GENERAL HEALTH MAINTENANCE: We discussed routine health maintenance, including screenings and lifestyle modifications. You declined shingles and flu vaccines. We will submit referrals for a mammogram, diabetic eye exam, and bone density study. Blood tests for cholesterol, kidney, and liver function, as well as a urine sample to check for protein, have been ordered.  INSTRUCTIONS:  Please follow up with the referrals for your mammogram, diabetic eye exam, and bone density study. Complete the blood tests and urine sample as ordered. Monitor your blood pressure regularly and report any significant changes or concerns. Schedule a follow-up appointment to review the results of your home sleep study and shoulder x-ray.

## 2024-08-09 ENCOUNTER — Ambulatory Visit: Payer: Self-pay | Admitting: Internal Medicine

## 2024-08-09 LAB — CBC
Hematocrit: 42 % (ref 34.0–46.6)
Hemoglobin: 13.6 g/dL (ref 11.1–15.9)
MCH: 30.4 pg (ref 26.6–33.0)
MCHC: 32.4 g/dL (ref 31.5–35.7)
MCV: 94 fL (ref 79–97)
Platelets: 185 x10E3/uL (ref 150–450)
RBC: 4.48 x10E6/uL (ref 3.77–5.28)
RDW: 14.5 % (ref 11.7–15.4)
WBC: 6.7 x10E3/uL (ref 3.4–10.8)

## 2024-08-09 LAB — COMPREHENSIVE METABOLIC PANEL WITH GFR
ALT: 17 IU/L (ref 0–32)
AST: 19 IU/L (ref 0–40)
Albumin: 4.1 g/dL (ref 3.9–4.9)
Alkaline Phosphatase: 128 IU/L — ABNORMAL HIGH (ref 44–121)
BUN/Creatinine Ratio: 18 (ref 12–28)
BUN: 12 mg/dL (ref 8–27)
Bilirubin Total: <0.2 mg/dL (ref 0.0–1.2)
CO2: 24 mmol/L (ref 20–29)
Calcium: 9.2 mg/dL (ref 8.7–10.3)
Chloride: 103 mmol/L (ref 96–106)
Creatinine, Ser: 0.67 mg/dL (ref 0.57–1.00)
Globulin, Total: 2.5 g/dL (ref 1.5–4.5)
Glucose: 108 mg/dL — ABNORMAL HIGH (ref 70–99)
Potassium: 4.4 mmol/L (ref 3.5–5.2)
Sodium: 141 mmol/L (ref 134–144)
Total Protein: 6.6 g/dL (ref 6.0–8.5)
eGFR: 94 mL/min/1.73 (ref >59)

## 2024-08-09 LAB — LIPID PANEL
Chol/HDL Ratio: 3.6 ratio (ref 0.0–4.4)
Cholesterol, Total: 160 mg/dL (ref 100–199)
HDL: 45 mg/dL (ref >39)
LDL Chol Calc (NIH): 88 mg/dL (ref 0–99)
Triglycerides: 156 mg/dL — ABNORMAL HIGH (ref 0–149)
VLDL Cholesterol Cal: 27 mg/dL (ref 5–40)

## 2024-08-10 LAB — MICROALBUMIN / CREATININE URINE RATIO
Creatinine, Urine: 63.2 mg/dL
Microalb/Creat Ratio: 7 mg/g{creat} (ref 0–29)
Microalbumin, Urine: 4.7 ug/mL

## 2024-08-11 ENCOUNTER — Ambulatory Visit: Payer: Self-pay

## 2024-08-11 ENCOUNTER — Telehealth: Payer: Self-pay

## 2024-08-11 MED ORDER — ROSUVASTATIN CALCIUM 5 MG PO TABS: 5.0000 mg | ORAL_TABLET | Freq: Every day | ORAL | 1 refills | Status: DC

## 2024-08-11 NOTE — Telephone Encounter (Signed)
 Copied from CRM [id number]. Topic: Clinical - Medication Question >> Aug 11, 2024  9:00 AM Delon HERO wrote: Reason for CRM: Patient is calling to report that she started taking valsartan  (DIOVAN ) 40 MG tablet [[id number]] 08/08/2024. And her BP has inconsistent readings of 8/22-140/65 08/09/24 147/69 08/10/24 123/58   Attempted to returned pt's call: no answer: left voicemail.

## 2024-08-11 NOTE — Telephone Encounter (Signed)
 Continue current dose of amlodipine  10 mg daily and valsartan  40 mg daily.  Given appointment with the clinical pharmacist in 3 weeks for recheck of blood pressure.

## 2024-08-11 NOTE — Telephone Encounter (Signed)
 Copied from CRM #[id number]. Topic: Clinical - Lab/Test Results >> Aug 11, 2024  9:08 AM Delon HERO wrote: Reason for CRM: patietnt is calling to receive lab results. Pateint is in agreement to [id number] - [place], [place] - [address] [place] [address] Phone: [phone] Fax: [phone] Hours: Not open 24 hours

## 2024-08-11 NOTE — Addendum Note (Signed)
 Addended by: VICCI SOBER B on: 08/11/2024 06:34 PM   Modules accepted: Orders

## 2024-08-11 NOTE — Telephone Encounter (Signed)
  FYI Only or Action Required?: Action required by provider: clinical question for provider and update on patient condition.  Patient was last seen in primary care on 08/08/2024 by Vicci Barnie NOVAK, MD.  Called Nurse Triage reporting blood pressure.  Symptoms began several days ago.  Interventions attempted: Nothing.  Symptoms are: unchanged.  Triage Disposition: See PCP When Office is Open (Within 3 Days)  Patient/caregiver understands and will follow disposition?: Yes Copied from CRM #[id number]. Topic: Clinical - Medication Question >> Aug 11, 2024  9:00 AM Delon HERO wrote: Reason for CRM: Patient is calling to report that she started taking valsartan  (DIOVAN ) 40 MG tablet [[id number]] 08/08/2024. And her BP has inconsistent readings of 8/22-140/65 08/09/24 147/69 08/10/24 123/58     Attempted to returned pt's call: no answer: left voicemail.     Reason for Disposition  [1] Fall in systolic BP > 20 mm Hg from normal AND [2] NOT feeling weak or lightheaded  Answer Assessment - Initial Assessment Questions Patient recently started Valsartan  calling with concerns of lowering blood pressure.  No symptoms of hypotension  Lab results read from chart per Dr. Vicci, patient verbalizes understanding   Barnie NOVAK Vicci, MD 08/11/2024  7:10 AM EDT Back to Top  No abnormal amount of protein in the urine.  Barnie NOVAK Vicci, MD 08/09/2024 10:16 AM EDT   LDL cholesterol level is 88 with goal being less than 70.  High cholesterol increases risk for heart attack and strokes.  I recommend starting a low-dose of cholesterol-lowering medication called rosuvastatin  to help lower cholesterol.  Me know if patient is willing to take the medicine so that I can send prescription to her pharmacy. Kidney and liver function tests are good. Blood cell counts are normal.     1. BLOOD PRESSURE: What is your blood pressure? Did you take at least two measurements 5 minutes apart?     121/63, 71  pulse 2. ONSET: When did you take your blood pressure?     Blood pressures trending down since she started valsartan  on 08/08/2024 3. HOW: How did you take your blood pressure? (e.g., visiting nurse, automatic home BP monitor)     Automatic cuff 4. HISTORY: Do you have a history of low blood pressure? What is your blood pressure normally?     History of HTN, recently started on valsartan  5. MEDICINES: Are you taking any medicines for blood pressure? If Yes, ask: Have they been changed recently?     Newly started valsartan  6. PULSE RATE: Do you know what your pulse rate is?      71 7. OTHER SYMPTOMS: Have you been sick recently? Have you had a recent injury?     deneis 8. PREGNANCY: Is there any chance you are pregnant? When was your last menstrual period?     N/A  Protocols used: Blood Pressure - Low-A-AH

## 2024-08-11 NOTE — Telephone Encounter (Signed)
 FYI

## 2024-08-12 ENCOUNTER — Telehealth: Payer: Self-pay | Admitting: Sports Medicine

## 2024-08-12 NOTE — Telephone Encounter (Signed)
 Called but no answer. LVM to call back.

## 2024-08-12 NOTE — Telephone Encounter (Signed)
 Called and and was unable to leave vm. Pt phone just ended call and no vm recording. Pt need appt for right shoulder pains with [facility]. Please attach referral

## 2024-08-13 ENCOUNTER — Telehealth: Payer: Self-pay

## 2024-08-13 NOTE — Telephone Encounter (Signed)
 Called but no answer. LVM to call back.

## 2024-08-13 NOTE — Telephone Encounter (Signed)
 Copied from CRM [id number]. Topic: Clinical - Medication Question >> Aug 13, 2024 10:39 AM Marissa P wrote: Reason for CRM: Concerned about medication for Cholesterol, side effects are concerning and wanted to see other options that has less side effects. Can doctor or nurse call her back please as soon as possible.

## 2024-08-13 NOTE — Telephone Encounter (Signed)
 Call to patient unable to reach VM left

## 2024-08-14 NOTE — Telephone Encounter (Signed)
 FYI. Third attempt. Called but no answer. LVM to call back.

## 2024-08-19 ENCOUNTER — Telehealth: Payer: Self-pay | Admitting: Sports Medicine

## 2024-08-19 NOTE — Telephone Encounter (Signed)
 Called and left 3x vm for pt to call and set an appt with [facility] for shoulder pains. Please attach referral

## 2024-08-22 ENCOUNTER — Ambulatory Visit
Admission: RE | Admit: 2024-08-22 | Discharge: 2024-08-22 | Disposition: A | Discharge status: Home / Self Care | Payer: Medicare (Managed Care) | Source: Ambulatory Visit | Attending: Internal Medicine | Admitting: Internal Medicine

## 2024-08-22 DIAGNOSIS — Z1231 Encounter for screening mammogram for malignant neoplasm of breast: Secondary | ICD-10-CM

## 2024-09-10 ENCOUNTER — Ambulatory Visit (HOSPITAL_BASED_OUTPATIENT_CLINIC_OR_DEPARTMENT_OTHER): Payer: Medicare (Managed Care) | Attending: Internal Medicine | Admitting: Internal Medicine

## 2024-09-10 DIAGNOSIS — G4733 Obstructive sleep apnea (adult) (pediatric): Secondary | ICD-10-CM

## 2024-09-10 DIAGNOSIS — R0681 Apnea, not elsewhere classified: Secondary | ICD-10-CM | POA: Diagnosis not present

## 2024-09-10 DIAGNOSIS — R400 Somnolence: Secondary | ICD-10-CM | POA: Diagnosis not present

## 2024-09-10 DIAGNOSIS — R0683 Snoring: Secondary | ICD-10-CM | POA: Diagnosis not present

## 2024-09-12 ENCOUNTER — Telehealth: Payer: Self-pay | Admitting: *Deleted

## 2024-09-12 DIAGNOSIS — Z13820 Encounter for screening for osteoporosis: Secondary | ICD-10-CM

## 2024-09-12 DIAGNOSIS — Z780 Asymptomatic menopausal state: Secondary | ICD-10-CM

## 2024-09-12 NOTE — Telephone Encounter (Signed)
 done

## 2024-09-12 NOTE — Telephone Encounter (Signed)
 Copied from CRM [id number]. Topic: Clinical - Lab/Test Results >> Sep 11, 2024  1:56 PM Berwyn MATSU wrote: Reason for CRM: Patient called in requesting results for her mammogram that was done on 08/22/24.   May you please assist.

## 2024-09-13 NOTE — Procedures (Signed)
 [facility] [address] [place], [place] [address] Tel: [phone]   Fax: [phone]  Home Sleep Test Interpretation  Patient Name: Renee Murray, Renee Murray Study Date: 09/10/2024  Date of Birth: Apr 13, 1956 Study Type: HST  Age: 68 year MRN #: [id number]  Sex: Female Interpreting Physician: NEYSA RAMA, [id number]  Height: 4' 11 Referring Physician: BARNIE LOUDER [phone])  Weight: 154.0 lbs Recording Tech: Will Poet RRT RPSGT RST  BMI: 31.5 Scoring Tech: Will Poet RRT RPSGT RST  ESS: 10 Neck Size: 13.5  %%startinterp%% %%startinterp%% Indications for Polysomnography The patient is a 68 year old Female who is 4' 11 and weighs 154.0 lbs. Her BMI equals 31.5.  A home sleep apnea test was performed to evaluate for -OSA  Medication  No Data.   Polysomnogram Data A home sleep test recorded the standard physiologic parameters including EKG, nasal and oral airflow.  Respiratory parameters of chest and abdominal movements were recorded with Respiratory Inductance Plethysmography belts.  Oxygen saturation was recorded by pulse oximetry.   Study Architecture The total recording time of the polysomnogram was 541.5 minutes. The total monitoring time was 542.0 minutes.  Time spent in Supine position was 434.5 minutes.   Respiratory Events The study revealed a presence of 31 obstructive, 6 centrals, and - mixed apneas resulting in an Apnea index of 4.1 events per hour.  There were 20 hypopneas (>=3% desaturation and/or arousal) resulting in an Apnea\Hypopnea Index (AHI >=3% desaturation and/or arousal) of 6.3 events per hour.  There were 6 hypopneas (>=4% desaturation) resulting in an Apnea\Hypopnea Index (AHI >=4% desaturation) of 4.8 events per hour.  There were - Respiratory Effort Related Arousals resulting in a RERA index of - events per hour. The Respiratory Disturbance Index is 6.3 events per hour.  The snore index was - events per  hour.  Mean oxygen saturation was 94.9%.  The lowest oxygen saturation during monitoring time was 89.0%.  Time spent <=88% oxygen saturation was - minutes (-).  Cardiac Summary The average pulse rate was 70.7 bpm.  The minimum pulse rate was 38.0 bpm while the maximum pulse rate was 101.0 bpm.    Comments: Occasional apneas and hypopneas, within normal limits, AHI(4%) 4.8/hr. Snoring with oxygen desaturation to a nadir of 89%, mean 94.9%.  Diagnosis:  Normal study  Recommendations: Encourage normal body weight, sleep position off back.   This study was personally reviewed and electronically signed by: Dr. RAMA JONETTA NEYSA Accredited Board Certified in Sleep Medicine Date/Time: 09/13/24   12:47    %%endinterp%% %%endinterp%%    Study Overview  Recording Time: 690.6 min. Monitoring Time: 542.0 min.  Analysis Start:  07:58:26 PM Supine Time: 434.5 min.  Analysis Stop:  04:59:56 AM     Study Summary   Count Index Longest Event Duration  Apneas & Hypopneas: 57 6.3  Apneas: 23.1 sec.     Hypopneas: 36.9 sec.  RERAs: - - - sec.  Desaturations: 54 6.0 138.0 sec.  Snores: - - - sec.    Minimum Oxygen Saturation: 89.0%    Respiratory Summary   Total Duration Supine Non-Supine   Count Index Average Longest Count Index Count Index  Obstructive Apnea 31 3.4 13.2 23.1 27 3.7 4 2.2   Mixed Apnea - - - - - - - -   Central Apnea 6 0.7 14.9 18.2 6 0.8 - -   Total Apneas 37 4.1 13.5 23.1 33 4.6 4 2.2  Hypopneas 3% 20 2.2 N.A. N.A. 15 2.1 5 2.8   Apneas & Hyp. 3% 57 6.3 N.A. N.A. 48 6.6 9 5.0            Hypopneas 4% 6 0.7 N.A. N.A. 5 0.7 1 0.6  Apneas & Hyp. 4% 43 4.8 N.A. N.A. 38 5.2 5 2.8             RERAs - - - - - - - -  RDI 58 6.4 N.A. N.A. 48 6.6 10 5.6   Oxygen Saturation Summary   Total Supine Non-Supine  Average SpO2 94.9% 94.7% 95.9%  Minimum SpO2 89.0% 89.0% 91.0%   Maximum SpO2 100.0% 99.0% 100.0%   Oxygen Saturation Distribution  Range (%) Time in  range (min) Time in range (%)  90.0 - 100.0 525.0 99.9%  80.0 - 90.0 0.4 0.1%  70.0 - 80.0 - -  60.0 - 70.0 - -  50.0 - 60.0 - -  0.0 - 50.0 - -  Time Spent <=88% SpO2  Range (%) Time in range (min) Time in range (%)  0.0 - 88.0 - -  Cardiac Summary   Total Supine Non-Supine  Average Pulse Rate (BPM) 70.7 69.4 76.7  Minimum Pulse Rate (BPM) 38.0 60.0 38.0  Maximum Pulse Rate (BPM) 101.0 84.0 101.0                      Technologist Comments  -                        Reggy Neysa Bateman, Biomedical engineer of Sleep Medicine  ELECTRONICALLY SIGNED ON:  09/13/2024, 12:48 PM [facility] PH: [phone]   FX: [phone] ACCREDITED BY THE [employer]ICAN ACADEMY OF SLEEP MEDICINE

## 2024-09-13 NOTE — Procedures (Signed)
%%  startinterp%% Indications for Polysomnography The patient is a 68 year old Female who is 4' 11 and weighs 154.0 lbs. Her BMI equals 31.5.  A home sleep apnea test was performed to evaluate for -.  MedicationNo Data. Polysomnogram Data A home sleep test recorded the standard physiologic parameters including EKG, nasal and oral airflow.  Respiratory parameters of chest and abdominal movements were recorded with Respiratory Inductance Plethysmography belts.  Oxygen saturation was  recorded by pulse oximetry.  Study Architecture The total recording time of the polysomnogram was 541.5 minutes. The total monitoring time was 542.0 minutes.  Time spent in Supine position was 434.5 minutes.  Respiratory Events The study revealed a presence of 31 obstructive, 6 centrals, and - mixed apneas resulting in an Apnea index of 4.1 events per hour.  There were 20 hypopneas (GreaterEqual to3% desaturation and/or arousal) resulting in an Apnea\Hypopnea Index (AHI  GreaterEqual to3% desaturation and/or arousal) of 6.3 events per hour.  There were 6 hypopneas (GreaterEqual to4% desaturation) resulting in an Apnea\Hypopnea Index (AHI GreaterEqual to4% desaturation) of 4.8 events per hour.  There were - Respiratory  Effort Related Arousals resulting in a RERA index of - events per hour. The Respiratory Disturbance Index is 6.3 events per hour.  The snore index was - events per hour.  Mean oxygen saturation was 94.9%.  The lowest oxygen saturation during monitoring time was 89.0%.  Time spent LessEqual to88% oxygen saturation was  minutes ().  Cardiac Summary The average pulse rate was 70.7 bpm.  The minimum pulse rate was 38.0 bpm while the maximum pulse rate was 101.0 bpm.  Cardiac rhythm was normal/abnormal.  Comments:  Diagnosis:  Recommendations:   This study was personally reviewed and electronically signed by: Dr. Reggy JONETTA Salt Accredited Board Certified in Sleep Medicine Date/Time:

## 2024-09-14 ENCOUNTER — Telehealth: Payer: Self-pay | Admitting: Internal Medicine

## 2024-09-14 NOTE — Telephone Encounter (Signed)
 Let pt know that sleep study did not show sleep apnea. Did show occasional drop in oxygen level. Try to avoid sleeping on her back.

## 2024-09-15 NOTE — Telephone Encounter (Signed)
 Called & spoke to the patient. Verified name & DOB. Informed of results & recommendations. Patient expressed verbal understanding.

## 2024-10-09 ENCOUNTER — Encounter: Payer: Self-pay | Admitting: Internal Medicine

## 2024-10-09 ENCOUNTER — Ambulatory Visit: Payer: Medicare (Managed Care) | Attending: Internal Medicine | Admitting: Internal Medicine

## 2024-10-09 VITALS — BP 133/74 | HR 68 | Temp 98.0°F | Ht 59.0 in | Wt 150.0 lb

## 2024-10-09 DIAGNOSIS — E785 Hyperlipidemia, unspecified: Secondary | ICD-10-CM | POA: Diagnosis not present

## 2024-10-09 DIAGNOSIS — I10 Essential (primary) hypertension: Secondary | ICD-10-CM

## 2024-10-09 DIAGNOSIS — G5602 Carpal tunnel syndrome, left upper limb: Secondary | ICD-10-CM

## 2024-10-09 DIAGNOSIS — F322 Major depressive disorder, single episode, severe without psychotic features: Secondary | ICD-10-CM | POA: Insufficient documentation

## 2024-10-09 DIAGNOSIS — Z5320 Procedure and treatment not carried out because of patient's decision for unspecified reasons: Secondary | ICD-10-CM | POA: Insufficient documentation

## 2024-10-09 DIAGNOSIS — F419 Anxiety disorder, unspecified: Secondary | ICD-10-CM | POA: Diagnosis not present

## 2024-10-09 DIAGNOSIS — E1159 Type 2 diabetes mellitus with other circulatory complications: Secondary | ICD-10-CM

## 2024-10-09 DIAGNOSIS — R252 Cramp and spasm: Secondary | ICD-10-CM

## 2024-10-09 DIAGNOSIS — F330 Major depressive disorder, recurrent, mild: Secondary | ICD-10-CM

## 2024-10-09 MED ORDER — SERTRALINE HCL 50 MG PO TABS: 50.0000 mg | ORAL_TABLET | Freq: Every day | ORAL | 4 refills | Status: AC

## 2024-10-09 NOTE — Patient Instructions (Addendum)
 Call [facility] for your chronic right shoulder pain  Ph#:   [phone] Address: [address] [place], [place] [address]   VISIT SUMMARY: During your visit today, we discussed your blood pressure, anxiety, cholesterol management, carpal tunnel syndrome, and foot discomfort. Your blood pressure is well-controlled, and we made some adjustments to your anxiety medication. We also addressed your concerns about cholesterol medication and discussed your carpal tunnel syndrome and foot cramps.  YOUR PLAN: -DEPRESSION AND ANXIETY: Anxiety and depression are mental health conditions that can affect your mood and overall well-being. Since your anxiety symptoms persist despite taking Zoloft  25 mg daily, we will increase your dose to 50 mg daily. Please take two 25 mg tablets until your current supply is exhausted. An updated prescription for 50 mg Zoloft  has been sent to your pharmacy.  -LEFT CARPAL TUNNEL SYNDROME: Carpal tunnel syndrome is a condition that causes pain, numbness, and tingling in the hand and arm. Your symptoms have increased, particularly at night. We will refer you to Dr. Patria in orthopedics for further evaluation and management.  -HYPERTENSION: Hypertension, or high blood pressure, is a condition where the force of the blood against your artery walls is too high. Your blood pressure is well-controlled with valsartan  40 mg and amlodipine . Please continue taking these medications as prescribed, monitor your blood pressure at home twice daily, and maintain your dietary salt restriction.  -HYPERLIPIDEMIA: Hyperlipidemia is a condition where there are high levels of fats (lipids) in your blood. You have declined rosuvastatin  due to concerns about side effects, so we will remove it from your medication list. We will recheck your cholesterol levels in one year.  -MUSCLE CRAMPS OF FEET: Muscle cramps are sudden, involuntary contractions of one or more muscles. You experience intermittent cramps in  your feet, often triggered by cold exposure or wearing high heels. Please monitor the duration of these cramps and try to walk them out when they occur. Your current medications are unlikely to cause these cramps.  INSTRUCTIONS: Please follow up with Dr. Patria in orthopedics for your carpal tunnel syndrome. Continue monitoring your blood pressure at home twice daily and maintain your dietary salt restriction. We will recheck your cholesterol levels in one year.                      Contains text generated by [employer].                                 Contains text generated by [employer]bridge.

## 2024-10-09 NOTE — Progress Notes (Addendum)
 Patient ID: Renee Murray, female    DOB: [birth year]  MRN: [id number]  CC: Diabetes (DM & HTN f/u. Thompson possible increase of sertraline  - still experiencing anxiety /Unable to sleep due to carpal tunnel on L hand /Suspects Tardive dyskinesia of bilateral feet)   Subjective: Renee Murray is a 68 y.o. female who presents for chronic ds management. Her concerns today include:  DM, HTN, HL (declines statin 09/2024) SAH (aneurysm) 2016, tob dep, chronic pain, mod/sev CTS RT s/p release,   Discussed the use of AI scribe software for clinical note transcription with the patient, who gave verbal consent to proceed.  History of Present Illness Renee Murray is a 68 year old female with hypertension and anxiety who presents for a two-month follow-up visit.  HTN: Her blood pressure has improved since starting valsartan  40 mg in addition to amlodipine  10 mg. She checks her blood pressure twice daily, and it has been consistently within the target range, often below 130/80 mmHg. She is also limiting salt intake in her diet.  HL: She was prescribed rosuvastatin  for cholesterol management but decided not to take it due to concerns about side effects. She has not started any alternative cholesterol-lowering medication.  MDD: She is currently taking Zoloft  25 mg daily for anxiety related to family stress but feels that her anxiety symptoms persist. While there are moments of calm, there are also days when she feels no change. Her depression symptoms have improved, as indicated by a lower depression screen score compared to two months ago. However her GAD-7 score increased from 3 to 8.  She experiences carpal tunnel syndrome symptoms in her left hand, including increased pain and numbness at night. Dx with BL CTS in past and had CTS surgery release on RT and was suppose to go back to have the LT done but delayed in going back. Symptoms now more bothersome and frequent. Would like to get back to  ortho Dr. Jerri. She has had a nerve conduction study in 2021, which showed moderate to severe right median nerve entrapment but did not mention the left hand.   She describes episodes of discomfort in her feet, characterized by a sensation of her toes spreading or curling under, particularly when her feet are cold or wet. Also occurs when she takes off high heel shoes. These episodes occur several times a month and are not associated with pain or discoloration. She has not timed the duration of these episodes. Daughter has tardive dyskinesia and told her that she may have it too.     Patient Active Problem List   Diagnosis Date Noted   Moderately severe major depression (HCC) 10/09/2024   Statin declined 10/09/2024   TIA (transient ischemic attack) 09/05/2023   Thrombocytopenia 09/05/2023   Brain aneurysm 09/05/2023   Obesity (BMI 30-39.9) 09/05/2023   Positive colorectal cancer screening using Cologuard test 11/18/2021   Type 2 diabetes mellitus without complication, without long-term current use of insulin (HCC) 07/19/2021   Cellulitis of left lower extremity 06/01/2021   Acute back pain with sciatica, right 05/31/2021   Dermatitis 05/31/2021   Right sciatic nerve pain 05/30/2021   Rash and nonspecific skin eruption 05/30/2021   Acute midline low back pain 12/27/2016   Acute pain of both shoulders 12/27/2016   Rotator cuff impingement syndrome 12/27/2016   Essential hypertension 01/25/2016   Chronic pain syndrome 01/25/2016   Prediabetes 10/13/2015   Anxiety about health 09/13/2015   Neuropathy 09/13/2015   Chronic generalized  pain 09/13/2015   History of seasonal allergies 09/13/2015   SAH (subarachnoid hemorrhage) (HCC)    Subarachnoid hemorrhage (HCC) 02/04/2015   History of sinus disease on CT 08/10/2014   Hypertension, goal below 150/90 03/17/2014   Health care maintenance 03/17/2014   Tobacco abuse 03/17/2014   Neuropathy, upper extremity bilaterally  08/06/2013      Current Outpatient Medications on File Prior to Visit  Medication Sig Dispense Refill   albuterol  (VENTOLIN  HFA) 108 (90 Base) MCG/ACT inhaler Inhale 1-2 puffs into the lungs every 6 (six) hours as needed for wheezing or shortness of breath. 18 g 0   amLODipine  (NORVASC ) 10 MG tablet TAKE 1 TABLET DAILY TO LOWER BLOOD PRESSURE 90 tablet 1   Blood Glucose Monitoring Suppl (ONETOUCH VERIO) w/Device KIT Use to test blood sugar once daily. E11.9 1 kit 0   cetirizine  (ZYRTEC ) 10 MG tablet Take 1 tablet (10 mg total) by mouth daily. 90 tablet 1   fluticasone  (FLONASE ) 50 MCG/ACT nasal spray Place 1 spray into both nostrils daily. (Patient taking differently: Place 1 spray into both nostrils daily as needed for allergies.) 16 g 0   gabapentin  (NEURONTIN ) 300 MG capsule Take 1 capsule (300 mg total) by mouth 3 (three) times daily. 270 capsule 1   glucose blood (ONETOUCH VERIO) test strip Use to check blood sugar once daily. E11.9 100 each 6   Lancet Devices (ONETOUCH DELICA PLUS LANCING) MISC Use to check blood sugar once daily. E11.9 1 each 6   OneTouch Delica Lancets 33G MISC Test blood sugars twice daily. E11.9 100 each 11   trolamine salicylate (ASPERCREME) 10 % cream Apply 1 application topically as needed for muscle pain (or sciatica).      valsartan  (DIOVAN ) 40 MG tablet Take 1 tablet (40 mg total) by mouth daily. 90 tablet 3   No current facility-administered medications on file prior to visit.    Allergies  Allergen Reactions   Demerol Other (See Comments)    I almost died   Morphine And Codeine  Other (See Comments)    Almost Died   Celexa  [Citalopram ]     Headache    Social History   Socioeconomic History   Marital status: Divorced    Spouse name: Not on file   Number of children: 3   Years of education: Not on file   Highest education level: High school graduate  Occupational History   Occupation: retired  Tobacco Use   Smoking status: Some Days    Current packs/day:  0.00    Types: Cigarettes   Smokeless tobacco: Never   Tobacco comments:    reports 1 pack per month  Vaping Use   Vaping status: Never Used  Substance and Sexual Activity   Alcohol use: Yes    Comment: Rarely.   Drug use: No   Sexual activity: Not Currently  Other Topics Concern   Not on file  Social History Narrative   Lives alone   Right handed   Drinks 3-4 cups of caffeine daily   Social Drivers of Health   Financial Resource Strain: Low Risk  (10/09/2024)   Overall Financial Resource Strain (CARDIA)    Difficulty of Paying Living Expenses: Not very hard  Food Insecurity: No Food Insecurity (10/09/2024)   Hunger Vital Sign    Worried About Running Out of Food in the Last Year: Never true    Ran Out of Food in the Last Year: Never true  Transportation Needs: No Transportation Needs (10/09/2024)  PRAPARE - Administrator, Civil Service (Medical): No    Lack of Transportation (Non-Medical): No  Physical Activity: Inactive (10/09/2024)   Exercise Vital Sign    Days of Exercise per Week: 0 days    Minutes of Exercise per Session: 0 min  Stress: Stress Concern Present (10/09/2024)   [employer] of Occupational Health - Occupational Stress Questionnaire    Feeling of Stress: To some extent  Social Connections: Not on file  Intimate Partner Violence: Not At Risk (10/09/2024)   Humiliation, Afraid, Rape, and Kick questionnaire    Fear of Current or Ex-Partner: No    Emotionally Abused: No    Physically Abused: No    Sexually Abused: No    Family History  Problem Relation Age of Onset   Diabetes Mother    Heart failure Mother    Cancer Father    BRCA 1/2 Neg Hx    Breast cancer Neg Hx     Past Surgical History:  Procedure Laterality Date   CEREBRAL ANEURYSM REPAIR  2016   Yasargil clip x2 Model # [id number]  & [phone]   CHOLECYSTECTOMY     CRANIOTOMY Right 02/04/2015   Procedure: RIGHT CRANIOTOMY FOR ANEURYSM ;  Surgeon: Gerldine Maizes, MD;   Location: [facility];  Service: Neurosurgery;  Laterality: Right;   IR GENERIC HISTORICAL  02/04/2015   IR ANGIO INTRA EXTRACRAN SEL COM CAROTID INNOMINATE BILAT MOD SED 02/04/2015 Gerldine Maizes, MD [facility]   IR GENERIC HISTORICAL  02/04/2015   IR ANGIO VERTEBRAL SEL SUBCLAVIAN INNOMINATE UNI R MOD SED 02/04/2015 Gerldine Maizes, MD [facility]   IR GENERIC HISTORICAL  02/04/2015   IR ANGIO VERTEBRAL SEL VERTEBRAL UNI L MOD SED 02/04/2015 Gerldine Maizes, MD [facility]   TOTAL ABDOMINAL HYSTERECTOMY      ROS: Review of Systems Negative except as stated above  PHYSICAL EXAM: BP 133/74   Pulse 68   Temp 98 F (36.7 C) (Oral)   Ht 4' 11 (1.499 m)   Wt 150 lb (68 kg)   SpO2 96%   BMI 30.30 kg/m   Physical Exam  General appearance - alert, well appearing, elderly AAF and in no distress Mental status - normal mood, behavior, speech, dress, motor activity, and thought processes Neurological - Hands: no intrinsic muscle wasting. Grip 5/5 BL. Gross sensation intact. Tinel sign negative on LT Musculoskeletal - DP/PT pulses 3+ BL. Radial and BR pulses 3+     10/09/2024   10:23 AM 08/08/2024    9:37 AM 03/08/2023    3:16 PM  Depression screen PHQ 2/9  Decreased Interest 1 3 1   Down, Depressed, Hopeless 1 3 0  PHQ - 2 Score 2 6 1   Altered sleeping 2 3 0  Tired, decreased energy 2 2 1   Change in appetite 2 2 1   Feeling bad or failure about yourself  0 0 0  Trouble concentrating 1 1 0  Moving slowly or fidgety/restless 1 1 0  Suicidal thoughts 0 0 0  PHQ-9 Score 10 15 3   Difficult doing work/chores Not difficult at all Somewhat difficult       10/09/2024   10:23 AM 08/08/2024    9:38 AM 03/08/2023    3:16 PM 11/18/2021   11:51 AM  GAD 7 : Generalized Anxiety Score  Nervous, Anxious, on Edge 2 1 1 1   Control/stop worrying 2 0 0 0  Worry too much - different things 2 1 0 1  Trouble relaxing  1 1 0 1  Restless 0 0 0 0  Easily annoyed or irritable 1 0 0 0   Afraid - awful might happen 0 0 0 0  Total GAD 7 Score 8 3 1 3   Anxiety Difficulty Somewhat difficult Somewhat difficult           Latest Ref Rng & Units 08/08/2024   10:36 AM 09/05/2023    4:45 AM 03/05/2023    2:48 PM  CMP  Glucose 70 - 99 mg/dL 891  98  86   BUN 8 - 27 mg/dL 12  9  12    Creatinine 0.57 - 1.00 mg/dL 9.32  9.35  9.26   Sodium 134 - 144 mmol/L 141  141  146   Potassium 3.5 - 5.2 mmol/L 4.4  4.2  3.8   Chloride 96 - 106 mmol/L 103  105  108   CO2 20 - 29 mmol/L 24  25  24    Calcium  8.7 - 10.3 mg/dL 9.2  8.9  9.0   Total Protein 6.0 - 8.5 g/dL 6.6  6.8  6.2   Total Bilirubin 0.0 - 1.2 mg/dL <9.7  0.6  <9.7   Alkaline Phos 44 - 121 IU/L 128  102  107   AST 0 - 40 IU/L 19  27  20    ALT 0 - 32 IU/L 17  19  15     Lipid Panel     Component Value Date/Time   CHOL 160 08/08/2024 1036   TRIG 156 (H) 08/08/2024 1036   HDL 45 08/08/2024 1036   CHOLHDL 3.6 08/08/2024 1036   CHOLHDL 3.4 09/05/2023 0445   VLDL 13 09/05/2023 0445   LDLCALC 88 08/08/2024 1036    CBC    Component Value Date/Time   WBC 6.7 08/08/2024 1036   WBC 6.7 09/05/2023 0156   RBC 4.48 08/08/2024 1036   RBC 4.51 09/05/2023 0156   HGB 13.6 08/08/2024 1036   HCT 42.0 08/08/2024 1036   PLT 185 08/08/2024 1036   MCV 94 08/08/2024 1036   MCH 30.4 08/08/2024 1036   MCH 29.5 09/05/2023 0156   MCHC 32.4 08/08/2024 1036   MCHC 32.3 09/05/2023 0156   RDW 14.5 08/08/2024 1036   LYMPHSABS 3.6 09/05/2023 0156   MONOABS 0.4 09/05/2023 0156   EOSABS 0.4 09/05/2023 0156   BASOSABS 0.1 09/05/2023 0156    ASSESSMENT AND PLAN: 1. Hypertension associated with diabetes (HCC) (Primary) Close to goal today with good readings at home - Continue valsartan  40 mg and amlodipine  10 mg daily as prescribed. - Continue monitoring blood pressure at home twice daily.  2. Hyperlipidemia LDL goal <70 3. Statin declined Discussed cardiovascular benefits of statin therapy.  However patient declines and does not  want to take it.  Rosuvastatin  removed from her medication list.  4. Major depressive disorder, recurrent episode, mild Improved but patient feels she would benefit from higher dose of Zoloft .  We will increase the dose to 50 mg daily. - sertraline  (ZOLOFT ) 50 MG tablet; Take 1 tablet (50 mg total) by mouth daily. Dose increase 10/09/24  Dispense: 30 tablet; Refill: 4  5. Anxiety See #4 above  6. Carpal tunnel syndrome, left Will get her back in with orthopedics Dr. Jerri whom she had seen in the past for this. - AMB referral to orthopedics  7. Foot cramps Symptoms seems consistent with muscle cramp in the toes/feet.  I do not think this is a circulation issue as she has good pulses in  both feet.  I do not think this is TD. Advised to avoid things that would trigger it including wearing high-heeled shoes.  It seems symptoms do not occur often enough or last long enough to warrant putting her on muscle relaxant with associated side effects that can occur from use.  Advised to try to get up and walk it out when she gets the cramps.   Patient was given the opportunity to ask questions.  Patient verbalized understanding of the plan and was able to repeat key elements of the plan.   This documentation was completed using Paediatric nurse.  Any transcriptional errors are unintentional.  Orders Placed This Encounter  Procedures   AMB referral to orthopedics     Requested Prescriptions   Signed Prescriptions Disp Refills   sertraline  (ZOLOFT ) 50 MG tablet 30 tablet 4    Sig: Take 1 tablet (50 mg total) by mouth daily. Dose increase 10/09/24    Return in about 4 months (around 02/09/2025).  Barnie Louder, MD, FACP

## 2024-10-13 ENCOUNTER — Telehealth: Payer: Self-pay | Admitting: Internal Medicine

## 2024-10-13 NOTE — Telephone Encounter (Signed)
 Copied from CRM [id number]. Topic: General - Other >> Oct 13, 2024  9:52 AM Rosaria BRAVO wrote:  Reason for CRM: omari from [facility] called to report that dr. Vicci is showing as out of network at [facility] but in network at [facility].

## 2024-10-15 NOTE — Telephone Encounter (Signed)
 I have forwarded this message to our office manager.

## 2024-10-20 ENCOUNTER — Encounter: Payer: Self-pay | Admitting: Radiology

## 2024-10-21 ENCOUNTER — Ambulatory Visit: Payer: Medicare (Managed Care) | Admitting: Orthopaedic Surgery

## 2024-11-11 ENCOUNTER — Encounter: Payer: Self-pay | Admitting: Internal Medicine

## 2024-11-25 ENCOUNTER — Other Ambulatory Visit: Payer: Self-pay | Admitting: Pharmacist

## 2024-11-25 NOTE — Progress Notes (Signed)
 Pharmacy Quality Measure Review  This patient is appearing on a report for the adherence measure for cholesterol (statin) and hypertension (ACEi/ARB) medications this calendar year.   Medication: rosuvastatin  Last fill date: 08/13/24 for 90 day supply  Patient no longer on rosuvastatin . Per PCP visit on 10/09/24, she declines statins due to fear of side effects.  Medication: valsartan  Last fill date: 11/05/24 for 90 day supply  Insurance report was not up to date. No action needed at this time.   Herlene Fleeta Morris, PharmD, JAQUELINE, CPP Clinical Pharmacist [facility] [phone]

## 2024-12-23 ENCOUNTER — Telehealth: Payer: Self-pay | Admitting: Internal Medicine

## 2024-12-23 ENCOUNTER — Ambulatory Visit: Payer: Medicare (Managed Care) | Attending: Internal Medicine

## 2024-12-23 VITALS — BP 152/74 | HR 68 | Ht 59.0 in | Wt 152.0 lb

## 2024-12-23 DIAGNOSIS — Z0000 Encounter for general adult medical examination without abnormal findings: Secondary | ICD-10-CM | POA: Diagnosis not present

## 2024-12-23 DIAGNOSIS — Z124 Encounter for screening for malignant neoplasm of cervix: Secondary | ICD-10-CM

## 2024-12-23 NOTE — Patient Instructions (Signed)
 Ms. Mitschke,  Thank you for taking the time for your Medicare Wellness Visit. I appreciate your continued commitment to your health goals. Please review the care plan we discussed, and feel free to reach out if I can assist you further.  Please note that Annual Wellness Visits do not include a physical exam. Some assessments may be limited, especially if the visit was conducted virtually. If needed, we may recommend an in-person follow-up with your provider.  Ongoing Care Seeing your primary care provider every 3 to 6 months helps us  monitor your health and provide consistent, personalized care.   Referrals If a referral was made during today's visit and you haven't received any updates within two weeks, please contact the referred provider directly to check on the status.  Recommended Screenings:  Health Maintenance  Topic Date Due   Medicare Annual Wellness Visit  Never done   Complete foot exam   Never done   Eye exam for diabetics  Never done   Pap Smear  Never done   Colon Cancer Screening  Never done   COVID-19 Vaccine (3 - 2025-26 season) 08/18/2024   Zoster (Shingles) Vaccine (1 of 2) 08/09/2025*   Hemoglobin A1C  02/08/2025   Yearly kidney function blood test for diabetes  08/08/2025   Yearly kidney health urinalysis for diabetes  08/08/2025   DTaP/Tdap/Td vaccine (2 - Td or Tdap) 07/26/2026   Breast Cancer Screening  08/22/2026   Pneumococcal Vaccine for age over 40  Completed   Osteoporosis screening with Bone Density Scan  Completed   Hepatitis C Screening  Completed   Meningitis B Vaccine  Aged Out  *Topic was postponed. The date shown is not the original due date.       01/18/2024   12:52 PM  Advanced Directives  Does Patient Have a Medical Advance Directive? No  Would patient like information on creating a medical advance directive? No - Patient declined    Vision: Annual vision screenings are recommended for early detection of glaucoma, cataracts, and diabetic  retinopathy. These exams can also reveal signs of chronic conditions such as diabetes and high blood pressure.  Dental: Annual dental screenings help detect early signs of oral cancer, gum disease, and other conditions linked to overall health, including heart disease and diabetes.  Please see the attached documents for additional preventive care recommendations.

## 2024-12-23 NOTE — Telephone Encounter (Signed)
 Copied from CRM [id number]. Topic: General - Other >> Dec 23, 2024 10:45 AM Renee Murray wrote:  Reason for CRM: Patient just changed her insurance to [employer], states they are trying to reach out to Dr Vicci to verify her medications. Patient will call them today and see if she can have them fax their request over.  *also wants it noted that she is currently taking rosuvastatin  (CRESTOR ) 5 MG tablet even though she originally told Dr Vicci she didn't want to take it. Just wants Dr Vicci to know because it will be on the list that [facility] sends over.  Patient can be reached at [phone]

## 2024-12-23 NOTE — Progress Notes (Signed)
 "  Chief Complaint  Patient presents with   Medicare Wellness    SUBSEQUENT     Subjective:   Renee Murray is a 69 y.o. female who presents for a Medicare Annual Wellness Visit.  Visit info / Clinical Intake: Medicare Wellness Visit Type:: Subsequent Annual Wellness Visit Persons participating in visit and providing information:: patient Medicare Wellness Visit Mode:: Telephone If telephone:: video error Since this visit was completed virtually, some vitals may be partially provided or unavailable. Missing vitals are due to the limitations of the virtual format.: Documented vitals are patient reported If Telephone or Video please confirm:: I connected with patient using audio/video enable telemedicine. I verified patient identity with two identifiers, discussed telehealth limitations, and patient agreed to proceed. Patient Location:: HOME Provider Location:: [facility] OFFICE Interpreter Needed?: No Pre-visit prep was completed: yes AWV questionnaire completed by patient prior to visit?: no Living arrangements:: (!) lives alone (HAS ALOT OF FAMILY SUPPORT) Patient's Overall Health Status Rating: good Typical amount of pain: some (ACHINESS) Does pain affect daily life?: (!) yes Are you currently prescribed opioids?: no  Dietary Habits and Nutritional Risks How many meals a day?: 2 (BIG BREAKFAST (EATS LATE)) Eats fruit and vegetables daily?: yes Most meals are obtained by: preparing own meals In the last 2 weeks, have you had any of the following?: none Diabetic:: (!) yes Any non-healing wounds?: no How often do you check your BS?: 1 (PT STATED: 112) Would you like to be referred to a Nutritionist or for Diabetic Management? : no  Functional Status Activities of Daily Living (to include ambulation/medication): Independent Ambulation: Independent with device- listed below Home Assistive Devices/Equipment: Johna Finder (specify Type); Elevated toliet seat; Shower/tub chair; Other  (Comment); Eyeglasses (PULL DOWN SHOWER NOZZLES, GRAB BARS, STEP LADDER TO GET UNTO BED) Medication Administration: Independent Home Management (perform basic housework or laundry): Independent Manage your own finances?: yes Primary transportation is: driving Concerns about vision?: no *vision screening is required for [facility]* (PATIENT GOES TO [facility]) Concerns about hearing?: no  Fall Screening Falls in the past year?: 0 Number of falls in past year: 0 Was there an injury with Fall?: 0 Fall Risk Category Calculator: 0 Patient Fall Risk Level: Low Fall Risk  Fall Risk Patient at Risk for Falls Due to: Impaired balance/gait Fall risk Follow up: Falls evaluation completed; Education provided  Home and Transportation Safety: All rugs have non-skid backing?: N/A, no rugs All stairs or steps have railings?: yes (FRONT ENTRY AND DECK) Grab bars in the bathtub or shower?: yes Have non-skid surface in bathtub or shower?: yes Good home lighting?: yes Regular seat belt use?: yes Hospital stays in the last year:: no  Cognitive Assessment Difficulty concentrating, remembering, or making decisions? : no (I DO TRY TO THINK MORE BEFORE I SPEAK AND NAMES ARE A PROBLEM, BUT NOTHING MAJOR) Will 6CIT or Mini Cog be Completed: yes What year is it?: 0 points What month is it?: 0 points Give patient an address phrase to remember (5 components): [place] [address] About what time is it?: 0 points Count backwards from 20 to 1: 0 points Say the months of the year in reverse: 0 points Repeat the address phrase from earlier: 0 points 6 CIT Score: 0 points  Advance Directives (For Healthcare) Does Patient Have a Medical Advance Directive?: No Would patient like information on creating a medical advance directive?: No - Patient declined  Reviewed/Updated  Reviewed/Updated: Reviewed All (Medical, Surgical, Family, Medications, Allergies, Care Teams,  Patient Goals)    Allergies  (verified) Demerol, Morphine and codeine , and Celexa  [citalopram ]   Current Medications (verified) Outpatient Encounter Medications as of 12/23/2024  Medication Sig   albuterol  (VENTOLIN  HFA) 108 (90 Base) MCG/ACT inhaler Inhale 1-2 puffs into the lungs every 6 (six) hours as needed for wheezing or shortness of breath.   amLODipine  (NORVASC ) 10 MG tablet TAKE 1 TABLET DAILY TO LOWER BLOOD PRESSURE   Blood Glucose Monitoring Suppl (ONETOUCH VERIO) w/Device KIT Use to test blood sugar once daily. E11.9   cetirizine  (ZYRTEC ) 10 MG tablet Take 1 tablet (10 mg total) by mouth daily.   fluticasone  (FLONASE ) 50 MCG/ACT nasal spray Place 1 spray into both nostrils daily. (Patient taking differently: Place 1 spray into both nostrils daily as needed for allergies.)   gabapentin  (NEURONTIN ) 300 MG capsule Take 1 capsule (300 mg total) by mouth 3 (three) times daily.   glucose blood (ONETOUCH VERIO) test strip Use to check blood sugar once daily. E11.9   Lancet Devices (ONETOUCH DELICA PLUS LANCING) MISC Use to check blood sugar once daily. E11.9   OneTouch Delica Lancets 33G MISC Test blood sugars twice daily. E11.9   sertraline  (ZOLOFT ) 50 MG tablet Take 1 tablet (50 mg total) by mouth daily. Dose increase 10/09/24   trolamine salicylate (ASPERCREME) 10 % cream Apply 1 application topically as needed for muscle pain (or sciatica).    valsartan  (DIOVAN ) 40 MG tablet Take 1 tablet (40 mg total) by mouth daily.   No facility-administered encounter medications on file as of 12/23/2024.    History: Past Medical History:  Diagnosis Date   Brain aneurysm    Diabetes mellitus without complication (HCC)    Hypertension    Stroke [facility])    stroke like symptons/admitted 8/21   Past Surgical History:  Procedure Laterality Date   CEREBRAL ANEURYSM REPAIR  2016   Yasargil clip x2 Model # [id number]  & [id number]   CHOLECYSTECTOMY     CRANIOTOMY Right 02/04/2015   Procedure: RIGHT CRANIOTOMY FOR ANEURYSM ;  Surgeon:  Gerldine Maizes, MD;  Location: [facility];  Service: Neurosurgery;  Laterality: Right;   IR GENERIC HISTORICAL  02/04/2015   IR ANGIO INTRA EXTRACRAN SEL COM CAROTID INNOMINATE BILAT MOD SED 02/04/2015 Gerldine Maizes, MD [facility]   IR GENERIC HISTORICAL  02/04/2015   IR ANGIO VERTEBRAL SEL SUBCLAVIAN INNOMINATE UNI R MOD SED 02/04/2015 Gerldine Maizes, MD [facility]   IR GENERIC HISTORICAL  02/04/2015   IR ANGIO VERTEBRAL SEL VERTEBRAL UNI L MOD SED 02/04/2015 Gerldine Maizes, MD [facility]   TOTAL ABDOMINAL HYSTERECTOMY     Family History  Problem Relation Age of Onset   Diabetes Mother    Heart failure Mother    Cancer Father    BRCA 1/2 Neg Hx    Breast cancer Neg Hx    Social History   Occupational History   Occupation: retired  Tobacco Use   Smoking status: Some Days    Current packs/day: 0.00    Types: Cigarettes   Smokeless tobacco: Never   Tobacco comments:    reports 1 pack per month  Vaping Use   Vaping status: Never Used  Substance and Sexual Activity   Alcohol use: Yes    Comment: Rarely.   Drug use: No   Sexual activity: Not Currently   Tobacco Counseling Ready to quit: Not Answered Counseling given: Not Answered Tobacco comments: reports 1 pack per month  SDOH Screenings   Food Insecurity: No  Food Insecurity (12/23/2024)  Housing: Low Risk (12/23/2024)  Transportation Needs: No Transportation Needs (12/23/2024)  Utilities: Not At Risk (12/23/2024)  Alcohol Screen: Low Risk (12/23/2024)  Depression (PHQ2-9): Low Risk (12/23/2024)  Recent Concern: Depression (PHQ2-9) - Medium Risk (10/09/2024)  Financial Resource Strain: Low Risk (12/23/2024)  Physical Activity: Inactive (12/23/2024)  Social Connections: Unknown (12/23/2024)  Stress: No Stress Concern Present (12/23/2024)  Recent Concern: Stress - Stress Concern Present (10/09/2024)  Tobacco Use: High Risk (12/23/2024)  Health Literacy: Adequate Health Literacy (12/23/2024)   See flowsheets for full  screening details  Depression Screen PHQ 2 & 9 Depression Scale- Over the past 2 weeks, how often have you been bothered by any of the following problems? Little interest or pleasure in doing things: 0 Feeling down, depressed, or hopeless (PHQ Adolescent also includes...irritable): 0 PHQ-2 Total Score: 0 Trouble falling or staying asleep, or sleeping too much: 0 Feeling tired or having little energy: 0 Poor appetite or overeating (PHQ Adolescent also includes...weight loss): 0 Feeling bad about yourself - or that you are a failure or have let yourself or your family down: 0 Trouble concentrating on things, such as reading the newspaper or watching television (PHQ Adolescent also includes...like school work): 0 Moving or speaking so slowly that other people could have noticed. Or the opposite - being so fidgety or restless that you have been moving around a lot more than usual: 0 Thoughts that you would be better off dead, or of hurting yourself in some way: 0 PHQ-9 Total Score: 0 If you checked off any problems, how difficult have these problems made it for you to do your work, take care of things at home, or get along with other people?: Not difficult at all  Depression Treatment Depression Interventions/Treatment : EYV7-0 Score <4 Follow-up Not Indicated     Goals Addressed             This Visit's Progress    12/23/2024: I want to leisue walk, spend more time with good friends and take avantage of more senior involvements.               Objective:    Today's Vitals   12/23/24 1116  BP: (!) 152/74  Pulse: 68  Weight: 152 lb (68.9 kg)  Height: 4' 11 (1.499 m)  PainSc: 4   PainLoc: Generalized   Body mass index is 30.7 kg/m.  Hearing/Vision screen No results found. Immunizations and Health Maintenance Health Maintenance  Topic Date Due   FOOT EXAM  Never done   OPHTHALMOLOGY EXAM  Never done   Cervical Cancer Screening (Pap smear)  Never done   Colonoscopy   Never done   COVID-19 Vaccine (3 - 2025-26 season) 08/18/2024   Zoster Vaccines- Shingrix (1 of 2) 08/09/2025 (Originally 03/24/2004)   HEMOGLOBIN A1C  02/08/2025   Diabetic kidney evaluation - eGFR measurement  08/08/2025   Diabetic kidney evaluation - Urine ACR  08/08/2025   Medicare Annual Wellness (AWV)  12/23/2025   DTaP/Tdap/Td (2 - Td or Tdap) 07/26/2026   Mammogram  08/22/2026   Pneumococcal Vaccine: 50+ Years  Completed   Bone Density Scan  Completed   Hepatitis C Screening  Completed   Meningococcal B Vaccine  Aged Out        Assessment/Plan:  This is a routine wellness examination for Harryette.  Patient Care Team: Vicci Barnie NOVAK, MD as PCP - General (Internal Medicine) Renea Carmel, OT (Inactive) as Occupational Therapist (Occupational Therapy)  I have personally  reviewed and noted the following in the patients chart:   Medical and social history Use of alcohol, tobacco or illicit drugs  Current medications and supplements including opioid prescriptions. Functional ability and status Nutritional status Physical activity Advanced directives List of other physicians Hospitalizations, surgeries, and ER visits in previous 12 months Vitals Screenings to include cognitive, depression, and falls Referrals and appointments  No orders of the defined types were placed in this encounter.  In addition, I have reviewed and discussed with patient certain preventive protocols, quality metrics, and best practice recommendations. A written personalized care plan for preventive services as well as general preventive health recommendations were provided to patient.   Roz LOISE Fuller, LPN   07/20/7972   Return in about 1 year (around 12/23/2025) for Medicare wellness.  After Visit Summary: (MyChart) Due to this being a telephonic visit, the after visit summary with patients personalized plan was offered to patient via MyChart   Nurse Notes: Patient aware of current care  gaps.  Eye Exam done at [facility]. "

## 2024-12-23 NOTE — Telephone Encounter (Signed)
 Is she taking Rosuvastatin ?

## 2024-12-24 ENCOUNTER — Telehealth: Payer: Self-pay | Admitting: Internal Medicine

## 2024-12-24 NOTE — Telephone Encounter (Signed)
 Called & spoke to the patient. Verified name & DOB. Patient confirmed that she has been taking Rosuvastatin  since the end of October and daily. Please advise if you have received the fax from [facility]. Thank you.

## 2024-12-24 NOTE — Telephone Encounter (Signed)
 Copied from CRM [id number]. Topic: Clinical - Medical Advice >> Dec 23, 2024  5:12 PM Kevelyn M wrote:  Reason for CRM: Patient would like Dr. vicci to feel out a form that declares chronic illness.   Form ([email or web address]) top right corner select [employer] verfification. [employer] verfification form will appear, then [employer] plan appear.  Call back # [phone]

## 2024-12-24 NOTE — Telephone Encounter (Signed)
I have not received a fax .

## 2024-12-24 NOTE — Telephone Encounter (Signed)
 Duplicate

## 2024-12-26 NOTE — Telephone Encounter (Signed)
 Called & spoke to the patient. Verified name & DOB. Informed that the fax has not been received as of yet. Requested the patient to call [employer] to have them re-fax the form to [facility]. Patient expressed verbal understanding.

## 2025-01-22 ENCOUNTER — Other Ambulatory Visit (HOSPITAL_BASED_OUTPATIENT_CLINIC_OR_DEPARTMENT_OTHER): Payer: Medicare (Managed Care)

## 2025-02-09 ENCOUNTER — Ambulatory Visit: Payer: Medicare (Managed Care) | Admitting: Internal Medicine
